# Patient Record
Sex: Male | Born: 1955 | Race: White | Hispanic: No | Marital: Married | Smoking: Former smoker
Health system: Southern US, Community
[De-identification: ages and names within clinical notes are randomized; demographics above are authoritative.]

## PROBLEM LIST (undated history)

## (undated) DIAGNOSIS — F32A Depression, unspecified: Secondary | ICD-10-CM

## (undated) DIAGNOSIS — Z87891 Personal history of nicotine dependence: Secondary | ICD-10-CM

## (undated) DIAGNOSIS — Z8719 Personal history of other diseases of the digestive system: Secondary | ICD-10-CM

## (undated) DIAGNOSIS — M1990 Unspecified osteoarthritis, unspecified site: Secondary | ICD-10-CM

## (undated) DIAGNOSIS — C44622 Squamous cell carcinoma of skin of right upper limb, including shoulder: Secondary | ICD-10-CM

## (undated) DIAGNOSIS — K219 Gastro-esophageal reflux disease without esophagitis: Secondary | ICD-10-CM

## (undated) DIAGNOSIS — E119 Type 2 diabetes mellitus without complications: Secondary | ICD-10-CM

## (undated) DIAGNOSIS — F329 Major depressive disorder, single episode, unspecified: Secondary | ICD-10-CM

## (undated) DIAGNOSIS — Z87442 Personal history of urinary calculi: Secondary | ICD-10-CM

## (undated) DIAGNOSIS — I779 Disorder of arteries and arterioles, unspecified: Secondary | ICD-10-CM

## (undated) DIAGNOSIS — E785 Hyperlipidemia, unspecified: Secondary | ICD-10-CM

## (undated) DIAGNOSIS — K2270 Barrett's esophagus without dysplasia: Secondary | ICD-10-CM

## (undated) DIAGNOSIS — G4730 Sleep apnea, unspecified: Secondary | ICD-10-CM

## (undated) DIAGNOSIS — I10 Essential (primary) hypertension: Secondary | ICD-10-CM

## (undated) DIAGNOSIS — I2510 Atherosclerotic heart disease of native coronary artery without angina pectoris: Secondary | ICD-10-CM

## (undated) DIAGNOSIS — C44229 Squamous cell carcinoma of skin of left ear and external auricular canal: Secondary | ICD-10-CM

## (undated) DIAGNOSIS — I209 Angina pectoris, unspecified: Secondary | ICD-10-CM

## (undated) DIAGNOSIS — M6208 Separation of muscle (nontraumatic), other site: Secondary | ICD-10-CM

## (undated) HISTORY — PX: CARDIAC CATHETERIZATION: SHX172

## (undated) HISTORY — DX: Hyperlipidemia, unspecified: E78.5

## (undated) HISTORY — DX: Squamous cell carcinoma of skin of right upper limb, including shoulder: C44.622

## (undated) HISTORY — PX: OTHER SURGICAL HISTORY: SHX169

## (undated) HISTORY — DX: Personal history of nicotine dependence: Z87.891

## (undated) HISTORY — PX: CORONARY ANGIOPLASTY: SHX604

## (undated) HISTORY — DX: Morbid (severe) obesity due to excess calories: E66.01

## (undated) HISTORY — DX: Disorder of arteries and arterioles, unspecified: I77.9

## (undated) HISTORY — DX: Unspecified osteoarthritis, unspecified site: M19.90

## (undated) HISTORY — DX: Squamous cell carcinoma of skin of left ear and external auricular canal: C44.229

---

## 1989-10-17 HISTORY — PX: BACK SURGERY: SHX140

## 2008-10-17 HISTORY — PX: OTHER SURGICAL HISTORY: SHX169

## 2009-10-17 HISTORY — PX: CAROTID ARTERY ANGIOPLASTY: SHX1300

## 2017-10-24 ENCOUNTER — Telehealth: Payer: Self-pay

## 2017-10-24 NOTE — Telephone Encounter (Signed)
Patient has upcoming appt 1-29 with Dr. Rockey Situ. Signed ROI for old cardio records in tx.   Faxed to office scanned ROI.

## 2017-10-24 NOTE — Telephone Encounter (Signed)
Thank you :)

## 2017-11-06 DIAGNOSIS — I2510 Atherosclerotic heart disease of native coronary artery without angina pectoris: Secondary | ICD-10-CM | POA: Insufficient documentation

## 2017-11-06 DIAGNOSIS — I779 Disorder of arteries and arterioles, unspecified: Secondary | ICD-10-CM | POA: Insufficient documentation

## 2017-11-06 DIAGNOSIS — M1990 Unspecified osteoarthritis, unspecified site: Secondary | ICD-10-CM | POA: Insufficient documentation

## 2017-11-06 DIAGNOSIS — F329 Major depressive disorder, single episode, unspecified: Secondary | ICD-10-CM | POA: Insufficient documentation

## 2017-11-12 NOTE — Progress Notes (Signed)
Cardiology Office Note  Date:  11/14/2017   ID:  Brandon Henderson, DOB 09/15/1956, MRN [id number]  PCP:  Brandon Ruths, MD   Chief Complaint  Patient presents with  . New Patient (Initial Visit) consultation by Brandon Henderson    Patient c/o Swelling in Hands and left arm and hand numbness and tingling.  Patient denies chest pain and SOB. Meds revewed verbally with patient.     HPI:  Brandon Henderson is a 62 year old gentleman with past medical history of carotid disease, carotid endarterectomy surgery, on the right 2011 Coronary artery disease previous PCI, 2000 [facility], PCI OM2 vessel Depression Osteoarthritis Snoring, witnessed apnea per the wife Former smoker, quit 3 yrs ago,  Borderline diabetes Who presents by referral from Brandon Henderson for consultation of his coronary artery disease  Long discussion concerning his previous cardiac history Previous stress tests,   Discussed previous anginal symptoms in 2000, had left-sided chest pain Resolved after stent placed to OM 2 vessel  Had carotid endarterectomy surgery in 6712, no complications  Currently denies any chest pain on exertion, active at work Main complaint is tingling numbness left arm after day of repetitive movement at work lifting heavy items Thinks he may have nerve compression and neck  Lab work reviewed with him total cholesterol 177, LDL 89 in August 2018 Diet hit or miss HBA1C 5.9  EKG personally reviewed by myself on todays visit Shows normal sinus rhythm with rate 61 bpm no significant ST or T wave changes   PMH:   has no past medical history on file.  PSH:    Past Surgical History:  Procedure Laterality Date  . BACK SURGERY  1991  . CAROTID ARTERY ANGIOPLASTY  2011  . Rotator cuff  2010    Current Outpatient Medications  Medication Sig Dispense Refill  . aspirin EC 81 MG tablet Take 81 mg by mouth daily.    Marland Kitchen atorvastatin (LIPITOR) 40 MG tablet Take 1 tablet (40 mg total) by  mouth daily. 90 tablet 3  . escitalopram (LEXAPRO) 20 MG tablet Take 20 mg by mouth daily.    Marland Kitchen glucosamine-chondroitin 500-400 MG tablet Take 1 tablet by mouth 2 (two) times daily.    . magnesium oxide (MAG-OX) 400 MG tablet Take 500 mg by mouth 2 (two) times daily.    . meloxicam (MOBIC) 15 MG tablet Take 15 mg by mouth daily as needed.  3  . ramipril (ALTACE) 5 MG capsule Take 1 capsule (5 mg total) by mouth daily. 90 capsule 3  . ezetimibe (ZETIA) 10 MG tablet Take 1 tablet (10 mg total) by mouth daily. 90 tablet 3   No current facility-administered medications for this visit.      Allergies:   Patient has no allergy information on record.   Social History:  The patient  reports that he has quit smoking. he has never used smokeless tobacco. He reports that he drinks alcohol. He reports that he does not use drugs.   Family History:   family history is not on file.    Review of Systems: Review of Systems  Constitutional: Negative.   Respiratory: Negative.   Cardiovascular: Negative.   Gastrointestinal: Negative.   Musculoskeletal: Negative.        Left arm tingling and discomfort  Neurological: Negative.   Psychiatric/Behavioral: Negative.   All other systems reviewed and are negative.    PHYSICAL EXAM: VS:  BP 120/74 (BP Location: Left Arm, Patient Position: Sitting, Cuff Size: Normal)  Pulse 61   Ht 5\' 7"  (1.702 m)   Wt 234 lb (106.1 kg)   BMI 36.65 kg/m  , BMI Body mass index is 36.65 kg/m. GEN: Well nourished, well developed, in no acute distress obese,  HEENT: normal  Neck: no JVD, carotid bruits, or masses Cardiac: RRR; no murmurs, rubs, or gallops,no edema  Respiratory:  clear to auscultation bilaterally, normal work of breathing GI: soft, nontender, nondistended, + BS MS: no deformity or atrophy  Skin: warm and dry, no rash Neuro:  Strength and sensation are intact Psych: euthymic mood, full affect    Recent Labs: No results found for requested labs  within last 8760 hours.    Lipid Panel No results found for: CHOL, HDL, LDLCALC, TRIG    Wt Readings from Last 3 Encounters:  11/14/17 234 lb (106.1 kg)       ASSESSMENT AND PLAN:  Coronary artery disease of native artery of native heart with stable angina pectoris [facility]) Records reviewed with him in detailCurrently with no symptoms of angina. No further workup at this time. Continue current medication regimen.  Stable  Stenosis of right carotid artery - Plan: EKG 12-Lead In follow-up will help arrange carotid ultrasound Stable disease following carotid endarterectomy in 2000  Mixed hyperlipidemia Numbers above goal 177 cholesterol August 2018 Recommend he stay on Lipitor and start Zetia 10 mg daily  Morbid obesity ([place]) We have encouraged continued exercise, careful diet management in an effort to lose weight.  Elevated glucose Recommended low carbohydrate diet, weight loss, exercise  PAD (peripheral artery disease) (HCC) Carotid disease, stressed importance of weight loss, low sugars, LDL less than 70 if not lower   Total encounter time more than 60 minutes  Greater than 50% was spent in counseling and coordination of care with the patient   Disposition:   F/U  6 months   Orders Placed This Encounter  Procedures  . EKG 12-Lead     Signed, Esmond Plants, M.D., Ph.D. 11/14/2017  [place], [place]

## 2017-11-14 ENCOUNTER — Encounter: Payer: Self-pay | Admitting: Cardiovascular Disease

## 2017-11-14 ENCOUNTER — Ambulatory Visit: Payer: BLUE CROSS/BLUE SHIELD | Admitting: Cardiovascular Disease

## 2017-11-14 VITALS — BP 120/74 | HR 61 | Ht 67.0 in | Wt 234.0 lb

## 2017-11-14 DIAGNOSIS — Z23 Encounter for immunization: Secondary | ICD-10-CM

## 2017-11-14 DIAGNOSIS — I25118 Atherosclerotic heart disease of native coronary artery with other forms of angina pectoris: Secondary | ICD-10-CM

## 2017-11-14 DIAGNOSIS — I739 Peripheral vascular disease, unspecified: Secondary | ICD-10-CM | POA: Diagnosis not present

## 2017-11-14 DIAGNOSIS — E782 Mixed hyperlipidemia: Secondary | ICD-10-CM | POA: Diagnosis not present

## 2017-11-14 DIAGNOSIS — R7309 Other abnormal glucose: Secondary | ICD-10-CM | POA: Diagnosis not present

## 2017-11-14 DIAGNOSIS — I6521 Occlusion and stenosis of right carotid artery: Secondary | ICD-10-CM

## 2017-11-14 DIAGNOSIS — I2510 Atherosclerotic heart disease of native coronary artery without angina pectoris: Secondary | ICD-10-CM | POA: Insufficient documentation

## 2017-11-14 MED ORDER — EZETIMIBE 10 MG PO TABS: 10.0000 mg | ORAL_TABLET | Freq: Every day | ORAL | 3 refills | Status: DC

## 2017-11-14 MED ORDER — ATORVASTATIN CALCIUM 40 MG PO TABS: 40.0000 mg | ORAL_TABLET | Freq: Every day | ORAL | 3 refills | Status: DC

## 2017-11-14 MED ORDER — RAMIPRIL 5 MG PO CAPS: 5.0000 mg | ORAL_CAPSULE | Freq: Every day | ORAL | 3 refills | Status: DC

## 2017-11-14 NOTE — Patient Instructions (Signed)
Medication Instructions:   Please start zetia one a day for cholesterol  Labwork:  No new labs needed  Testing/Procedures:  No further testing at this time   Follow-Up: It was a pleasure seeing you in the office today. Please call us if you have new issues that need to be addressed before your next appt.  [phone]  Your physician wants you to follow-up in: 6 months.  You will receive a reminder letter in the mail two months in advance. If you don't receive a letter, please call our office to schedule the follow-up appointment.  If you need a refill on your cardiac medications before your next appointment, please call your pharmacy.

## 2017-11-23 DIAGNOSIS — M25561 Pain in right knee: Secondary | ICD-10-CM | POA: Diagnosis not present

## 2017-11-23 DIAGNOSIS — G8929 Other chronic pain: Secondary | ICD-10-CM | POA: Diagnosis not present

## 2017-11-23 DIAGNOSIS — M25512 Pain in left shoulder: Secondary | ICD-10-CM | POA: Diagnosis not present

## 2017-12-22 DIAGNOSIS — E119 Type 2 diabetes mellitus without complications: Secondary | ICD-10-CM | POA: Insufficient documentation

## 2017-12-22 DIAGNOSIS — R252 Cramp and spasm: Secondary | ICD-10-CM | POA: Diagnosis not present

## 2017-12-22 DIAGNOSIS — M545 Low back pain: Secondary | ICD-10-CM | POA: Diagnosis not present

## 2017-12-22 DIAGNOSIS — R739 Hyperglycemia, unspecified: Secondary | ICD-10-CM | POA: Diagnosis not present

## 2017-12-22 DIAGNOSIS — R5383 Other fatigue: Secondary | ICD-10-CM | POA: Diagnosis not present

## 2018-01-04 DIAGNOSIS — R0683 Snoring: Secondary | ICD-10-CM | POA: Diagnosis not present

## 2018-01-04 DIAGNOSIS — G4710 Hypersomnia, unspecified: Secondary | ICD-10-CM | POA: Diagnosis not present

## 2018-01-06 DIAGNOSIS — G4733 Obstructive sleep apnea (adult) (pediatric): Secondary | ICD-10-CM | POA: Diagnosis not present

## 2018-03-22 DIAGNOSIS — D0439 Carcinoma in situ of skin of other parts of face: Secondary | ICD-10-CM | POA: Diagnosis not present

## 2018-03-22 DIAGNOSIS — D225 Melanocytic nevi of trunk: Secondary | ICD-10-CM | POA: Diagnosis not present

## 2018-03-22 DIAGNOSIS — D2262 Melanocytic nevi of left upper limb, including shoulder: Secondary | ICD-10-CM | POA: Diagnosis not present

## 2018-03-22 DIAGNOSIS — D485 Neoplasm of uncertain behavior of skin: Secondary | ICD-10-CM | POA: Diagnosis not present

## 2018-03-22 DIAGNOSIS — D2261 Melanocytic nevi of right upper limb, including shoulder: Secondary | ICD-10-CM | POA: Diagnosis not present

## 2018-03-22 DIAGNOSIS — D2272 Melanocytic nevi of left lower limb, including hip: Secondary | ICD-10-CM | POA: Diagnosis not present

## 2018-03-28 DIAGNOSIS — G8929 Other chronic pain: Secondary | ICD-10-CM | POA: Diagnosis not present

## 2018-03-28 DIAGNOSIS — M25561 Pain in right knee: Secondary | ICD-10-CM | POA: Diagnosis not present

## 2018-03-28 DIAGNOSIS — M6208 Separation of muscle (nontraumatic), other site: Secondary | ICD-10-CM | POA: Diagnosis not present

## 2018-03-28 DIAGNOSIS — Z1211 Encounter for screening for malignant neoplasm of colon: Secondary | ICD-10-CM | POA: Diagnosis not present

## 2018-04-11 DIAGNOSIS — J40 Bronchitis, not specified as acute or chronic: Secondary | ICD-10-CM | POA: Diagnosis not present

## 2018-04-27 DIAGNOSIS — Z800 Family history of malignant neoplasm of digestive organs: Secondary | ICD-10-CM | POA: Diagnosis not present

## 2018-04-27 DIAGNOSIS — R1013 Epigastric pain: Secondary | ICD-10-CM | POA: Diagnosis not present

## 2018-04-27 DIAGNOSIS — K219 Gastro-esophageal reflux disease without esophagitis: Secondary | ICD-10-CM | POA: Diagnosis not present

## 2018-06-11 ENCOUNTER — Encounter: Payer: Self-pay | Admitting: *Deleted

## 2018-06-12 ENCOUNTER — Ambulatory Visit
Admission: RE | Admit: 2018-06-12 | Discharge: 2018-06-12 | Disposition: A | Discharge status: Home / Self Care | Payer: BLUE CROSS/BLUE SHIELD | Source: Ambulatory Visit | Attending: Internal Medicine | Admitting: Internal Medicine

## 2018-06-12 ENCOUNTER — Encounter
Admission: RE | Disposition: A | Discharge status: Home / Self Care | Payer: Self-pay | Source: Ambulatory Visit | Attending: Internal Medicine

## 2018-06-12 ENCOUNTER — Ambulatory Visit: Payer: BLUE CROSS/BLUE SHIELD | Admitting: Anesthesiology

## 2018-06-12 ENCOUNTER — Encounter: Payer: Self-pay | Admitting: *Deleted

## 2018-06-12 DIAGNOSIS — I2510 Atherosclerotic heart disease of native coronary artery without angina pectoris: Secondary | ICD-10-CM | POA: Insufficient documentation

## 2018-06-12 DIAGNOSIS — G4730 Sleep apnea, unspecified: Secondary | ICD-10-CM | POA: Insufficient documentation

## 2018-06-12 DIAGNOSIS — K3189 Other diseases of stomach and duodenum: Secondary | ICD-10-CM | POA: Diagnosis not present

## 2018-06-12 DIAGNOSIS — K5730 Diverticulosis of large intestine without perforation or abscess without bleeding: Secondary | ICD-10-CM | POA: Diagnosis not present

## 2018-06-12 DIAGNOSIS — Z7982 Long term (current) use of aspirin: Secondary | ICD-10-CM | POA: Insufficient documentation

## 2018-06-12 DIAGNOSIS — K210 Gastro-esophageal reflux disease with esophagitis: Secondary | ICD-10-CM | POA: Insufficient documentation

## 2018-06-12 DIAGNOSIS — Z87891 Personal history of nicotine dependence: Secondary | ICD-10-CM | POA: Diagnosis not present

## 2018-06-12 DIAGNOSIS — R1013 Epigastric pain: Secondary | ICD-10-CM | POA: Diagnosis not present

## 2018-06-12 DIAGNOSIS — K9289 Other specified diseases of the digestive system: Secondary | ICD-10-CM | POA: Diagnosis not present

## 2018-06-12 DIAGNOSIS — K219 Gastro-esophageal reflux disease without esophagitis: Secondary | ICD-10-CM | POA: Diagnosis present

## 2018-06-12 DIAGNOSIS — Z1211 Encounter for screening for malignant neoplasm of colon: Secondary | ICD-10-CM | POA: Insufficient documentation

## 2018-06-12 DIAGNOSIS — Z800 Family history of malignant neoplasm of digestive organs: Secondary | ICD-10-CM | POA: Insufficient documentation

## 2018-06-12 DIAGNOSIS — Z79899 Other long term (current) drug therapy: Secondary | ICD-10-CM | POA: Insufficient documentation

## 2018-06-12 DIAGNOSIS — K2970 Gastritis, unspecified, without bleeding: Secondary | ICD-10-CM | POA: Diagnosis not present

## 2018-06-12 DIAGNOSIS — K449 Diaphragmatic hernia without obstruction or gangrene: Secondary | ICD-10-CM | POA: Diagnosis not present

## 2018-06-12 DIAGNOSIS — K2960 Other gastritis without bleeding: Secondary | ICD-10-CM | POA: Diagnosis not present

## 2018-06-12 DIAGNOSIS — K228 Other specified diseases of esophagus: Secondary | ICD-10-CM | POA: Insufficient documentation

## 2018-06-12 DIAGNOSIS — F329 Major depressive disorder, single episode, unspecified: Secondary | ICD-10-CM | POA: Insufficient documentation

## 2018-06-12 DIAGNOSIS — Z791 Long term (current) use of non-steroidal anti-inflammatories (NSAID): Secondary | ICD-10-CM | POA: Insufficient documentation

## 2018-06-12 DIAGNOSIS — I10 Essential (primary) hypertension: Secondary | ICD-10-CM | POA: Diagnosis not present

## 2018-06-12 DIAGNOSIS — E119 Type 2 diabetes mellitus without complications: Secondary | ICD-10-CM | POA: Diagnosis not present

## 2018-06-12 DIAGNOSIS — K641 Second degree hemorrhoids: Secondary | ICD-10-CM | POA: Insufficient documentation

## 2018-06-12 HISTORY — DX: Depression, unspecified: F32.A

## 2018-06-12 HISTORY — PX: ESOPHAGOGASTRODUODENOSCOPY (EGD) WITH PROPOFOL: SHX5813

## 2018-06-12 HISTORY — DX: Sleep apnea, unspecified: G47.30

## 2018-06-12 HISTORY — DX: Type 2 diabetes mellitus without complications: E11.9

## 2018-06-12 HISTORY — DX: Essential (primary) hypertension: I10

## 2018-06-12 HISTORY — DX: Unspecified osteoarthritis, unspecified site: M19.90

## 2018-06-12 HISTORY — DX: Atherosclerotic heart disease of native coronary artery without angina pectoris: I25.10

## 2018-06-12 HISTORY — PX: COLONOSCOPY WITH PROPOFOL: SHX5780

## 2018-06-12 HISTORY — DX: Major depressive disorder, single episode, unspecified: F32.9

## 2018-06-12 SURGERY — ESOPHAGOGASTRODUODENOSCOPY (EGD) WITH PROPOFOL
Anesthesia: General

## 2018-06-12 MED ORDER — LIDOCAINE HCL (PF) 2 % IJ SOLN
INTRAMUSCULAR | Status: AC
  Filled 2018-06-12: qty 10

## 2018-06-12 MED ORDER — FENTANYL CITRATE (PF) 100 MCG/2ML IJ SOLN
INTRAMUSCULAR | Status: AC
  Filled 2018-06-12: qty 2

## 2018-06-12 MED ORDER — PROPOFOL 500 MG/50ML IV EMUL
INTRAVENOUS | Status: DC | PRN
  Administered 2018-06-12: 140 ug/kg/min via INTRAVENOUS

## 2018-06-12 MED ORDER — MIDAZOLAM HCL 2 MG/2ML IJ SOLN
INTRAMUSCULAR | Status: AC
  Filled 2018-06-12: qty 2

## 2018-06-12 MED ORDER — MIDAZOLAM HCL 2 MG/2ML IJ SOLN
INTRAMUSCULAR | Status: DC | PRN
  Administered 2018-06-12: 2 mg via INTRAVENOUS

## 2018-06-12 MED ORDER — LIDOCAINE HCL (CARDIAC) PF 100 MG/5ML IV SOSY
PREFILLED_SYRINGE | INTRAVENOUS | Status: DC | PRN
  Administered 2018-06-12: 30 mg via INTRAVENOUS

## 2018-06-12 MED ORDER — EPHEDRINE SULFATE 50 MG/ML IJ SOLN
INTRAMUSCULAR | Status: AC
  Filled 2018-06-12: qty 1

## 2018-06-12 MED ORDER — PROPOFOL 500 MG/50ML IV EMUL
INTRAVENOUS | Status: AC
  Filled 2018-06-12: qty 50

## 2018-06-12 MED ORDER — SODIUM CHLORIDE 0.9 % IV SOLN
INTRAVENOUS | Status: DC
  Administered 2018-06-12: 1000 mL via INTRAVENOUS

## 2018-06-12 MED ORDER — FENTANYL CITRATE (PF) 100 MCG/2ML IJ SOLN
INTRAMUSCULAR | Status: DC | PRN
  Administered 2018-06-12: 50 ug via INTRAVENOUS

## 2018-06-12 MED ORDER — EPHEDRINE SULFATE 50 MG/ML IJ SOLN
INTRAMUSCULAR | Status: DC | PRN
  Administered 2018-06-12: 10 mg via INTRAVENOUS

## 2018-06-12 NOTE — Anesthesia Procedure Notes (Signed)
Performed by: Cook-Martin, Devontay Celaya Pre-anesthesia Checklist: Patient identified, Emergency Drugs available, Suction available, Patient being monitored and Timeout performed Patient Re-evaluated:Patient Re-evaluated prior to induction Oxygen Delivery Method: Nasal cannula Preoxygenation: Pre-oxygenation with 100% oxygen Induction Type: IV induction Placement Confirmation: positive ETCO2 and CO2 detector       

## 2018-06-12 NOTE — Anesthesia Preprocedure Evaluation (Addendum)
Anesthesia Evaluation  Patient identified by MRN, date of birth, ID band Patient awake    Reviewed: Allergy & Precautions, NPO status , Patient's Chart, lab work & pertinent test results, reviewed documented beta blocker date and time   Airway Mallampati: III  TM Distance: >3 FB     Dental  (+) Chipped, Upper Dentures   Pulmonary sleep apnea , former smoker,           Cardiovascular hypertension, Pt. on medications + CAD       Neuro/Psych PSYCHIATRIC DISORDERS Depression    GI/Hepatic   Endo/Other  diabetes, Type 2  Renal/GU      Musculoskeletal  (+) Arthritis ,   Abdominal   Peds  Hematology   Anesthesia Other Findings Obese.  Reproductive/Obstetrics                            Anesthesia Physical Anesthesia Plan  ASA: III  Anesthesia Plan: General   Post-op Pain Management:    Induction: Intravenous  PONV Risk Score and Plan:   Airway Management Planned:   Additional Equipment:   Intra-op Plan:   Post-operative Plan:   Informed Consent: I have reviewed the patients History and Physical, chart, labs and discussed the procedure including the risks, benefits and alternatives for the proposed anesthesia with the patient or authorized representative who has indicated his/her understanding and acceptance.     Plan Discussed with: CRNA  Anesthesia Plan Comments:         Anesthesia Quick Evaluation

## 2018-06-12 NOTE — Transfer of Care (Signed)
Immediate Anesthesia Transfer of Care Note  Patient: Brandon Henderson  Procedure(s) Performed: ESOPHAGOGASTRODUODENOSCOPY (EGD) WITH PROPOFOL (N/A ) COLONOSCOPY WITH PROPOFOL (N/A )  Patient Location: PACU  Anesthesia Type:General  Level of Consciousness: awake and sedated  Airway & Oxygen Therapy: Patient Spontanous Breathing and Patient connected to nasal cannula oxygen  Post-op Assessment: Report given to RN and Post -op Vital signs reviewed and stable  Post vital signs: Reviewed and stable  Last Vitals:  Vitals Value Taken Time  BP    Temp    Pulse    Resp    SpO2      Last Pain:  Vitals:   06/12/18 0750  TempSrc: Tympanic  PainSc: 0-No pain         Complications: No apparent anesthesia complications

## 2018-06-12 NOTE — Op Note (Signed)
[facility] Gastroenterology Patient Name: Brandon Henderson Procedure Date: 06/12/2018 8:21 AM MRN: [id number] Account #: [id number] Date of Birth: [birth year] Admit Type: Outpatient Age: 62 Room: [facility] ENDO ROOM 3 Gender: Male Note Status: Finalized Procedure:            Colonoscopy Indications:          Screening in patient at increased risk: Family history                        of 1st-degree relative with colorectal cancer Providers:            Benay Pike. Alice Reichert MD, MD Referring MD:         Marney Setting. Ouida Sills MD, MD (Referring MD), Ocie Cornfield.                        Ouida Sills MD, MD (Referring MD) Medicines:            Propofol per Anesthesia Complications:        No immediate complications. Procedure:            Pre-Anesthesia Assessment:                       - The risks and benefits of the procedure and the                        sedation options and risks were discussed with the                        patient. All questions were answered and informed                        consent was obtained.                       - Patient identification and proposed procedure were                        verified prior to the procedure by the nurse. The                        procedure was verified in the procedure room.                       - ASA Grade Assessment: III - A patient with severe                        systemic disease.                       - After reviewing the risks and benefits, the patient                        was deemed in satisfactory condition to undergo the                        procedure.                       After obtaining informed consent, the colonoscope was  passed under direct vision. Throughout the procedure,                        the patient's blood pressure, pulse, and oxygen                        saturations were monitored continuously. The                        Colonoscope was introduced through the anus and                    advanced to the the cecum, identified by appendiceal                        orifice and ileocecal valve. The colonoscopy was                        performed without difficulty. The patient tolerated the                        procedure well. The quality of the bowel preparation                        was good. The ileocecal valve, appendiceal orifice, and                        rectum were photographed. Findings:      The perianal and digital rectal examinations were normal. Pertinent       negatives include normal sphincter tone and no palpable rectal lesions.      Multiple small and large-mouthed diverticula were found in the left       colon.      Non-bleeding internal hemorrhoids were found during retroflexion. The       hemorrhoids were Grade II (internal hemorrhoids that prolapse but reduce       spontaneously).      The exam was otherwise without abnormality. Impression:           - Diverticulosis in the left colon.                       - Non-bleeding internal hemorrhoids.                       - The examination was otherwise normal.                       - No specimens collected. Recommendation:       - Patient has a contact number available for                        emergencies. The signs and symptoms of potential                        delayed complications were discussed with the patient.                        Return to normal activities tomorrow. Written discharge                        instructions were provided to the  patient.                       - Resume previous diet.                       - Continue present medications.                       - Repeat colonoscopy in 5 years for screening purposes.                       - Await pathology results from EGD, also performed                        today.                       - Return to my office in 3 months.                       - The findings and recommendations were discussed with                         the patient and their spouse. Procedure Code(s):    --- Professional ---                       T2549, Colorectal cancer screening; colonoscopy on                        individual at high risk Diagnosis Code(s):    --- Professional ---                       K57.30, Diverticulosis of large intestine without                        perforation or abscess without bleeding                       K64.1, Second degree hemorrhoids                       Z80.0, Family history of malignant neoplasm of                        digestive organs CPT copyright 2017 American Medical Association. All rights reserved. The codes documented in this report are preliminary and upon coder review may  be revised to meet current compliance requirements. Efrain Sella MD, MD 06/12/2018 9:00:08 AM This report has been signed electronically. Number of Addenda: 0 Note Initiated On: 06/12/2018 8:21 AM Scope Withdrawal Time: 0 hours 6 minutes 4 seconds  Total Procedure Duration: 0 hours 12 minutes 56 seconds       [facility]

## 2018-06-12 NOTE — Anesthesia Post-op Follow-up Note (Signed)
Anesthesia QCDR form completed.        

## 2018-06-12 NOTE — H&P (Signed)
Outpatient short stay form Pre-procedure 06/12/2018 8:10 AM Brandon Henderson, M.D.  Primary Physician: Frazier Richards, M.D.  Reason for visit:  Family hx of colon cancer, Epigastric pain, GERD  History of present illness: As above. Epigastric pain worsened by eating, No nausea or vomiting. Has early satiety as well. 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 No change in bowel habits or rectal bleeding.     Current Facility-Administered Medications:  .  0.9 %  sodium chloride infusion, , Intravenous, Continuous, [place], Benay Pike, MD, Last Rate: 20 mL/hr at 06/12/18 0810, 1,000 mL at 06/12/18 0810  Medications Prior to Admission  Medication Sig Dispense Refill Last Dose  . aspirin EC 81 MG tablet Take 81 mg by mouth daily.   Past Week at Unknown time  . atorvastatin (LIPITOR) 40 MG tablet Take 1 tablet (40 mg total) by mouth daily. 90 tablet 3 06/11/2018 at Unknown time  . escitalopram (LEXAPRO) 20 MG tablet Take 20 mg by mouth daily.   06/11/2018 at Unknown time  . ezetimibe (ZETIA) 10 MG tablet Take 1 tablet (10 mg total) by mouth daily. 90 tablet 3 06/11/2018 at Unknown time  . glucosamine-chondroitin 500-400 MG tablet Take 1 tablet by mouth 2 (two) times daily.   06/11/2018 at Unknown time  . magnesium oxide (MAG-OX) 400 MG tablet Take 500 mg by mouth 2 (two) times daily.   06/11/2018 at Unknown time  . meloxicam (MOBIC) 15 MG tablet Take 15 mg by mouth daily as needed.  3 06/11/2018 at Unknown time  . pantoprazole (PROTONIX) 40 MG tablet Take 40 mg by mouth daily.   06/11/2018 at Unknown time  . ramipril (ALTACE) 5 MG capsule Take 1 capsule (5 mg total) by mouth daily. 90 capsule 3 06/11/2018 at Unknown time     No Known Allergies   Past Medical History:  Diagnosis Date  . Arthritis   . Coronary artery disease   . Depression   . Diabetes mellitus without complication ([place])   . Hypertension   . Sleep apnea     Review of systems:  Otherwise negative.    Physical Exam  Gen: Alert, oriented.  Appears stated age.  HEENT: [place]/AT. PERRLA. Lungs: CTA, no wheezes. CV: RR nl S1, S2. Abd: soft, benign, no masses. BS+ Ext: No edema. Pulses 2+    Planned procedures: Proceed with colonoscopy. The patient understands the nature of the planned procedure, indications, risks, alternatives and potential complications including but not limited to bleeding, infection, perforation, damage to internal organs and possible oversedation/side effects from anesthesia. The patient agrees and gives consent to proceed.  Please refer to procedure notes for findings, recommendations and patient disposition/instructions.     Brandon Henderson, M.D. Gastroenterology 06/12/2018  8:10 AM

## 2018-06-12 NOTE — Interval H&P Note (Signed)
History and Physical Interval Note:  06/12/2018 8:13 AM  Brandon Henderson  has presented today for surgery, with the diagnosis of EPIGASTRIC PAIN,FAMILY HX.OF COLON CANCER  The various methods of treatment have been discussed with the patient and family. After consideration of risks, benefits and other options for treatment, the patient has consented to  Procedure(s): ESOPHAGOGASTRODUODENOSCOPY (EGD) WITH PROPOFOL (N/A) COLONOSCOPY WITH PROPOFOL (N/A) as a surgical intervention .  The patient's history has been reviewed, patient examined, no change in status, stable for surgery.  I have reviewed the patient's chart and labs.  Questions were answered to the patient's satisfaction.     [place], [place]

## 2018-06-12 NOTE — Anesthesia Postprocedure Evaluation (Signed)
Anesthesia Post Note  Patient: Brandon Henderson  Procedure(s) Performed: ESOPHAGOGASTRODUODENOSCOPY (EGD) WITH PROPOFOL (N/A ) COLONOSCOPY WITH PROPOFOL (N/A )  Patient location during evaluation: Endoscopy Anesthesia Type: General Level of consciousness: awake and alert Pain management: pain level controlled Vital Signs Assessment: post-procedure vital signs reviewed and stable Respiratory status: spontaneous breathing, nonlabored ventilation, respiratory function stable and patient connected to nasal cannula oxygen Cardiovascular status: blood pressure returned to baseline and stable Postop Assessment: no apparent nausea or vomiting Anesthetic complications: no     Last Vitals:  Vitals:   06/12/18 0916 06/12/18 0936  BP: 114/70 123/71  Pulse: 66 62  Resp: 18 15  Temp:    SpO2: 95% 97%    Last Pain:  Vitals:   06/12/18 0936  TempSrc:   PainSc: 0-No pain                 Adea Geisel S

## 2018-06-12 NOTE — Op Note (Signed)
[facility] Gastroenterology Patient Name: Brandon Henderson Procedure Date: 06/12/2018 8:21 AM MRN: [id number] Account #: [id number] Date of Birth: 1956/09/18 Admit Type: Outpatient Age: 62 Room: [facility] ENDO ROOM 3 Gender: Male Note Status: Finalized Procedure:            Upper GI endoscopy Indications:          Epigastric abdominal pain, Esophageal reflux Providers:            Benay Pike. Alice Reichert MD, MD Referring MD:         Ocie Cornfield. Ouida Sills MD, MD (Referring MD) Medicines:            Propofol per Anesthesia Complications:        No immediate complications. Procedure:            Pre-Anesthesia Assessment:                       - The risks and benefits of the procedure and the                        sedation options and risks were discussed with the                        patient. All questions were answered and informed                        consent was obtained.                       - Patient identification and proposed procedure were                        verified prior to the procedure by the nurse. The                        procedure was verified in the procedure room.                       - ASA Grade Assessment: III - A patient with severe                        systemic disease.                       - After reviewing the risks and benefits, the patient                        was deemed in satisfactory condition to undergo the                        procedure.                       After obtaining informed consent, the endoscope was                        passed under direct vision. Throughout the procedure,                        the patient's blood pressure, pulse, and oxygen  saturations were monitored continuously. The Endoscope                        was introduced through the mouth, and advanced to the                        third part of duodenum. The upper GI endoscopy was                        accomplished without  difficulty. The patient tolerated                        the procedure well. Findings:      The Z-line was irregular and was found in the distal esophagus. Mucosa       was biopsied with a cold forceps for histology. One specimen bottle was       sent to pathology.      Striped mildly erythematous mucosa without bleeding was found in the       gastric antrum. Biopsies were taken with a cold forceps for Helicobacter       pylori testing.      A 1 cm hiatal hernia was present.      The examined duodenum was normal.      The exam was otherwise without abnormality. Impression:           - Z-line irregular, in the distal esophagus. Biopsied.                       - Erythematous mucosa in the antrum. Biopsied.                       - 1 cm hiatal hernia.                       - Normal examined duodenum.                       - The examination was otherwise normal. Recommendation:       - Await pathology results.                       - Proceed with colonoscopy Procedure Code(s):    --- Professional ---                       [phone], Esophagogastroduodenoscopy, flexible, transoral;                        with biopsy, single or multiple Diagnosis Code(s):    --- Professional ---                       K21.9, Gastro-esophageal reflux disease without                        esophagitis                       R10.13, Epigastric pain                       K44.9, Diaphragmatic hernia without obstruction or  gangrene                       K31.89, Other diseases of stomach and duodenum                       K22.8, Other specified diseases of esophagus CPT copyright 2017 American Medical Association. All rights reserved. The codes documented in this report are preliminary and upon coder review may  be revised to meet current compliance requirements. Efrain Sella MD, MD 06/12/2018 8:37:04 AM This report has been signed electronically. Number of Addenda: 0 Note Initiated On:  06/12/2018 8:21 AM      [place]

## 2018-06-13 LAB — SURGICAL PATHOLOGY

## 2018-06-14 ENCOUNTER — Encounter: Payer: Self-pay | Admitting: Internal Medicine

## 2018-06-20 DIAGNOSIS — D0439 Carcinoma in situ of skin of other parts of face: Secondary | ICD-10-CM | POA: Diagnosis not present

## 2018-07-19 ENCOUNTER — Ambulatory Visit: Payer: BLUE CROSS/BLUE SHIELD | Attending: Internal Medicine

## 2018-07-19 DIAGNOSIS — R400 Somnolence: Secondary | ICD-10-CM | POA: Diagnosis not present

## 2018-07-19 DIAGNOSIS — G4733 Obstructive sleep apnea (adult) (pediatric): Secondary | ICD-10-CM | POA: Diagnosis not present

## 2018-10-03 DIAGNOSIS — M1711 Unilateral primary osteoarthritis, right knee: Secondary | ICD-10-CM | POA: Diagnosis not present

## 2018-10-24 ENCOUNTER — Other Ambulatory Visit: Payer: Self-pay | Admitting: Cardiovascular Disease

## 2018-10-24 NOTE — Telephone Encounter (Signed)
Please contact pt OD for 6 month f/u last seen TG 10/2017.

## 2018-10-26 NOTE — Telephone Encounter (Signed)
LMOV to call and schedule follow up

## 2018-10-31 NOTE — Telephone Encounter (Signed)
Scheduled

## 2018-10-31 NOTE — Telephone Encounter (Signed)
lmov  To schedule

## 2018-12-18 NOTE — Progress Notes (Signed)
Cardiology Office Note  Date:  12/19/2018   ID:  Brandon Henderson, DOB 1956-02-05, MRN [id number]  PCP:  Brandon Ruths, MD   Chief Complaint  Patient presents with  . Other    Past due 6 month follow up. Patient c.o Swelling in legs. Patient denies chest pain and SOB at this time. Patient was last seen 11/14/2017. Meds reviewed verbally with patient.     HPI:  Brandon Henderson is a 63 year old gentleman with past medical history of Morbid obesity carotid disease, carotid endarterectomy surgery, on the right 2011 Coronary artery disease previous PCI, 2000 [facility], PCI OM2 vessel Depression Osteoarthritis Snoring, witnessed apnea per the wife Former smoker, quit 3 yrs ago,  Diabetes type II Who f/u for his coronary artery disease  In follow-up today weight is up 20 pounds over the past year Weight previously 234 pounds now up to 256 pounds Reports having chronic knee pain, ankle pain, no exercise Eating more at work High carbohydrate diet  Quit smoking started vaping Quit  Vaping, weight up  Reports having some shortness of breath, concern for coronary ischemia Requesting a stress test  Also like to have repeat carotid ultrasound given prior carotid endarterectomy  No recent lab work available Has not seen primary care to have lab work in the past year  Discussed previous anginal symptoms in 2000, had left-sided chest pain Resolved after stent placed to OM 2 vessel  EKG personally reviewed by myself on todays visit Shows normal sinus rhythm with rate 69 bpm no significant ST or T wave changes   PMH:   has a past medical history of Arthritis, Coronary artery disease, Depression, Diabetes mellitus without complication ([place]), Hypertension, and Sleep apnea.  PSH:    Past Surgical History:  Procedure Laterality Date  . artoscopic rotator cuff repair    . BACK SURGERY  1991  . CARDIAC CATHETERIZATION    . CAROTID ARTERY ANGIOPLASTY  2011  . carotid  endartarectomy    . COLONOSCOPY WITH PROPOFOL N/A 06/12/2018   Procedure: COLONOSCOPY WITH PROPOFOL;  Surgeon: Toledo, Benay Pike, MD;  Location: [facility] ENDOSCOPY;  Service: Gastroenterology;  Laterality: N/A;  . ESOPHAGOGASTRODUODENOSCOPY (EGD) WITH PROPOFOL N/A 06/12/2018   Procedure: ESOPHAGOGASTRODUODENOSCOPY (EGD) WITH PROPOFOL;  Surgeon: Toledo, Benay Pike, MD;  Location: [facility] ENDOSCOPY;  Service: Gastroenterology;  Laterality: N/A;  . Rotator cuff  2010    Current Outpatient Medications  Medication Sig Dispense Refill  . Ascorbic Acid (VITAMIN C) 1000 MG tablet Take 1,000 mg by mouth daily.    Marland Kitchen aspirin EC 81 MG tablet Take 81 mg by mouth daily.    Marland Kitchen atorvastatin (LIPITOR) 40 MG tablet TAKE 1 TABLET BY MOUTH ONCE A DAY 90 tablet 3  . Coenzyme Q10 (CO Q-10) 100 MG CAPS Take by mouth.    . escitalopram (LEXAPRO) 10 MG tablet Take 10 mg by mouth daily.     Marland Kitchen glucosamine-chondroitin 500-400 MG tablet Take 1 tablet by mouth daily.     Marland Kitchen MAGNESIUM OXIDE PO Take 100 mg by mouth daily.     . pantoprazole (PROTONIX) 40 MG tablet Take 40 mg by mouth daily.    . ramipril (ALTACE) 5 MG capsule TAKE 1 CAPSULE BY MOUTH ONCE DAILY 90 capsule 3   No current facility-administered medications for this visit.     Allergies:   Patient has no known allergies.   Social History:  The patient  reports that he has quit smoking. He has never used smokeless tobacco.  He reports current alcohol use. He reports that he does not use drugs.   Family History:   family history is not on file.    Review of Systems: Review of Systems  Constitutional: Negative.        Weight gain  Respiratory: Positive for shortness of breath.   Cardiovascular: Negative.   Gastrointestinal: Negative.   Musculoskeletal: Negative.   Neurological: Negative.   Psychiatric/Behavioral: Negative.   All other systems reviewed and are negative.   PHYSICAL EXAM: VS:  BP 140/82 (BP Location: Left Arm, Patient Position: Sitting, Cuff  Size: Normal)   Pulse 69   Wt 256 lb 8 oz (116.3 kg)   BMI 40.17 kg/m  , BMI Body mass index is 40.17 kg/m. GEN: Well nourished, well developed, in no acute distress, obese HEENT: normal  Neck: no JVD, carotid bruits, or masses Cardiac: RRR; no murmurs, rubs, or gallops,no edema  Respiratory:  clear to auscultation bilaterally, normal work of breathing GI: soft, nontender, nondistended, + BS MS: no deformity or atrophy  Skin: warm and dry, no rash Neuro:  Strength and sensation are intact Psych: euthymic mood, full affect   Recent Labs: No results found for requested labs within last 8760 hours.    Lipid Panel No results found for: CHOL, HDL, LDLCALC, TRIG    Wt Readings from Last 3 Encounters:  12/19/18 256 lb 8 oz (116.3 kg)  06/12/18 232 lb (105.2 kg)  11/14/17 234 lb (106.1 kg)      ASSESSMENT AND PLAN:  Coronary artery disease of native artery of native heart with stable angina pectoris ([place]) He reports having some shortness of breath, occasional chest tightness, he is concerned for coronary ischemia.  Unable to treadmill given deconditioning, chronic knee osteoarthritis and morbid obesity We have ordered a pharmacologic Myoview  Stenosis of right carotid artery - Plan: EKG 12-Lead  carotid endarterectomy in 2000 We will order repeat carotid ultrasound  Mixed hyperlipidemia We have ordered fasting lab work He is not on Zetia by choice, only on a statin  goal LDL less than 70  Morbid obesity ([place]) We have encouraged continued exercise, careful diet management in an effort to lose weight.  Diabetes type 2 with complications Lab work ordered, stressed importance of weight loss and dietary changes Long discussion with him concerning each of his meals Suggested various changes in effort to lose weight Weight up 20 pounds  PAD (peripheral artery disease) (HCC) Carotid ultrasound ordered Weight up 20 pounds, likely taking cholesterol and A1c higher Stressed  importance of dietary change home  Long discussion with him concerning 20 pound weight gain, factors leading to this, dietary changes needed, Discussed his orthopedic limitations Stressed importance of diabetes control Ischemic work-up discussed, stress test ordered, ultrasound ordered for carotid  Total encounter time more than 45 minutes  Greater than 50% was spent in counseling and coordination of care with the patient  Disposition:   F/U  12 months   Orders Placed This Encounter  Procedures  . EKG 12-Lead     Signed, Esmond Plants, M.D., Ph.D. 12/19/2018  [place], [place]

## 2018-12-19 ENCOUNTER — Ambulatory Visit (INDEPENDENT_AMBULATORY_CARE_PROVIDER_SITE_OTHER): Payer: BLUE CROSS/BLUE SHIELD | Admitting: Cardiovascular Disease

## 2018-12-19 ENCOUNTER — Encounter: Payer: Self-pay | Admitting: Cardiovascular Disease

## 2018-12-19 VITALS — BP 140/82 | HR 69 | Wt 256.5 lb

## 2018-12-19 DIAGNOSIS — I6521 Occlusion and stenosis of right carotid artery: Secondary | ICD-10-CM | POA: Diagnosis not present

## 2018-12-19 DIAGNOSIS — I25118 Atherosclerotic heart disease of native coronary artery with other forms of angina pectoris: Secondary | ICD-10-CM | POA: Diagnosis not present

## 2018-12-19 DIAGNOSIS — I739 Peripheral vascular disease, unspecified: Secondary | ICD-10-CM

## 2018-12-19 DIAGNOSIS — E782 Mixed hyperlipidemia: Secondary | ICD-10-CM

## 2018-12-19 NOTE — Patient Instructions (Addendum)
PLEASE CALL us TO SCHEDULE THE FOLLOWING 1. CAROTID ULTRASOUND 2. LEXISCAN STRESS TEST 3. FASTING LABS  [phone]   Medication Instructions:  No changes  If you need a refill on your cardiac medications before your next appointment, please call your pharmacy.    Lab work: Patient to come back for fasting labs to be done. Nothing to eat or drink after midnight prior except small sip of water with pills. CBC, CMP, Lipid panel, and HgA1c   If you have labs (blood work) drawn today and your tests are completely normal, you will receive your results only by: Marland Kitchen MyChart Message (if you have MyChart) OR . A paper copy in the mail If you have any lab test that is abnormal or we need to change your treatment, we will call you to review the results.   Testing/Procedures: We will schedule a lexiscan myoview for SOB, with hx of CAD [facility] MYOVIEW  Your caregiver has ordered a Stress Test with nuclear imaging. The purpose of this test is to evaluate the blood supply to your heart muscle. This procedure is referred to as a "Non-Invasive Stress Test." This is because other than having an IV started in your vein, nothing is inserted or "invades" your body. Cardiac stress tests are done to find areas of poor blood flow to the heart by determining the extent of coronary artery disease (CAD). Some patients exercise on a treadmill, which naturally increases the blood flow to your heart, while others who are  unable to walk on a treadmill due to physical limitations have a pharmacologic/chemical stress agent called Lexiscan . This medicine will mimic walking on a treadmill by temporarily increasing your coronary blood flow.   Please note: these test may take anywhere between 2-4 hours to complete  PLEASE REPORT TO [place] AT THE FIRST DESK WILL DIRECT YOU WHERE TO GO  Date of Procedure:_____________________________________  Arrival Time for  Procedure:______________________________   PLEASE NOTIFY THE OFFICE AT LEAST 24 HOURS IN ADVANCE IF YOU ARE UNABLE TO KEEP YOUR APPOINTMENT.  [phone] AND  PLEASE NOTIFY NUCLEAR MEDICINE AT [facility] AT LEAST 24 HOURS IN ADVANCE IF YOU ARE UNABLE TO KEEP YOUR APPOINTMENT. [phone]  How to prepare for your Myoview test:  1. Do not eat or drink after midnight 2. No caffeine for 24 hours prior to test 3. No smoking 24 hours prior to test. 4. Your medication may be taken with water.  If your doctor stopped a medication because of this test, do not take that medication. 5. Ladies, please do not wear dresses.  Skirts or pants are appropriate. Please wear a short sleeve shirt. 6. No perfume, cologne or lotion. 7. Wear comfortable walking shoes. No heels!   We will order a carotid doppler for hx of CEA in 2011 Your physician has requested that you have a carotid duplex. This test is an ultrasound of the carotid arteries in your neck. It looks at blood flow through these arteries that supply the brain with blood. Allow one hour for this exam. There are no restrictions or special instructions.   Follow-Up: At [facility], you and your health needs are our priority.  As part of our continuing mission to provide you with exceptional heart care, we have created designated Provider Care Teams.  These Care Teams include your primary Cardiologist (physician) and Advanced Practice Providers (APPs -  Physician Assistants and Nurse Practitioners) who all work together to provide you with the care  you need, when you need it.  . You will need a follow up appointment in 12 months .   Please call our office 2 months in advance to schedule this appointment.    . Providers on your designated Care Team:   . Murray Hodgkins, NP . Christell Faith, PA-C . Marrianne Mood, PA-C  Any Other Special Instructions Will Be Listed Below (If Applicable).  For educational health videos Log in to : [email or web address] Or  : [email or web address], password : [email or web address]   Cardiac Nuclear Scan A cardiac nuclear scan is a test that is done to check the flow of blood to your heart. It is done when you are resting and when you are exercising. The test looks for problems such as:  Not enough blood reaching a portion of the heart.  The heart muscle not working as it should. You may need this test if:  You have heart disease.  You have had lab results that are not normal.  You have had heart surgery or a balloon procedure to open up blocked arteries (angioplasty).  You have chest pain.  You have shortness of breath. In this test, a special dye (tracer) is put into your bloodstream. The tracer will travel to your heart. A camera will then take pictures of your heart to see how the tracer moves through your heart. This test is usually done at a hospital and takes 2-4 hours. Tell a doctor about:  Any allergies you have.  All medicines you are taking, including vitamins, herbs, eye drops, creams, and over-the-counter medicines.  Any problems you or family members have had with anesthetic medicines.  Any blood disorders you have.  Any surgeries you have had.  Any medical conditions you have.  Whether you are pregnant or may be pregnant. What are the risks? Generally, this is a safe test. However, problems may occur, such as:  Serious chest pain and heart attack. This is only a risk if the stress portion of the test is done.  Rapid heartbeat.  A feeling of warmth in your chest. This feeling usually does not last long.  Allergic reaction to the tracer. What happens before the test?  Ask your doctor about changing or stopping your normal medicines. This is important.  Follow instructions from your doctor about what you cannot eat or drink.  Remove your jewelry on the day of the test. What happens during the test?  An IV tube will be inserted into one of your veins.  Your doctor will give you a small  amount of tracer through the IV tube.  You will wait for 20-40 minutes while the tracer moves through your bloodstream.  Your heart will be monitored with an electrocardiogram (ECG).  You will lie down on an exam table.  Pictures of your heart will be taken for about 15-20 minutes.  You may also have a stress test. For this test, one of these things may be done: ? You will be asked to exercise on a treadmill or a stationary bike. ? You will be given medicines that will make your heart work harder. This is done if you are unable to exercise.  When blood flow to your heart has peaked, a tracer will again be given through the IV tube.  After 20-40 minutes, you will get back on the exam table. More pictures will be taken of your heart.  Depending on the tracer that is used, more pictures may need to be taken 3-4 hours later.  Your IV tube will be removed when the test is over. The test may vary among doctors and hospitals. What happens after the test?  Ask your doctor: ? Whether you can return to your normal schedule, including diet, activities, and medicines. ? Whether you should drink more fluids. This will help to remove the tracer from your body. Drink enough fluid to keep your pee (urine) pale yellow.  Ask your doctor, or the department that is doing the test: ? When will my results be ready? ? How will I get my results? Summary  A cardiac nuclear scan is a test that is done to check the flow of blood to your heart.  Tell your doctor whether you are pregnant or may be pregnant.  Before the test, ask your doctor about changing or stopping your normal medicines. This is important.  Ask your doctor whether you can return to your normal activities. You may be asked to drink more fluids. This information is not intended to replace advice given to you by your health care provider. Make sure you discuss any questions you have with your health care provider. Document Released:  03/19/2018 Document Revised: 03/19/2018 Document Reviewed: 03/19/2018 Elsevier Interactive Patient Education  2019 [employer].

## 2019-01-01 ENCOUNTER — Other Ambulatory Visit (INDEPENDENT_AMBULATORY_CARE_PROVIDER_SITE_OTHER): Payer: BLUE CROSS/BLUE SHIELD | Admitting: *Deleted

## 2019-01-01 ENCOUNTER — Other Ambulatory Visit: Payer: Self-pay

## 2019-01-01 DIAGNOSIS — I25118 Atherosclerotic heart disease of native coronary artery with other forms of angina pectoris: Secondary | ICD-10-CM

## 2019-01-02 LAB — COMPREHENSIVE METABOLIC PANEL
ALK PHOS: 79 IU/L (ref 39–117)
ALT: 49 IU/L — AB (ref 0–44)
AST: 39 IU/L (ref 0–40)
Albumin/Globulin Ratio: 1.4 (ref 1.2–2.2)
Albumin: 3.8 g/dL (ref 3.8–4.8)
BUN/Creatinine Ratio: 16 (ref 10–24)
BUN: 16 mg/dL (ref 8–27)
Bilirubin Total: 0.3 mg/dL (ref 0.0–1.2)
CHLORIDE: 104 mmol/L (ref 96–106)
CO2: 23 mmol/L (ref 20–29)
Calcium: 9.6 mg/dL (ref 8.6–10.2)
Creatinine, Ser: 1.03 mg/dL (ref 0.76–1.27)
GFR calc non Af Amer: 76 mL/min/{1.73_m2} (ref >59)
GFR, EST AFRICAN AMERICAN: 88 mL/min/{1.73_m2} (ref >59)
GLOBULIN, TOTAL: 2.8 g/dL (ref 1.5–4.5)
GLUCOSE: 117 mg/dL — AB (ref 65–99)
POTASSIUM: 4.5 mmol/L (ref 3.5–5.2)
Sodium: 144 mmol/L (ref 134–144)
Total Protein: 6.6 g/dL (ref 6.0–8.5)

## 2019-01-02 LAB — CBC WITH DIFFERENTIAL/PLATELET
BASOS: 1 %
Basophils Absolute: 0.1 10*3/uL (ref 0.0–0.2)
EOS (ABSOLUTE): 0.4 10*3/uL (ref 0.0–0.4)
EOS: 6 %
HEMATOCRIT: 39.2 % (ref 37.5–51.0)
HEMOGLOBIN: 13.1 g/dL (ref 13.0–17.7)
IMMATURE GRANS (ABS): 0 10*3/uL (ref 0.0–0.1)
IMMATURE GRANULOCYTES: 0 %
LYMPHS: 24 %
Lymphocytes Absolute: 1.5 10*3/uL (ref 0.7–3.1)
MCH: 27.1 pg (ref 26.6–33.0)
MCHC: 33.4 g/dL (ref 31.5–35.7)
MCV: 81 fL (ref 79–97)
MONOCYTES: 15 %
Monocytes Absolute: 0.9 10*3/uL (ref 0.1–0.9)
NEUTROS ABS: 3.4 10*3/uL (ref 1.4–7.0)
NEUTROS PCT: 54 %
Platelets: 289 10*3/uL (ref 150–450)
RBC: 4.83 x10E6/uL (ref 4.14–5.80)
RDW: 13.6 % (ref 11.6–15.4)
WBC: 6.3 10*3/uL (ref 3.4–10.8)

## 2019-01-02 LAB — LIPID PANEL
Chol/HDL Ratio: 3.4 ratio (ref 0.0–5.0)
Cholesterol, Total: 151 mg/dL (ref 100–199)
HDL: 44 mg/dL (ref >39)
LDL Calculated: 76 mg/dL (ref 0–99)
TRIGLYCERIDES: 154 mg/dL — AB (ref 0–149)
VLDL Cholesterol Cal: 31 mg/dL (ref 5–40)

## 2019-01-02 LAB — HEMOGLOBIN A1C
Est. average glucose Bld gHb Est-mCnc: 131 mg/dL
HEMOGLOBIN A1C: 6.2 % — AB (ref 4.8–5.6)

## 2019-01-03 ENCOUNTER — Telehealth: Payer: Self-pay | Admitting: *Deleted

## 2019-01-03 NOTE — Telephone Encounter (Signed)
-----   Message from Minna Merritts, MD sent at 01/02/2019  8:32 AM EDT ----- Cholesterol above goal likely from recent weight gain Would consider adding Zetia to his Lipitor Ideally would like LDL less than 60 and we are mid 70s

## 2019-01-03 NOTE — Telephone Encounter (Signed)
No answer. Left message to call back.   

## 2019-01-04 MED ORDER — EZETIMIBE 10 MG PO TABS: 10.0000 mg | ORAL_TABLET | Freq: Every day | ORAL | 3 refills | Status: DC

## 2019-01-04 NOTE — Telephone Encounter (Signed)
Pt is returning your call

## 2019-01-04 NOTE — Telephone Encounter (Signed)
Instructed patient to START ZETIA 10 mg daily in conjunction with his Lipitor. He was grateful for call and agrees with treatment plan.

## 2019-01-10 ENCOUNTER — Other Ambulatory Visit: Payer: Self-pay

## 2019-01-10 ENCOUNTER — Encounter
Admission: RE | Admit: 2019-01-10 | Discharge: 2019-01-10 | Disposition: A | Discharge status: Home / Self Care | Payer: BLUE CROSS/BLUE SHIELD | Source: Ambulatory Visit | Attending: Cardiovascular Disease | Admitting: Cardiovascular Disease

## 2019-01-10 DIAGNOSIS — I25118 Atherosclerotic heart disease of native coronary artery with other forms of angina pectoris: Secondary | ICD-10-CM | POA: Diagnosis not present

## 2019-01-10 LAB — NM MYOCAR MULTI W/SPECT W/WALL MOTION / EF
CHL CUP NUCLEAR SDS: 0
CHL CUP NUCLEAR SRS: 6
CHL CUP NUCLEAR SSS: 1
CSEPEW: 1 METS
CSEPPHR: 90 {beats}/min
Exercise duration (min): 0 min
Exercise duration (sec): 0 s
LV dias vol: 113 mL (ref 62–150)
LVSYSVOL: 51 mL
MPHR: 156 {beats}/min
NUC STRESS TID: 1.05
Percent HR: 57 %
Rest HR: 90 {beats}/min

## 2019-01-10 MED ORDER — REGADENOSON 0.4 MG/5ML IV SOLN
0.4000 mg | Freq: Once | INTRAVENOUS | Status: AC
  Administered 2019-01-10: 0.4 mg via INTRAVENOUS

## 2019-01-10 MED ORDER — TECHNETIUM TC 99M TETROFOSMIN IV KIT
30.0000 | PACK | Freq: Once | INTRAVENOUS | Status: AC | PRN
  Administered 2019-01-10: 31.954 via INTRAVENOUS

## 2019-01-10 MED ORDER — TECHNETIUM TC 99M TETROFOSMIN IV KIT
10.5700 | PACK | Freq: Once | INTRAVENOUS | Status: AC | PRN
  Administered 2019-01-10: 10.57 via INTRAVENOUS

## 2019-03-13 DIAGNOSIS — Z6841 Body Mass Index (BMI) 40.0 and over, adult: Secondary | ICD-10-CM | POA: Diagnosis not present

## 2019-03-13 DIAGNOSIS — M5416 Radiculopathy, lumbar region: Secondary | ICD-10-CM | POA: Diagnosis not present

## 2019-03-13 DIAGNOSIS — I10 Essential (primary) hypertension: Secondary | ICD-10-CM | POA: Diagnosis not present

## 2019-04-01 DIAGNOSIS — M5416 Radiculopathy, lumbar region: Secondary | ICD-10-CM | POA: Diagnosis not present

## 2019-04-04 ENCOUNTER — Telehealth: Payer: Self-pay | Admitting: Cardiovascular Disease

## 2019-04-04 DIAGNOSIS — M5416 Radiculopathy, lumbar region: Secondary | ICD-10-CM | POA: Diagnosis not present

## 2019-04-04 DIAGNOSIS — M48061 Spinal stenosis, lumbar region without neurogenic claudication: Secondary | ICD-10-CM | POA: Diagnosis not present

## 2019-04-04 NOTE — Telephone Encounter (Signed)
   [facility] Pre-operative Risk Assessment    Request for surgical clearance:  1. What type of surgery is being performed? L 2-3 LUMBAR MICRODISKECTOMY  2. When is this surgery scheduled? TBD  3. What type of clearance is required (medical clearance vs. Pharmacy clearance to hold med vs. Both)? NOT LISTED  4. Are there any medications that need to be held prior to surgery and how long? NOT LISTED  5. Practice name and name of physician performing surgery? [facility]  6. What is your office phone number [phone]   7.   What is your office fax number [phone]  8.   Anesthesia type (None, local, MAC, general) ? GENERAL   Lucienne Minks 04/04/2019, 3:29 PM  _________________________________________________________________   (provider comments below)

## 2019-04-05 NOTE — Telephone Encounter (Signed)
   Primary Cardiologist: Ida Rogue, MD  Chart reviewed as part of pre-operative protocol coverage. Patient was contacted 04/05/2019 in reference to pre-operative risk assessment for pending surgery as outlined below.  Brandon Henderson was last seen on 12/19/2018 by Dr. Rockey Situ.  Since that day, Brandon Henderson has done well. He still as some occ upper abdomen/lower chest discomfort. He had a low risk myoview  on 01/10/2019. He feels like symptoms may be related to lungs and his hx of vaping.   Therefore, based on ACC/AHA guidelines, the patient would be at acceptable risk for the planned procedure without further cardiovascular testing.   I will route this recommendation to the requesting party via Epic fax function and remove from pre-op pool.  Please call with questions.  Daune Perch, NP 04/05/2019, 3:48 PM

## 2019-04-05 NOTE — Telephone Encounter (Signed)
Pt had recent normal stress myoview. Last seen in 12/2018. I called and left message for pt to call back to update his current status and any symptoms.

## 2019-04-05 NOTE — Telephone Encounter (Signed)
Follow up    Patient returnng call from pre-op

## 2019-04-08 ENCOUNTER — Other Ambulatory Visit: Payer: Self-pay | Admitting: Internal Medicine

## 2019-04-08 ENCOUNTER — Ambulatory Visit
Admission: RE | Admit: 2019-04-08 | Discharge: 2019-04-08 | Disposition: A | Discharge status: Home / Self Care | Payer: BC Managed Care – PPO | Source: Ambulatory Visit | Attending: Internal Medicine | Admitting: Internal Medicine

## 2019-04-08 ENCOUNTER — Other Ambulatory Visit: Payer: Self-pay

## 2019-04-08 DIAGNOSIS — R0789 Other chest pain: Secondary | ICD-10-CM | POA: Diagnosis not present

## 2019-04-08 DIAGNOSIS — I2510 Atherosclerotic heart disease of native coronary artery without angina pectoris: Secondary | ICD-10-CM | POA: Diagnosis not present

## 2019-04-08 DIAGNOSIS — R079 Chest pain, unspecified: Secondary | ICD-10-CM | POA: Insufficient documentation

## 2019-04-08 DIAGNOSIS — F325 Major depressive disorder, single episode, in full remission: Secondary | ICD-10-CM | POA: Diagnosis not present

## 2019-04-08 DIAGNOSIS — E119 Type 2 diabetes mellitus without complications: Secondary | ICD-10-CM | POA: Diagnosis not present

## 2019-04-08 LAB — POCT I-STAT CREATININE: Creatinine, Ser: 1.2 mg/dL (ref 0.61–1.24)

## 2019-04-08 IMAGING — CT CT ANGIOGRAPHY CHEST
2 of 6 series · 18 of 36 positions shown · IV contrast (APPLIED)
Comparison: None.

CLINICAL DATA: Chest pain and pressure.

EXAM:
CT ANGIOGRAPHY CHEST WITH CONTRAST
TECHNIQUE: Multidetector CT imaging of the chest was performed using the
standard protocol during bolus administration of intravenous
contrast. Multiplanar CT image reconstructions and MIPs were
obtained to evaluate the vascular anatomy.
CONTRAST:  75mL OMNIPAQUE IOHEXOL 350 MG/ML SOLN

[Series 5: thins · axial · 0.72mm/px · z∈[-337,-91]mm · 17 of 274 slices shown]
[im 14/274  lung]
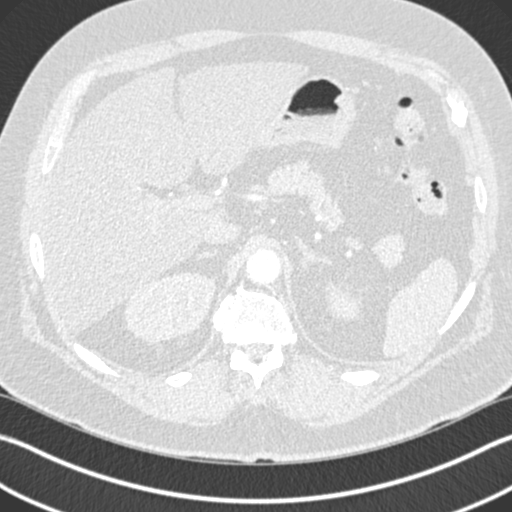
[im 28/274  mediastinal]
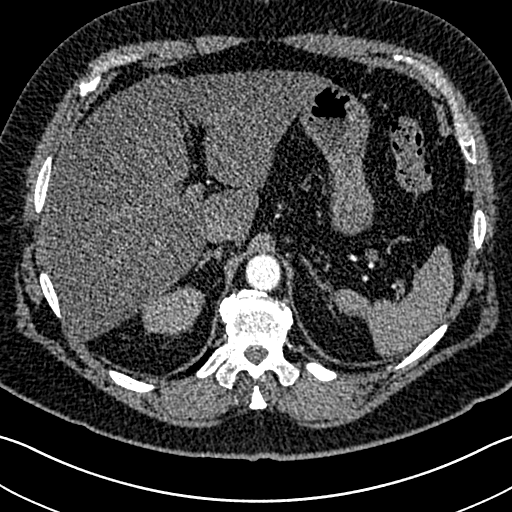
[im 41/274  lung]
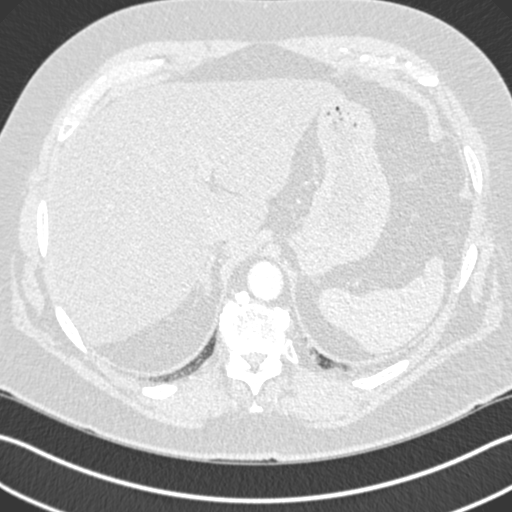
[im 55/274  mediastinal]
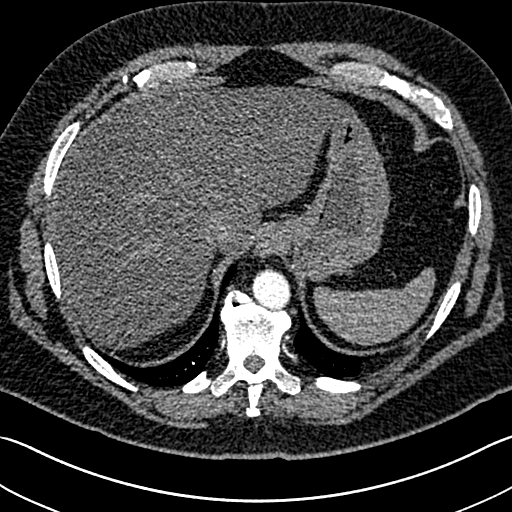
[im 82/274  lung]
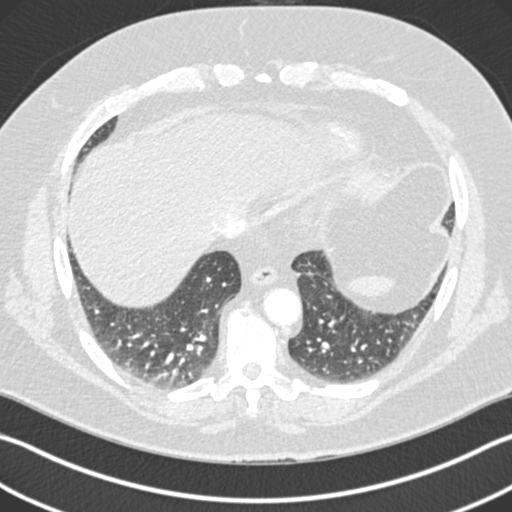
[im 96/274  mediastinal]
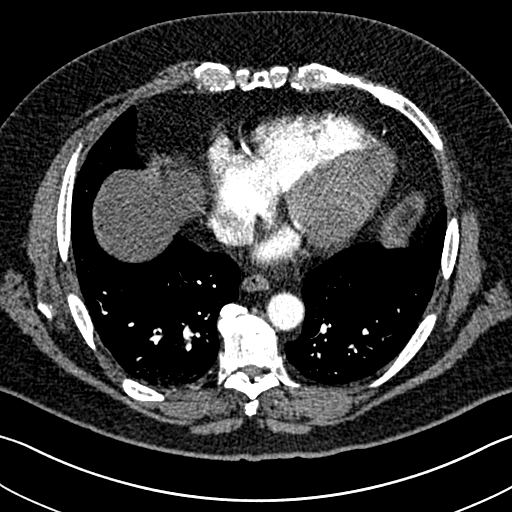
[im 110/274  lung]
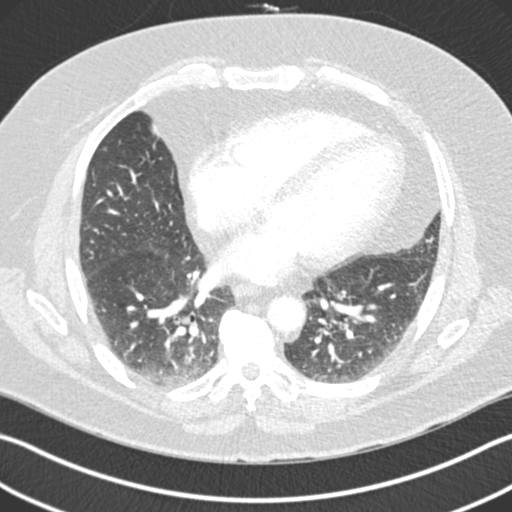
[im 123/274  mediastinal]
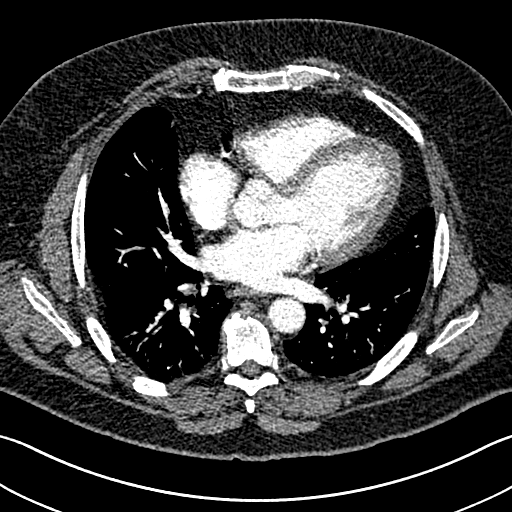
[im 137/274  lung]
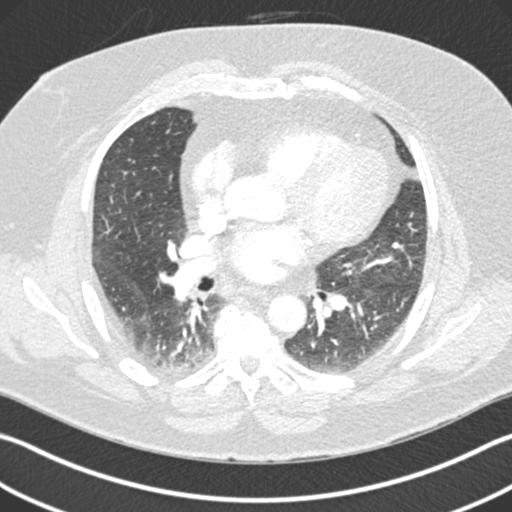
[im 151/274  mediastinal]
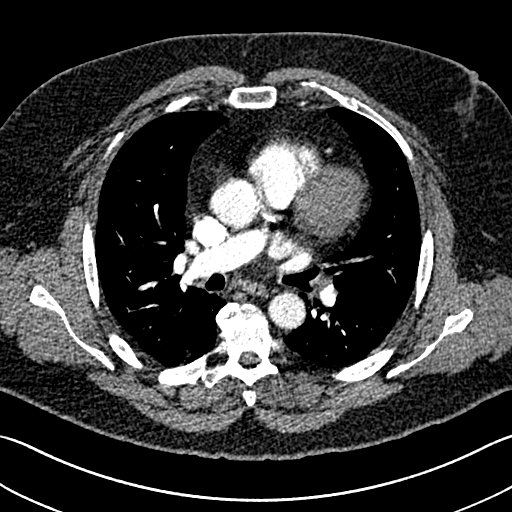
[im 164/274  lung]
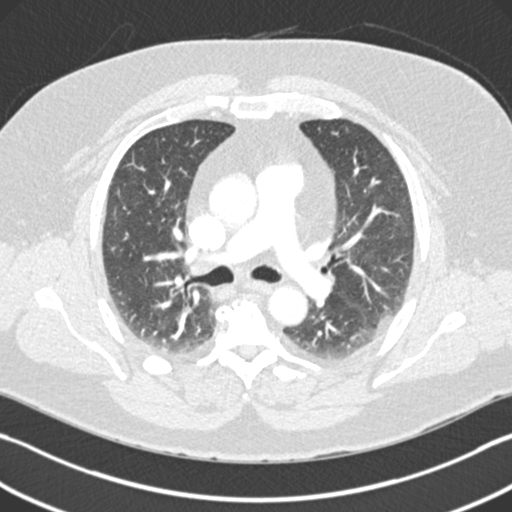
[im 178/274  mediastinal]
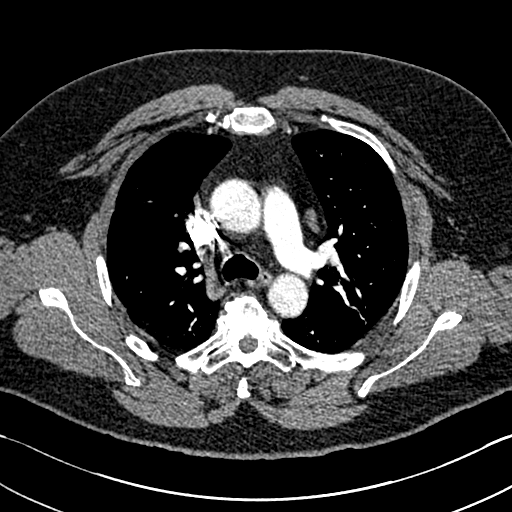
[im 192/274  lung]
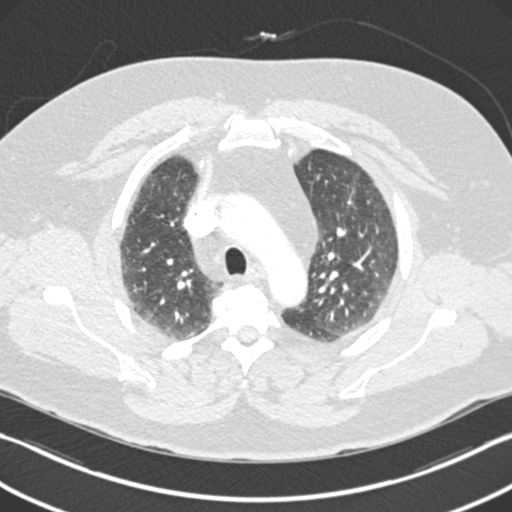
[im 219/274  mediastinal]
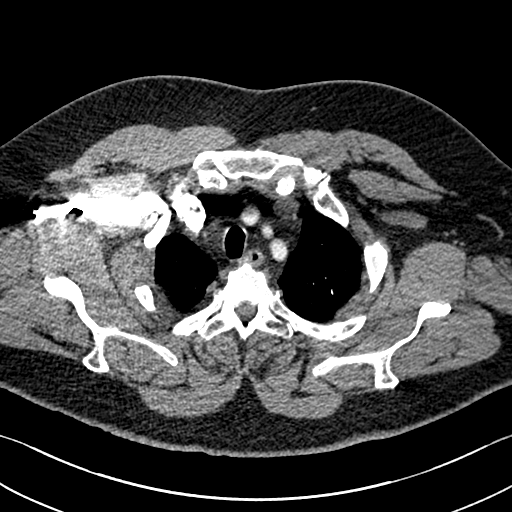
[im 233/274  lung]
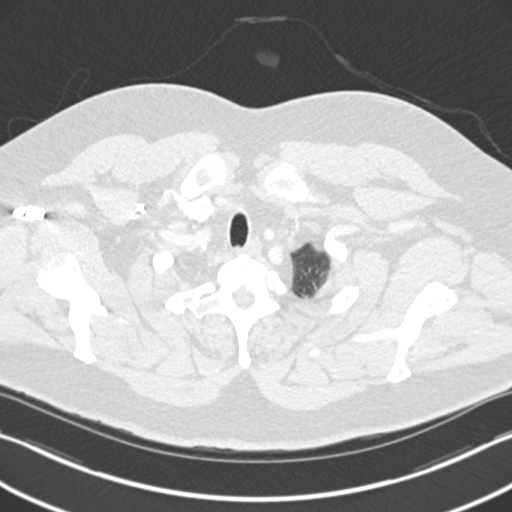
[im 246/274  mediastinal]
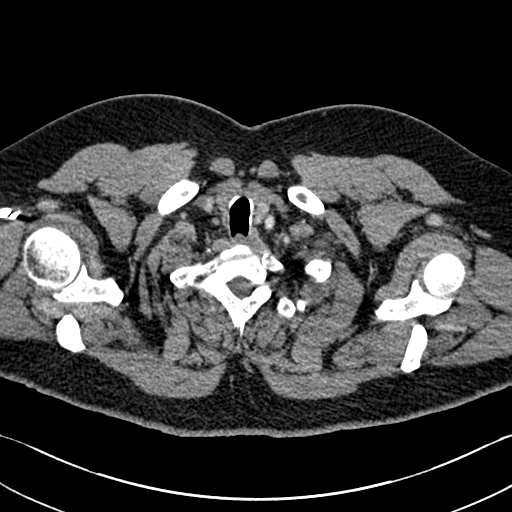
[im 260/274  lung]
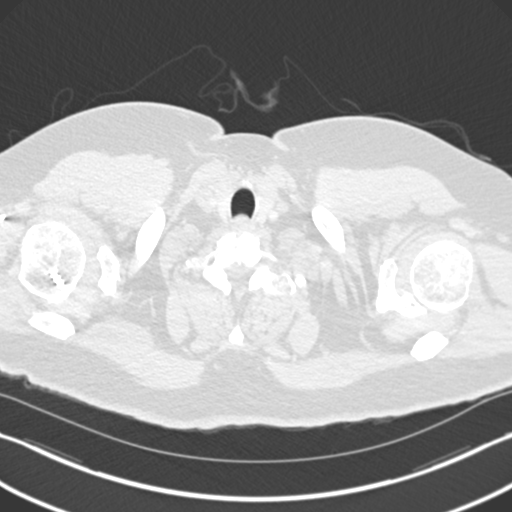

[Series 7: coronal mpr · coronal · 0.56mm/px · 1 of 103 slices shown]
[im 52/103  mediastinal]
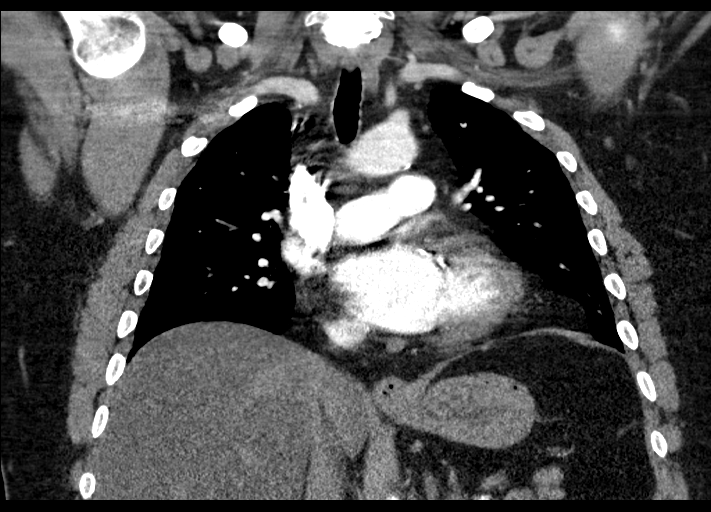

[18 of 36 positions shown; findings below may reference images not displayed]

FINDINGS: Cardiovascular: No significant filling defect or pulmonary embolus
demonstrated within the pulmonary vasculature by CTA.

Minor aortic atherosclerosis without aneurysm. Patent 3 vessel arch
anatomy. No dissection. Native coronary atherosclerosis noted. Mild
cardiac enlargement. No pericardial effusion.

Mediastinum/Nodes: No enlarged mediastinal, hilar, or axillary lymph
nodes. Thyroid gland, trachea, and esophagus demonstrate no
significant findings.

Lungs/Pleura: Dependent basilar hypoventilatory changes and low lung
volumes. No acute focal pneumonia, airspace process, collapse or
consolidation. Trachea and central airways are patent. No
interstitial change or edema.

No pleural abnormality, effusion, or pneumothorax.

Upper Abdomen: Diffuse fatty infiltration of the liver. No acute
upper abdominal finding.

Musculoskeletal: Degenerative changes noted of the spine. Remote
right shoulder surgery with fixation screws. No acute osseous
finding.

Review of the MIP images confirms the above findings.
IMPRESSION: No acute significant pulmonary embolus by CTA.

Cardiomegaly without CHF

Low lung volumes with basilar hypoventilatory changes

No other acute intrathoracic finding

Hepatic steatosis

Aortic Atherosclerosis ([G7]-[G7]).

## 2019-04-08 MED ORDER — IOHEXOL 350 MG/ML SOLN
75.0000 mL | Freq: Once | INTRAVENOUS | Status: AC | PRN
  Administered 2019-04-08: 17:00:00 75 mL via INTRAVENOUS

## 2019-04-09 ENCOUNTER — Telehealth: Payer: Self-pay | Admitting: Cardiovascular Disease

## 2019-04-09 ENCOUNTER — Other Ambulatory Visit: Payer: Self-pay | Admitting: Internal Medicine

## 2019-04-09 NOTE — Telephone Encounter (Signed)
Pt c/o of Chest Pain: STAT if CP now or developed within 24 hours  1. Are you having CP right now? No   2. Are you experiencing any other symptoms (ex. SOB, nausea, vomiting, sweating)? Episodes of nausea jaw pain one episode interim sob with exertion   3. How long have you been experiencing CP? Last fall getting more frequent with exertion   4. Is your CP continuous or coming and going? Comes and goes   5. Have you taken Nitroglycerin? No  ? Scheduled 6/26 Sharolyn Douglas

## 2019-04-09 NOTE — Telephone Encounter (Signed)
Spoke with the pt. Pt is currently asymptomatic. Pt sts that he has been having intermittent chest pain and sob since the fall of 2019. Pt sts that e was seen by his pcp on 6/22 and a chest ct was ordered to r/o PE. Ct was negative for a PE. Pt sts that he then decided to contact cardiology to schedule a f/u.  Pt March 2020 stress test was low risk. Pt sts that he will be scheduled in the near future for back surgery and thought it would be a good idea to "get checked out" prior to sx.  Pt already has an appt scheduled for 6/26 with [name], NP. Adv the pt to keep that appt and to contact the office sooner if symptoms worsen.  Pt verbalized understanding and voiced appreciation for the call back.

## 2019-04-11 ENCOUNTER — Telehealth: Payer: Self-pay | Admitting: Cardiovascular Disease

## 2019-04-11 NOTE — Telephone Encounter (Signed)

## 2019-04-12 ENCOUNTER — Telehealth: Payer: Self-pay | Admitting: Cardiovascular Disease

## 2019-04-12 ENCOUNTER — Other Ambulatory Visit: Payer: Self-pay | Admitting: Nurse Practitioner

## 2019-04-12 ENCOUNTER — Other Ambulatory Visit: Payer: Self-pay

## 2019-04-12 ENCOUNTER — Ambulatory Visit: Payer: BC Managed Care – PPO | Admitting: Nurse Practitioner

## 2019-04-12 ENCOUNTER — Encounter: Payer: Self-pay | Admitting: Nurse Practitioner

## 2019-04-12 VITALS — BP 130/84 | HR 64 | Temp 97.2°F | Ht 67.0 in | Wt 259.2 lb

## 2019-04-12 DIAGNOSIS — I25118 Atherosclerotic heart disease of native coronary artery with other forms of angina pectoris: Secondary | ICD-10-CM | POA: Diagnosis not present

## 2019-04-12 DIAGNOSIS — I200 Unstable angina: Secondary | ICD-10-CM | POA: Diagnosis not present

## 2019-04-12 DIAGNOSIS — I25110 Atherosclerotic heart disease of native coronary artery with unstable angina pectoris: Secondary | ICD-10-CM

## 2019-04-12 DIAGNOSIS — E785 Hyperlipidemia, unspecified: Secondary | ICD-10-CM

## 2019-04-12 DIAGNOSIS — I10 Essential (primary) hypertension: Secondary | ICD-10-CM

## 2019-04-12 DIAGNOSIS — I2510 Atherosclerotic heart disease of native coronary artery without angina pectoris: Secondary | ICD-10-CM

## 2019-04-12 DIAGNOSIS — I6521 Occlusion and stenosis of right carotid artery: Secondary | ICD-10-CM

## 2019-04-12 MED ORDER — ISOSORBIDE MONONITRATE ER 30 MG PO TB24: 15.0000 mg | ORAL_TABLET | Freq: Every day | ORAL | 3 refills | Status: DC

## 2019-04-12 MED ORDER — NITROGLYCERIN 0.4 MG SL SUBL: 0.4000 mg | SUBLINGUAL_TABLET | SUBLINGUAL | 3 refills | Status: AC | PRN

## 2019-04-12 NOTE — Patient Instructions (Signed)
Medication Instructions:  Your physician has recommended you make the following change in your medication:  1- START Imdur 0.5 tablet ( 15 mg total) once daily 2- As needed Take NTG Place 1 tablet (0.4 mg total) under the tongue every 5 (five) minutes as needed for chest pain. Max 3 tablets. If chest pain is not relieved after 3 tablets seek urgent medical attention.   If you need a refill on your cardiac medications before your next appointment, please call your pharmacy.   Lab work: Your physician recommends that you have lab work today(CBC, BMET)  If you have labs (blood work) drawn today and your tests are completely normal, you will receive your results only by: Marland Kitchen MyChart Message (if you have MyChart) OR . A paper copy in the mail If you have any lab test that is abnormal or we need to change your treatment, we will call you to review the results.  Testing/Procedures: 1- Left Heart Cath    [address], [place] [id number] Dept: [phone] Loc: [phone]  Iver Miklas  04/12/2019  You are scheduled for a Cardiac Catheterization on ________ @ _________.  1. Please arrive at the medical mall of [facility] @ ______. (This time is two hours before your procedure to ensure your preparation). Free valet parking service is available.   Special note: Every effort is made to have your procedure done on time. Please understand that emergencies sometimes delay scheduled procedures.  2. Diet: Do not eat solid foods after midnight.  The patient may have clear liquids until 5am upon the day of the procedure.  3. Labs: You will need to have blood drawn today.   4. Medication instructions in preparation for your procedure:  On the morning of your procedure, take your Aspirin and any morning medicines NOT listed above.  You may use sips of water.  5. Plan for one night stay--bring  personal belongings. 6. Bring a current list of your medications and current insurance cards. 7. You MUST have a responsible person to drive you home. 8. Someone MUST be with you the first 24 hours after you arrive home or your discharge will be delayed. 9. Please wear clothes that are easy to get on and off and wear slip-on shoes.  Thank you for allowing [place] to care for you!   -- [place] [facility]   Follow-Up: At [facility], you and your health needs are our priority.  As part of our continuing mission to provide you with exceptional heart care, we have created designated Provider Care Teams.  These Care Teams include your primary Cardiologist (physician) and Advanced Practice Providers (APPs -  Physician Assistants and Nurse Practitioners) who all work together to provide you with the care you need, when you need it. You will need a follow up appointment in 3 weeks. You may see Ida Rogue, MD or Murray Hodgkins, NP.    Please call back to schedule cath. [phone].

## 2019-04-12 NOTE — Telephone Encounter (Signed)
Patient needs to schedule an angiogram from his appointment this morning with Sharolyn Douglas. Due to having the Covid screening he prefers 7/6 if at all possible.  Thank you

## 2019-04-12 NOTE — Telephone Encounter (Signed)
Returned call to patient for scheduling of L heart cath.   He preferred Tuesday 7/7.  Scheduled for 7/7 @8 :30 AM with a 7:30 arrival time.   [facility] Friday 7/3 from 12:30-2:20PM.   Reviewed quarantine procedures following CV 19 testing. All questions were answered.   Advised pt to call for any further questions or concerns.

## 2019-04-12 NOTE — H&P (View-Only) (Signed)
Cardiology Clinic Note   Patient Name: Brandon Henderson Date of Encounter: 04/12/2019  Primary Care Provider:  Kirk Ruths, MD Primary Cardiologist:  Ida Rogue, MD  Patient Profile    63 year old male with a history of CAD status post prior bare-metal stenting of the second obtuse marginal, hypertension, hyperlipidemia, sleep apnea, obesity, remote tobacco abuse, diabetes, carotid arterial disease, depression, arthritis who presents for follow-up related to ongoing exertional chest pain.  Past Medical History    Past Medical History:  Diagnosis Date   Arthritis    Carotid arterial disease ([place])    a. 2011 s/p R CEA.   Coronary artery disease    a. 2000 s/p PCI/BMS x 2 of OM2 [name], [place]); b. 12/2018 MV: EF 43% (likely 2/2 GI uptake artifact). No ischemia. Low risk.   Depression    Diabetes mellitus without complication ([place])    History of tobacco abuse    Hyperlipidemia LDL goal <70    Hypertension    Morbid obesity ([place])    Osteoarthritis    Sleep apnea    Past Surgical History:  Procedure Laterality Date   artoscopic rotator cuff repair     BACK SURGERY  1991   CARDIAC CATHETERIZATION     CAROTID ARTERY ANGIOPLASTY  2011   carotid endartarectomy     COLONOSCOPY WITH PROPOFOL N/A 06/12/2018   Procedure: COLONOSCOPY WITH PROPOFOL;  Surgeon: Toledo, Benay Pike, MD;  Location: [facility] ENDOSCOPY;  Service: Gastroenterology;  Laterality: N/A;   ESOPHAGOGASTRODUODENOSCOPY (EGD) WITH PROPOFOL N/A 06/12/2018   Procedure: ESOPHAGOGASTRODUODENOSCOPY (EGD) WITH PROPOFOL;  Surgeon: Toledo, Benay Pike, MD;  Location: [facility] ENDOSCOPY;  Service: Gastroenterology;  Laterality: N/A;   Rotator cuff  2010    Allergies  No Known Allergies  History of Present Illness    63 year old male with above complex past medical history including coronary artery disease status post bare-metal stenting of the obtuse marginal in [place] in 2000.  Other history includes  hypertension, hyperlipidemia, carotid arterial disease status post right carotid endarterectomy, obesity, sleep apnea, diabetes, remote tobacco abuse, depression, and arthritis.  Dating back to the fall 2019, he has been experiencing intermittent exertional chest pressure with associated dyspnea, typically lasting about a minute, and resolving with rest.  He was seen by Dr. Rockey Situ in March of this year and underwent stress testing which showed no evidence of ischemia.  EF was depressed at 43% however, this was felt to be secondary to GI uptake artifact.  Unfortunately, despite negative stress test, patient is continued to have intermittent exertional chest tightness, mostly noted when walking at work.  As noted, this is always associated with dyspnea and can sometimes move to his arms or jaw.  On at least one episode, he noted mild nausea.  Symptoms typically last just a few minutes and resolve with rest.  Because of ongoing symptoms, he followed up with primary care just the other day and underwent CT angiography of the chest which was negative for PE.  No other significant findings were noted.  He was referred back to [place] for further evaluation.  He denies any resting symptoms, palpitations, PND, orthopnea, dizziness, syncope, edema, or early satiety.  He does have low back pain and will likely require lumbar surgery later this year.  Home Medications    Prior to Admission medications   Medication Sig Start Date End Date Taking? Authorizing Provider  Ascorbic Acid (VITAMIN C) 1000 MG tablet Take 1,000 mg by mouth daily.   Yes [provider]  aspirin EC 81 MG tablet Take 81 mg by mouth daily.   Yes [provider]  atorvastatin (LIPITOR) 40 MG tablet TAKE 1 TABLET BY MOUTH ONCE A DAY 10/31/18  Yes Gollan, Kathlene November, MD  Coenzyme Q10 (CO Q-10) 100 MG CAPS Take by mouth.   Yes [provider]  escitalopram (LEXAPRO) 10 MG tablet Take 10 mg by mouth daily.    Yes [provider]  glucosamine-chondroitin 500-400 MG tablet Take 1 tablet by mouth daily.    Yes [provider]  MAGNESIUM OXIDE PO Take 100 mg by mouth daily.    Yes [provider]  pantoprazole (PROTONIX) 40 MG tablet Take 40 mg by mouth daily.   Yes [provider]  ramipril (ALTACE) 5 MG capsule TAKE 1 CAPSULE BY MOUTH ONCE DAILY 10/31/18  Yes Gollan, Kathlene November, MD  ezetimibe (ZETIA) 10 MG tablet Take 1 tablet (10 mg total) by mouth daily. Patient not taking: Reported on 04/12/2019 01/04/19 12/30/19  Minna Merritts, MD  isosorbide mononitrate (IMDUR) 30 MG 24 hr tablet Take 0.5 tablets (15 mg total) by mouth daily. 04/12/19 07/11/19  Theora Gianotti, NP  nitroGLYCERIN (NITROSTAT) 0.4 MG SL tablet Place 1 tablet (0.4 mg total) under the tongue every 5 (five) minutes as needed for chest pain. 04/12/19 07/11/19  Theora Gianotti, NP    Family History    Family History  Problem Relation Age of Onset   Heart attack Mother    Stroke Father    Colon cancer Sister    He indicated that his mother is deceased. He indicated that his father is deceased. He indicated that his sister is alive.  Social History    Social History   Socioeconomic History   Marital status: Married    Spouse name: Not on file   Number of children: Not on file   Years of education: Not on file   Highest education level: Not on file  Occupational History   Not on file  Social Needs   Financial resource strain: Not on file   Food insecurity    Worry: Not on file    Inability: Not on file   Transportation needs    Medical: Not on file    Non-medical: Not on file  Tobacco Use   Smoking status: Former Smoker   Smokeless tobacco: Never Used   Tobacco comment: quit vaping 10/2018.  Substance and Sexual Activity   Alcohol use: Yes   Drug use: Never   Sexual activity: Not on file  Lifestyle   Physical activity    Days per week: Not on file    Minutes  per session: Not on file   Stress: Not on file  Relationships   Social connections    Talks on phone: Not on file    Gets together: Not on file    Attends religious service: Not on file    Active member of club or organization: Not on file    Attends meetings of clubs or organizations: Not on file    Relationship status: Not on file   Intimate partner violence    Fear of current or ex partner: Not on file    Emotionally abused: Not on file    Physically abused: Not on file    Forced sexual activity: Not on file  Other Topics Concern   Not on file  Social History Narrative   Lives locally.  Works @ [employer].  Does not  routinely exercise.     Review of Systems    General:  No chills, fever, night sweats or weight changes.  Cardiovascular:  +++ ex chest pain, +++ dyspnea on exertion, no edema, orthopnea, palpitations, paroxysmal nocturnal dyspnea. Dermatological: No rash, lesions/masses Respiratory: No cough, +++ dyspnea Urologic: No hematuria, dysuria Abdominal:   +++ occas nausea associated with chest pain, no vomiting, diarrhea, bright red blood per rectum, melena, or hematemesis Neurologic:  No visual changes, wkns, changes in mental status. MSK: +++ low back pain pending surgery. +++ Right knee pain. All other systems reviewed and are otherwise negative except as noted above.  Physical Exam    VS:  BP 130/84 (BP Location: Left Arm, Patient Position: Sitting, Cuff Size: Large)    Pulse 64    Temp (!) 97.2 F (36.2 C)    Ht 5\' 7"  (1.702 m)    Wt 259 lb 4 oz (117.6 kg)    SpO2 98%    BMI 40.60 kg/m  , BMI Body mass index is 40.6 kg/m. GEN: Well nourished, well developed, in no acute distress. HEENT: normal. Neck: Supple, no JVD, carotid bruits, or masses. Cardiac: RRR, no murmurs, rubs, or gallops. No clubbing, cyanosis, edema.  Radials/DP/PT 2+ and equal bilaterally.  Respiratory:  Respirations regular and unlabored, clear to auscultation bilaterally. GI: Obese, soft,  nontender, nondistended, BS + x 4. MS: no deformity or atrophy. Skin: warm and dry, no rash. Neuro:  Strength and sensation are intact. Psych: Normal affect.  Accessory Clinical Findings    ECG personally reviewed by me today-regular sinus rhythm, 68, early repolarization, No acute changes  Assessment & Plan   1.  Unstable angina/coronary artery disease: Patient with a prior history of bare-metal stenting of the second obtuse marginal in [place] in 2000.  Dating back to the fall of last year, he has been experiencing what sounds like progressive exertional angina and dyspnea, now sometimes radiating to his arms or jaw, and sometimes associated with nausea.  Symptoms typically last 1 to 2 minutes and resolve with rest.  He had a low risk stress test in March and a recent CTA of the chest that was negative for PE.  We discussed options for additional cardiac evaluation and given his progressive symptoms with ongoing risk factors, we agreed that diagnostic catheterization is the most appropriate approach.  The patient understands that risks include but are not limited to stroke (1 in 1000), death (1 in 25), kidney failure [usually temporary] (1 in 500), bleeding (1 in 200), allergic reaction [possibly serious] (1 in 200), and agrees to proceed.  I will follow-up labs today.  He is going to call us back later this afternoon to identify a date to schedule this next week.  He will need COVID-19 testing prior to catheterization.  He remains on aspirin, statin, and ACE inhibitor therapy.  I am adding low-dose isosorbide mononitrate-15 mg, as well as sublingual nitroglycerin.  We discussed how to use as needed nitrates.  2.  Essential hypertension: Stable on ACE inhibitor therapy.  3.  Hyperlipidemia: LDL was 76 in March.  Recommendation was for surgery at that time, however he has not started this.  4.  Type 2 diabetes mellitus: A1c 6.2 in March.  This is diet controlled and followed by primary  care.  5.  Carotid arterial disease: Status post right CEA.  We ordered a carotid ultrasound and his last visit however due to the COVID-19 pandemic, this was deferred.  He will reschedule.  Remains on aspirin and statin therapy.  6.  Low back pain/lumbar disc disease: Patient will likely be pending lumbar surgery later this year.  In the setting of chest pain, planning on diagnostic catheterization as part of preoperative evaluation.  7.  Disposition: Follow-up lab work today with plans for diagnostic catheterization at some point next week.  He will contact us to work-up the best day.  Will need COVID-19 testing prior to cath.  Follow-up in office in approximately 3 weeks.  Murray Hodgkins, NP 04/12/2019, 10:02 AM

## 2019-04-12 NOTE — Progress Notes (Signed)
Cardiology Clinic Note   Patient Name: Brandon Henderson Date of Encounter: 04/12/2019  Primary Care Provider:  Kirk Ruths, MD Primary Cardiologist:  Ida Rogue, MD  Patient Profile    63 year old male with a history of CAD status post prior bare-metal stenting of the second obtuse marginal, hypertension, hyperlipidemia, sleep apnea, obesity, remote tobacco abuse, diabetes, carotid arterial disease, depression, arthritis who presents for follow-up related to ongoing exertional chest pain.  Past Medical History    Past Medical History:  Diagnosis Date   Arthritis    Carotid arterial disease ([place])    a. 2011 s/p R CEA.   Coronary artery disease    a. 2000 s/p PCI/BMS x 2 of OM2 [name], [place]); b. 12/2018 MV: EF 43% (likely 2/2 GI uptake artifact). No ischemia. Low risk.   Depression    Diabetes mellitus without complication ([place])    History of tobacco abuse    Hyperlipidemia LDL goal <70    Hypertension    Morbid obesity ([place])    Osteoarthritis    Sleep apnea    Past Surgical History:  Procedure Laterality Date   artoscopic rotator cuff repair     BACK SURGERY  1991   CARDIAC CATHETERIZATION     CAROTID ARTERY ANGIOPLASTY  2011   carotid endartarectomy     COLONOSCOPY WITH PROPOFOL N/A 06/12/2018   Procedure: COLONOSCOPY WITH PROPOFOL;  Surgeon: Toledo, Benay Pike, MD;  Location: [facility] ENDOSCOPY;  Service: Gastroenterology;  Laterality: N/A;   ESOPHAGOGASTRODUODENOSCOPY (EGD) WITH PROPOFOL N/A 06/12/2018   Procedure: ESOPHAGOGASTRODUODENOSCOPY (EGD) WITH PROPOFOL;  Surgeon: Toledo, Benay Pike, MD;  Location: [facility] END[facility]COPY;  Service: Gastroenterology;  Laterality: N/A;   Rotator cuff  2010    Allergies  No Known Allergies  History of Present Illness    63 year old male with above complex past medical history including coronary artery disease status post bare-metal stenting of the obtuse marginal in [place] in 2000.  Other history includes  hypertension, hyperlipidemia, carotid arterial disease status post right carotid endarterectomy, obesity, sleep apnea, diabetes, remote tobacco abuse, depression, and arthritis.  Dating back to the fall 2019, he has been experiencing intermittent exertional chest pressure with associated dyspnea, typically lasting about a minute, and resolving with rest.  He was seen by Dr. Rockey Situ in March of this year and underwent stress testing which showed no evidence of ischemia.  EF was depressed at 43% however, this was felt to be secondary to GI uptake artifact.  Unfortunately, despite negative stress test, patient is continued to have intermittent exertional chest tightness, mostly noted when walking at work.  As noted, this is always associated with dyspnea and can sometimes move to his arms or jaw.  On at least one episode, he noted mild nausea.  Symptoms typically last just a few minutes and resolve with rest.  Because of ongoing symptoms, he followed up with primary care just the other day and underwent CT angiography of the chest which was negative for PE.  No other significant findings were noted.  He was referred back to [place] for further evaluation.  He denies any resting symptoms, palpitations, PND, orthopnea, dizziness, syncope, edema, or early satiety.  He does have low back pain and will likely require lumbar surgery later this year.  Home Medications    Prior to Admission medications   Medication Sig Start Date End Date Taking? Authorizing Provider  Ascorbic Acid (VITAMIN C) 1000 MG tablet Take 1,000 mg by mouth daily.   Yes [provider]  aspirin EC 81 MG tablet Take 81 mg by mouth daily.   Yes [provider]  atorvastatin (LIPITOR) 40 MG tablet TAKE 1 TABLET BY MOUTH ONCE A DAY 10/31/18  Yes Gollan, Kathlene November, MD  Coenzyme Q10 (CO Q-10) 100 MG CAPS Take by mouth.   Yes [provider]  escitalopram (LEXAPRO) 10 MG tablet Take 10 mg by mouth daily.    Yes [provider]  glucosamine-chondroitin 500-400 MG tablet Take 1 tablet by mouth daily.    Yes [provider]  MAGNESIUM OXIDE PO Take 100 mg by mouth daily.    Yes [provider]  pantoprazole (PROTONIX) 40 MG tablet Take 40 mg by mouth daily.   Yes [provider]  ramipril (ALTACE) 5 MG capsule TAKE 1 CAPSULE BY MOUTH ONCE DAILY 10/31/18  Yes Gollan, Kathlene November, MD  ezetimibe (ZETIA) 10 MG tablet Take 1 tablet (10 mg total) by mouth daily. Patient not taking: Reported on 04/12/2019 01/04/19 12/30/19  Minna Merritts, MD  isosorbide mononitrate (IMDUR) 30 MG 24 hr tablet Take 0.5 tablets (15 mg total) by mouth daily. 04/12/19 07/11/19  Theora Gianotti, NP  nitroGLYCERIN (NITROSTAT) 0.4 MG SL tablet Place 1 tablet (0.4 mg total) under the tongue every 5 (five) minutes as needed for chest pain. 04/12/19 07/11/19  Theora Gianotti, NP    Family History    Family History  Problem Relation Age of Onset   Heart attack Mother    Stroke Father    Colon cancer Sister    He indicated that his mother is deceased. He indicated that his father is deceased. He indicated that his sister is alive.  Social History    Social History   Socioeconomic History   Marital status: Married    Spouse name: Not on file   Number of children: Not on file   Years of education: Not on file   Highest education level: Not on file  Occupational History   Not on file  Social Needs   Financial resource strain: Not on file   Food insecurity    Worry: Not on file    Inability: Not on file   Transportation needs    Medical: Not on file    Non-medical: Not on file  Tobacco Use   Smoking status: Former Smoker   Smokeless tobacco: Never Used   Tobacco comment: quit vaping 10/2018.  Substance and Sexual Activity   Alcohol use: Yes   Drug use: Never   Sexual activity: Not on file  Lifestyle   Physical activity    Days per week: Not on file    Minutes  per session: Not on file   Stress: Not on file  Relationships   Social connections    Talks on phone: Not on file    Gets together: Not on file    Attends religious service: Not on file    Active member of club or organization: Not on file    Attends meetings of clubs or organizations: Not on file    Relationship status: Not on file   Intimate partner violence    Fear of current or ex partner: Not on file    Emotionally abused: Not on file    Physically abused: Not on file    Forced sexual activity: Not on file  Other Topics Concern   Not on file  Social History Narrative   Lives locally.  Works @ [employer].  Does not  routinely exercise.     Review of Systems    General:  No chills, fever, night sweats or weight changes.  Cardiovascular:  +++ ex chest pain, +++ dyspnea on exertion, no edema, orthopnea, palpitations, paroxysmal nocturnal dyspnea. Dermatological: No rash, lesions/masses Respiratory: No cough, +++ dyspnea Urologic: No hematuria, dysuria Abdominal:   +++ occas nausea associated with chest pain, no vomiting, diarrhea, bright red blood per rectum, melena, or hematemesis Neurologic:  No visual changes, wkns, changes in mental status. MSK: +++ low back pain pending surgery. +++ Right knee pain. All other systems reviewed and are otherwise negative except as noted above.  Physical Exam    VS:  BP 130/84 (BP Location: Left Arm, Patient Position: Sitting, Cuff Size: Large)    Pulse 64    Temp (!) 97.2 F (36.2 C)    Ht 5\' 7"  (1.702 m)    Wt 259 lb 4 oz (117.6 kg)    SpO2 98%    BMI 40.60 kg/m  , BMI Body mass index is 40.6 kg/m. GEN: Well nourished, well developed, in no acute distress. HEENT: normal. Neck: Supple, no JVD, carotid bruits, or masses. Cardiac: RRR, no murmurs, rubs, or gallops. No clubbing, cyanosis, edema.  Radials/DP/PT 2+ and equal bilaterally.  Respiratory:  Respirations regular and unlabored, clear to auscultation bilaterally. GI: Obese, soft,  nontender, nondistended, BS + x 4. MS: no deformity or atrophy. Skin: warm and dry, no rash. Neuro:  Strength and sensation are intact. Psych: Normal affect.  Accessory Clinical Findings    ECG personally reviewed by me today-regular sinus rhythm, 68, early repolarization, No acute changes  Assessment & Plan   1.  Unstable angina/coronary artery disease: Patient with a prior history of bare-metal stenting of the second obtuse marginal in [place] in 2000.  Dating back to the fall of last year, he has been experiencing what sounds like progressive exertional angina and dyspnea, now sometimes radiating to his arms or jaw, and sometimes associated with nausea.  Symptoms typically last 1 to 2 minutes and resolve with rest.  He had a low risk stress test in March and a recent CTA of the chest that was negative for PE.  We discussed options for additional cardiac evaluation and given his progressive symptoms with ongoing risk factors, we agreed that diagnostic catheterization is the most appropriate approach.  The patient understands that risks include but are not limited to stroke (1 in 1000), death (1 in 54), kidney failure [usually temporary] (1 in 500), bleeding (1 in 200), allergic reaction [possibly serious] (1 in 200), and agrees to proceed.  I will follow-up labs today.  He is going to call us back later this afternoon to identify a date to schedule this next week.  He will need COVID-19 testing prior to catheterization.  He remains on aspirin, statin, and ACE inhibitor therapy.  I am adding low-dose isosorbide mononitrate-15 mg, as well as sublingual nitroglycerin.  We discussed how to use as needed nitrates.  2.  Essential hypertension: Stable on ACE inhibitor therapy.  3.  Hyperlipidemia: LDL was 76 in March.  Recommendation was for surgery at that time, however he has not started this.  4.  Type 2 diabetes mellitus: A1c 6.2 in March.  This is diet controlled and followed by primary  care.  5.  Carotid arterial disease: Status post right CEA.  We ordered a carotid ultrasound and his last visit however due to the COVID-19 pandemic, this was deferred.  He will reschedule.  Remains on aspirin and statin therapy.  6.  Low back pain/lumbar disc disease: Patient will likely be pending lumbar surgery later this year.  In the setting of chest pain, planning on diagnostic catheterization as part of preoperative evaluation.  7.  Disposition: Follow-up lab work today with plans for diagnostic catheterization at some point next week.  He will contact us to work-up the best day.  Will need COVID-19 testing prior to cath.  Follow-up in office in approximately 3 weeks.  Murray Hodgkins, NP 04/12/2019, 10:02 AM

## 2019-04-12 NOTE — Addendum Note (Signed)
Addended by: Verlon Au on: 04/12/2019 12:13 PM   Modules accepted: Orders

## 2019-04-13 LAB — BASIC METABOLIC PANEL
BUN/Creatinine Ratio: 16 (ref 10–24)
BUN: 14 mg/dL (ref 8–27)
CO2: 22 mmol/L (ref 20–29)
Calcium: 9.5 mg/dL (ref 8.6–10.2)
Chloride: 101 mmol/L (ref 96–106)
Creatinine, Ser: 0.85 mg/dL (ref 0.76–1.27)
GFR calc Af Amer: 106 mL/min/{1.73_m2} (ref >59)
GFR calc non Af Amer: 92 mL/min/{1.73_m2} (ref >59)
Glucose: 111 mg/dL — ABNORMAL HIGH (ref 65–99)
Potassium: 4.8 mmol/L (ref 3.5–5.2)
Sodium: 136 mmol/L (ref 134–144)

## 2019-04-13 LAB — CBC
Hematocrit: 40.1 % (ref 37.5–51.0)
Hemoglobin: 13.9 g/dL (ref 13.0–17.7)
MCH: 27.9 pg (ref 26.6–33.0)
MCHC: 34.7 g/dL (ref 31.5–35.7)
MCV: 81 fL (ref 79–97)
Platelets: 290 10*3/uL (ref 150–450)
RBC: 4.98 x10E6/uL (ref 4.14–5.80)
RDW: 13.5 % (ref 11.6–15.4)
WBC: 6.2 10*3/uL (ref 3.4–10.8)

## 2019-04-19 ENCOUNTER — Other Ambulatory Visit
Admission: RE | Admit: 2019-04-19 | Discharge: 2019-04-19 | Disposition: A | Discharge status: Home / Self Care | Payer: BC Managed Care – PPO | Source: Ambulatory Visit | Attending: Internal Medicine | Admitting: Internal Medicine

## 2019-04-19 ENCOUNTER — Other Ambulatory Visit: Payer: Self-pay

## 2019-04-19 DIAGNOSIS — Z1159 Encounter for screening for other viral diseases: Secondary | ICD-10-CM | POA: Diagnosis not present

## 2019-04-19 DIAGNOSIS — Z01812 Encounter for preprocedural laboratory examination: Secondary | ICD-10-CM | POA: Insufficient documentation

## 2019-04-20 LAB — SARS CORONAVIRUS 2 (TAT 6-24 HRS): SARS Coronavirus 2: NEGATIVE

## 2019-04-23 ENCOUNTER — Other Ambulatory Visit: Payer: Self-pay

## 2019-04-23 ENCOUNTER — Encounter: Payer: Self-pay | Admitting: Emergency Medicine

## 2019-04-23 ENCOUNTER — Observation Stay
Admission: RE | Admit: 2019-04-23 | Discharge: 2019-04-24 | Disposition: A | Discharge status: Home / Self Care | Payer: BC Managed Care – PPO | Attending: Internal Medicine | Admitting: Internal Medicine

## 2019-04-23 ENCOUNTER — Encounter
Admission: RE | Disposition: A | Discharge status: Home / Self Care | Payer: Self-pay | Source: Home / Self Care | Attending: Internal Medicine

## 2019-04-23 DIAGNOSIS — Z6841 Body Mass Index (BMI) 40.0 and over, adult: Secondary | ICD-10-CM | POA: Insufficient documentation

## 2019-04-23 DIAGNOSIS — I25110 Atherosclerotic heart disease of native coronary artery with unstable angina pectoris: Secondary | ICD-10-CM | POA: Diagnosis not present

## 2019-04-23 DIAGNOSIS — Z8249 Family history of ischemic heart disease and other diseases of the circulatory system: Secondary | ICD-10-CM | POA: Insufficient documentation

## 2019-04-23 DIAGNOSIS — I5189 Other ill-defined heart diseases: Secondary | ICD-10-CM | POA: Diagnosis not present

## 2019-04-23 DIAGNOSIS — Z79899 Other long term (current) drug therapy: Secondary | ICD-10-CM | POA: Insufficient documentation

## 2019-04-23 DIAGNOSIS — Z823 Family history of stroke: Secondary | ICD-10-CM | POA: Diagnosis not present

## 2019-04-23 DIAGNOSIS — E119 Type 2 diabetes mellitus without complications: Secondary | ICD-10-CM | POA: Insufficient documentation

## 2019-04-23 DIAGNOSIS — M1990 Unspecified osteoarthritis, unspecified site: Secondary | ICD-10-CM | POA: Diagnosis not present

## 2019-04-23 DIAGNOSIS — Z7982 Long term (current) use of aspirin: Secondary | ICD-10-CM | POA: Insufficient documentation

## 2019-04-23 DIAGNOSIS — I10 Essential (primary) hypertension: Secondary | ICD-10-CM | POA: Diagnosis not present

## 2019-04-23 DIAGNOSIS — F329 Major depressive disorder, single episode, unspecified: Secondary | ICD-10-CM | POA: Insufficient documentation

## 2019-04-23 DIAGNOSIS — Z23 Encounter for immunization: Secondary | ICD-10-CM | POA: Insufficient documentation

## 2019-04-23 DIAGNOSIS — I200 Unstable angina: Secondary | ICD-10-CM | POA: Diagnosis present

## 2019-04-23 DIAGNOSIS — G4730 Sleep apnea, unspecified: Secondary | ICD-10-CM | POA: Insufficient documentation

## 2019-04-23 DIAGNOSIS — E785 Hyperlipidemia, unspecified: Secondary | ICD-10-CM | POA: Diagnosis not present

## 2019-04-23 DIAGNOSIS — Z955 Presence of coronary angioplasty implant and graft: Secondary | ICD-10-CM | POA: Diagnosis not present

## 2019-04-23 DIAGNOSIS — R079 Chest pain, unspecified: Secondary | ICD-10-CM

## 2019-04-23 DIAGNOSIS — E875 Hyperkalemia: Secondary | ICD-10-CM | POA: Diagnosis not present

## 2019-04-23 DIAGNOSIS — M5136 Other intervertebral disc degeneration, lumbar region: Secondary | ICD-10-CM | POA: Insufficient documentation

## 2019-04-23 DIAGNOSIS — Z87891 Personal history of nicotine dependence: Secondary | ICD-10-CM | POA: Insufficient documentation

## 2019-04-23 HISTORY — PX: LEFT HEART CATH AND CORONARY ANGIOGRAPHY: CATH118249

## 2019-04-23 HISTORY — PX: CORONARY STENT INTERVENTION: CATH118234

## 2019-04-23 LAB — POCT ACTIVATED CLOTTING TIME
Activated Clotting Time: 246 seconds
Activated Clotting Time: 268 seconds

## 2019-04-23 SURGERY — LEFT HEART CATH AND CORONARY ANGIOGRAPHY
Anesthesia: Moderate Sedation

## 2019-04-23 MED ORDER — ASPIRIN EC 81 MG PO TBEC
81.0000 mg | DELAYED_RELEASE_TABLET | Freq: Every day | ORAL | Status: DC
  Administered 2019-04-24: 81 mg via ORAL
  Filled 2019-04-23: qty 1

## 2019-04-23 MED ORDER — VERAPAMIL HCL 2.5 MG/ML IV SOLN
INTRAVENOUS | Status: AC
  Filled 2019-04-23: qty 2

## 2019-04-23 MED ORDER — RAMIPRIL 5 MG PO CAPS
5.0000 mg | ORAL_CAPSULE | Freq: Every day | ORAL | Status: DC
  Administered 2019-04-23: 5 mg via ORAL
  Filled 2019-04-23 (×2): qty 1

## 2019-04-23 MED ORDER — FENTANYL CITRATE (PF) 100 MCG/2ML IJ SOLN
INTRAMUSCULAR | Status: AC
  Filled 2019-04-23: qty 2

## 2019-04-23 MED ORDER — DIPHENHYDRAMINE HCL 50 MG/ML IJ SOLN: 25.0000 mg | Freq: Once | INTRAMUSCULAR | Status: DC

## 2019-04-23 MED ORDER — LABETALOL HCL 5 MG/ML IV SOLN
INTRAVENOUS | Status: AC
  Filled 2019-04-23: qty 4

## 2019-04-23 MED ORDER — SODIUM CHLORIDE 0.9 % WEIGHT BASED INFUSION
3.0000 mL/kg/h | INTRAVENOUS | Status: DC
  Administered 2019-04-23: 3 mL/kg/h via INTRAVENOUS

## 2019-04-23 MED ORDER — MAGNESIUM OXIDE 400 (241.3 MG) MG PO TABS
400.0000 mg | ORAL_TABLET | Freq: Every day | ORAL | Status: DC
  Administered 2019-04-23: 400 mg via ORAL
  Filled 2019-04-23: qty 1

## 2019-04-23 MED ORDER — MIDAZOLAM HCL 2 MG/2ML IJ SOLN
INTRAMUSCULAR | Status: AC
  Filled 2019-04-23: qty 2

## 2019-04-23 MED ORDER — NITROGLYCERIN IN D5W 200-5 MCG/ML-% IV SOLN
INTRAVENOUS | Status: DC | PRN
  Administered 2019-04-23: 20 ug/min via INTRAVENOUS

## 2019-04-23 MED ORDER — ATORVASTATIN CALCIUM 20 MG PO TABS
40.0000 mg | ORAL_TABLET | Freq: Every day | ORAL | Status: DC
  Administered 2019-04-23: 40 mg via ORAL
  Filled 2019-04-23: qty 2

## 2019-04-23 MED ORDER — SODIUM CHLORIDE 0.9% FLUSH
3.0000 mL | Freq: Two times a day (BID) | INTRAVENOUS | Status: DC
  Administered 2019-04-23 – 2019-04-24 (×2): 3 mL via INTRAVENOUS

## 2019-04-23 MED ORDER — ADULT MULTIVITAMIN W/MINERALS CH
1.0000 | ORAL_TABLET | Freq: Every day | ORAL | Status: DC
  Administered 2019-04-23: 1 via ORAL
  Filled 2019-04-23: qty 1

## 2019-04-23 MED ORDER — ESCITALOPRAM OXALATE 10 MG PO TABS
10.0000 mg | ORAL_TABLET | Freq: Every day | ORAL | Status: DC
  Administered 2019-04-23: 10 mg via ORAL
  Filled 2019-04-23 (×2): qty 1

## 2019-04-23 MED ORDER — HEPARIN (PORCINE) IN NACL 1000-0.9 UT/500ML-% IV SOLN
INTRAVENOUS | Status: DC | PRN
  Administered 2019-04-23: 500 mL

## 2019-04-23 MED ORDER — ATROPINE SULFATE 1 MG/10ML IJ SOSY
PREFILLED_SYRINGE | INTRAMUSCULAR | Status: AC
  Filled 2019-04-23: qty 10

## 2019-04-23 MED ORDER — HEPARIN SODIUM (PORCINE) 1000 UNIT/ML IJ SOLN
INTRAMUSCULAR | Status: AC
  Filled 2019-04-23: qty 1

## 2019-04-23 MED ORDER — ACETAMINOPHEN 325 MG PO TABS
650.0000 mg | ORAL_TABLET | ORAL | Status: DC | PRN
  Administered 2019-04-23: 12:00:00 650 mg via ORAL

## 2019-04-23 MED ORDER — METHYLPREDNISOLONE SODIUM SUCC 125 MG IJ SOLR
125.0000 mg | Freq: Once | INTRAMUSCULAR | Status: AC
  Administered 2019-04-23: 125 mg via INTRAVENOUS

## 2019-04-23 MED ORDER — NITROGLYCERIN 0.4 MG SL SUBL
0.4000 mg | SUBLINGUAL_TABLET | SUBLINGUAL | Status: DC | PRN
  Administered 2019-04-23 (×2): 0.4 mg via SUBLINGUAL
  Filled 2019-04-23 (×2): qty 1

## 2019-04-23 MED ORDER — ONDANSETRON HCL 4 MG/2ML IJ SOLN: 4.0000 mg | Freq: Four times a day (QID) | INTRAMUSCULAR | Status: DC | PRN

## 2019-04-23 MED ORDER — CLOPIDOGREL BISULFATE 75 MG PO TABS
75.0000 mg | ORAL_TABLET | Freq: Every day | ORAL | Status: DC
  Administered 2019-04-24: 75 mg via ORAL
  Filled 2019-04-23: qty 1

## 2019-04-23 MED ORDER — PNEUMOCOCCAL VAC POLYVALENT 25 MCG/0.5ML IJ INJ
0.5000 mL | INJECTION | INTRAMUSCULAR | Status: AC
  Administered 2019-04-24: 0.5 mL via INTRAMUSCULAR
  Filled 2019-04-23: qty 0.5

## 2019-04-23 MED ORDER — IOHEXOL 300 MG/ML  SOLN
INTRAMUSCULAR | Status: DC | PRN
  Administered 2019-04-23: 155 mL via INTRAVENOUS

## 2019-04-23 MED ORDER — DIPHENHYDRAMINE HCL 50 MG/ML IJ SOLN
INTRAMUSCULAR | Status: AC
  Filled 2019-04-23: qty 1

## 2019-04-23 MED ORDER — NAPROXEN 500 MG PO TABS
500.0000 mg | ORAL_TABLET | Freq: Once | ORAL | Status: AC
  Administered 2019-04-23: 500 mg via ORAL
  Filled 2019-04-23: qty 1

## 2019-04-23 MED ORDER — HEPARIN (PORCINE) IN NACL 1000-0.9 UT/500ML-% IV SOLN
INTRAVENOUS | Status: AC
  Filled 2019-04-23: qty 1000

## 2019-04-23 MED ORDER — LABETALOL HCL 5 MG/ML IV SOLN
INTRAVENOUS | Status: DC | PRN
  Administered 2019-04-23: 10 mg via INTRAVENOUS

## 2019-04-23 MED ORDER — SODIUM CHLORIDE 0.9 % IV SOLN: 250.0000 mL | INTRAVENOUS | Status: DC | PRN

## 2019-04-23 MED ORDER — PANTOPRAZOLE SODIUM 40 MG PO TBEC
40.0000 mg | DELAYED_RELEASE_TABLET | Freq: Every day | ORAL | Status: DC
  Administered 2019-04-23: 40 mg via ORAL
  Filled 2019-04-23: qty 1

## 2019-04-23 MED ORDER — COQ10 200 MG PO CAPS: 200.0000 mg | ORAL_CAPSULE | Freq: Every day | ORAL | Status: DC

## 2019-04-23 MED ORDER — OXYCODONE-ACETAMINOPHEN 5-325 MG PO TABS
1.0000 | ORAL_TABLET | Freq: Once | ORAL | Status: AC
  Administered 2019-04-23: 1 via ORAL

## 2019-04-23 MED ORDER — SODIUM CHLORIDE 0.9% FLUSH: 3.0000 mL | INTRAVENOUS | Status: DC | PRN

## 2019-04-23 MED ORDER — CLOPIDOGREL BISULFATE 75 MG PO TABS
ORAL_TABLET | ORAL | Status: AC
  Filled 2019-04-23: qty 8

## 2019-04-23 MED ORDER — ASPIRIN 81 MG PO CHEW
CHEWABLE_TABLET | ORAL | Status: AC
  Administered 2019-04-23: 81 mg via ORAL
  Filled 2019-04-23: qty 1

## 2019-04-23 MED ORDER — MIDAZOLAM HCL 2 MG/2ML IJ SOLN
INTRAMUSCULAR | Status: DC | PRN
  Administered 2019-04-23 (×3): 1 mg via INTRAVENOUS

## 2019-04-23 MED ORDER — NITROGLYCERIN IN D5W 200-5 MCG/ML-% IV SOLN
0.0000 ug/min | INTRAVENOUS | Status: DC
  Administered 2019-04-23: 0 ug/min via INTRAVENOUS

## 2019-04-23 MED ORDER — ACETAMINOPHEN 325 MG PO TABS
ORAL_TABLET | ORAL | Status: AC
  Administered 2019-04-23: 650 mg via ORAL
  Filled 2019-04-23: qty 2

## 2019-04-23 MED ORDER — SODIUM CHLORIDE 0.9 % WEIGHT BASED INFUSION: 1.0000 mL/kg/h | INTRAVENOUS | Status: DC

## 2019-04-23 MED ORDER — ASPIRIN 81 MG PO CHEW
81.0000 mg | CHEWABLE_TABLET | ORAL | Status: AC
  Administered 2019-04-23: 08:00:00 81 mg via ORAL

## 2019-04-23 MED ORDER — HEPARIN SODIUM (PORCINE) 1000 UNIT/ML IJ SOLN
INTRAMUSCULAR | Status: DC | PRN
  Administered 2019-04-23: 2000 [IU] via INTRAVENOUS
  Administered 2019-04-23: 9000 [IU] via INTRAVENOUS
  Administered 2019-04-23: 3000 [IU] via INTRAVENOUS

## 2019-04-23 MED ORDER — NITROGLYCERIN 1 MG/10 ML FOR IR/CATH LAB
INTRA_ARTERIAL | Status: DC | PRN
  Administered 2019-04-23 (×4): 200 ug

## 2019-04-23 MED ORDER — ISOSORBIDE MONONITRATE ER 30 MG PO TB24
15.0000 mg | ORAL_TABLET | Freq: Every day | ORAL | Status: DC
  Administered 2019-04-24: 15 mg via ORAL
  Filled 2019-04-23: qty 1

## 2019-04-23 MED ORDER — LABETALOL HCL 5 MG/ML IV SOLN: 10.0000 mg | INTRAVENOUS | Status: AC | PRN

## 2019-04-23 MED ORDER — MORPHINE SULFATE (PF) 2 MG/ML IV SOLN
2.0000 mg | Freq: Once | INTRAVENOUS | Status: AC
  Administered 2019-04-23: 2 mg via INTRAVENOUS
  Filled 2019-04-23: qty 1

## 2019-04-23 MED ORDER — CLOPIDOGREL BISULFATE 75 MG PO TABS
ORAL_TABLET | ORAL | Status: DC | PRN
  Administered 2019-04-23: 600 mg via ORAL

## 2019-04-23 MED ORDER — METHYLPREDNISOLONE SODIUM SUCC 125 MG IJ SOLR
INTRAMUSCULAR | Status: AC
  Administered 2019-04-23: 125 mg via INTRAVENOUS
  Filled 2019-04-23: qty 2

## 2019-04-23 MED ORDER — HYDRALAZINE HCL 20 MG/ML IJ SOLN: 10.0000 mg | INTRAMUSCULAR | Status: AC | PRN

## 2019-04-23 MED ORDER — FENTANYL CITRATE (PF) 100 MCG/2ML IJ SOLN
INTRAMUSCULAR | Status: DC | PRN
  Administered 2019-04-23: 50 ug via INTRAVENOUS
  Administered 2019-04-23 (×2): 25 ug via INTRAVENOUS

## 2019-04-23 MED ORDER — OXYCODONE-ACETAMINOPHEN 5-325 MG PO TABS
ORAL_TABLET | ORAL | Status: AC
  Filled 2019-04-23: qty 1

## 2019-04-23 SURGICAL SUPPLY — 20 items
BALLN TREK RX 2.5X12 (BALLOONS) ×3
BALLN ~~LOC~~ TREK RX 3.75X12 (BALLOONS) ×3
BALLOON TREK RX 2.5X12 (BALLOONS) IMPLANT
BALLOON ~~LOC~~ TREK RX 3.75X12 (BALLOONS) IMPLANT
CANNULA 5F STIFF (CANNULA) ×2 IMPLANT
CATH INFINITI 5FR ANG PIGTAIL (CATHETERS) ×2 IMPLANT
CATH INFINITI 5FR JL4 (CATHETERS) ×2 IMPLANT
CATH INFINITI JR4 5F (CATHETERS) ×2 IMPLANT
CATH VISTA GUIDE 6FR JR4 (CATHETERS) ×2 IMPLANT
DEVICE CLOSURE MYNXGRIP 6/7F (Vascular Products) ×2 IMPLANT
DEVICE INFLAT 30 PLUS (MISCELLANEOUS) ×2 IMPLANT
GLIDESHEATH SLEND SS 6F .021 (SHEATH) ×2 IMPLANT
KIT MANI 3VAL PERCEP (MISCELLANEOUS) ×3 IMPLANT
PACK CARDIAC CATH (CUSTOM PROCEDURE TRAY) ×3 IMPLANT
SHEATH AVANTI 5FR X 11CM (SHEATH) ×4 IMPLANT
SHEATH AVANTI 6FR X 11CM (SHEATH) ×2 IMPLANT
STENT RESOLUTE ONYX 3.5X15 (Permanent Stent) ×2 IMPLANT
WIRE GUIDERIGHT .035X150 (WIRE) ×2 IMPLANT
WIRE ROSEN-J .035X260CM (WIRE) ×2 IMPLANT
WIRE RUNTHROUGH .014X180CM (WIRE) ×4 IMPLANT

## 2019-04-23 NOTE — Progress Notes (Signed)
Spoke with MD about patient's back pain. MD okayed giving one time dose pain medication. Will continue to monitor.

## 2019-04-23 NOTE — Brief Op Note (Signed)
BRIEF CARDIAC CATHETERIZATION NOTE  04/23/2019  11:08 AM  PATIENT:  Brandon Henderson  63 y.o. male  PRE-OPERATIVE DIAGNOSIS:  Accelerating angina  POST-OPERATIVE DIAGNOSIS:  Same  PROCEDURE:  Procedure(s): LEFT HEART CATH AND CORONARY ANGIOGRAPHY (N/A) CORONARY STENT INTERVENTION (N/A)  SURGEON:  Surgeon(s) and Role:    * Shavon Zenz, Harrell Gave, MD - Primary  FINDINGS: 1. Significant 2-vessel coronary artery disease, including long 90% OM2 stenosis (suspect ISR) and 80-90% mid RCA lesion. 2. Moderately elevated left ventricular filling pressure (LVEDP 25-30 mmHg). 3. Normal LVEF. 4. Successful PCI to mid RCA using Resolute Onyx 3.5 x 15 mm DES.  RECOMMENDATIONS: 1. Overnight extended recovery. 2. DAPT with aspirin and clopidogrel for at least 3 months, ideally 6+ months. 3. Aggressive secondary prevention.  Nelva Bush, MD [facility] Pager: [phone]

## 2019-04-23 NOTE — Interval H&P Note (Signed)
History and Physical Interval Note:  04/23/2019 8:11 AM  Brandon Henderson  has presented today for cardiac catheterization, with the diagnosis of accelerating angina.  The various methods of treatment have been discussed with the patient and family. After consideration of risks, benefits and other options for treatment, the patient has consented to  Procedure(s): LEFT HEART CATH AND CORONARY ANGIOGRAPHY (N/A) as a surgical intervention.  The patient's history has been reviewed, patient examined, no change in status, stable for surgery.  Brandon Henderson notes rash following CT scan last month.  He did not have any breathing difficulty or edema.  He had not had any issues with IV contrast in the past.  However, given this reaction, we will premedicate him with methylprednisolone 125 mg IV x 1 and diphenhydramine 25 mg IV x 1.  I have reviewed the patient's chart and labs.  Questions were answered to the patient's satisfaction.    Cath Lab Visit (complete for each Cath Lab visit)  Clinical Evaluation Leading to the Procedure:   ACS: No.  Non-ACS:    Anginal Classification: CCS IV  Anti-ischemic medical therapy: Minimal Therapy (1 class of medications)  Non-Invasive Test Results: Low-risk stress test findings: cardiac mortality <1%/year  Prior CABG: No previous CABG   Brandon Henderson

## 2019-04-23 NOTE — Progress Notes (Signed)
Dr. Saunders Revel at bedside to round post-cath. Patient complaints of chronic back pain 6/10. Naproxen 500mg  order written. Right groin site intact, level 0, no complaints of site pain or chest pain. Will continue to gradually increase HOB & activity level. Patient wife on phone for facetime and she has been updated with no questions. Expected discharge tomorrow around noon.

## 2019-04-24 ENCOUNTER — Encounter: Payer: Self-pay | Admitting: Internal Medicine

## 2019-04-24 DIAGNOSIS — I5189 Other ill-defined heart diseases: Secondary | ICD-10-CM

## 2019-04-24 DIAGNOSIS — E785 Hyperlipidemia, unspecified: Secondary | ICD-10-CM | POA: Diagnosis not present

## 2019-04-24 DIAGNOSIS — Z823 Family history of stroke: Secondary | ICD-10-CM | POA: Diagnosis not present

## 2019-04-24 DIAGNOSIS — G4730 Sleep apnea, unspecified: Secondary | ICD-10-CM | POA: Diagnosis not present

## 2019-04-24 DIAGNOSIS — E875 Hyperkalemia: Secondary | ICD-10-CM | POA: Diagnosis not present

## 2019-04-24 DIAGNOSIS — Z23 Encounter for immunization: Secondary | ICD-10-CM | POA: Diagnosis not present

## 2019-04-24 DIAGNOSIS — Z8249 Family history of ischemic heart disease and other diseases of the circulatory system: Secondary | ICD-10-CM | POA: Diagnosis not present

## 2019-04-24 DIAGNOSIS — Z6841 Body Mass Index (BMI) 40.0 and over, adult: Secondary | ICD-10-CM | POA: Diagnosis not present

## 2019-04-24 DIAGNOSIS — E119 Type 2 diabetes mellitus without complications: Secondary | ICD-10-CM | POA: Diagnosis not present

## 2019-04-24 DIAGNOSIS — Z87891 Personal history of nicotine dependence: Secondary | ICD-10-CM | POA: Diagnosis not present

## 2019-04-24 DIAGNOSIS — Z79899 Other long term (current) drug therapy: Secondary | ICD-10-CM | POA: Diagnosis not present

## 2019-04-24 DIAGNOSIS — I25110 Atherosclerotic heart disease of native coronary artery with unstable angina pectoris: Secondary | ICD-10-CM | POA: Diagnosis not present

## 2019-04-24 DIAGNOSIS — Z955 Presence of coronary angioplasty implant and graft: Secondary | ICD-10-CM | POA: Diagnosis not present

## 2019-04-24 DIAGNOSIS — F329 Major depressive disorder, single episode, unspecified: Secondary | ICD-10-CM | POA: Diagnosis not present

## 2019-04-24 DIAGNOSIS — Z7982 Long term (current) use of aspirin: Secondary | ICD-10-CM | POA: Diagnosis not present

## 2019-04-24 DIAGNOSIS — I10 Essential (primary) hypertension: Secondary | ICD-10-CM | POA: Diagnosis not present

## 2019-04-24 DIAGNOSIS — M5136 Other intervertebral disc degeneration, lumbar region: Secondary | ICD-10-CM | POA: Diagnosis not present

## 2019-04-24 DIAGNOSIS — M1990 Unspecified osteoarthritis, unspecified site: Secondary | ICD-10-CM | POA: Diagnosis not present

## 2019-04-24 LAB — BASIC METABOLIC PANEL
Anion gap: 8 (ref 5–15)
BUN: 20 mg/dL (ref 8–23)
CO2: 28 mmol/L (ref 22–32)
Calcium: 9.3 mg/dL (ref 8.9–10.3)
Chloride: 104 mmol/L (ref 98–111)
Creatinine, Ser: 0.99 mg/dL (ref 0.61–1.24)
GFR calc Af Amer: >60 mL/min (ref >60)
GFR calc non Af Amer: >60 mL/min (ref >60)
Glucose, Bld: 121 mg/dL — ABNORMAL HIGH (ref 70–99)
Potassium: 5.5 mmol/L — ABNORMAL HIGH (ref 3.5–5.1)
Sodium: 140 mmol/L (ref 135–145)

## 2019-04-24 LAB — CBC
HCT: 40.1 % (ref 39.0–52.0)
Hemoglobin: 12.8 g/dL — ABNORMAL LOW (ref 13.0–17.0)
MCH: 26.6 pg (ref 26.0–34.0)
MCHC: 31.9 g/dL (ref 30.0–36.0)
MCV: 83.4 fL (ref 80.0–100.0)
Platelets: 242 10*3/uL (ref 150–400)
RBC: 4.81 MIL/uL (ref 4.22–5.81)
RDW: 14.1 % (ref 11.5–15.5)
WBC: 20 10*3/uL — ABNORMAL HIGH (ref 4.0–10.5)
nRBC: 0 % (ref 0.0–0.2)

## 2019-04-24 MED ORDER — CLOPIDOGREL BISULFATE 75 MG PO TABS: 75.0000 mg | ORAL_TABLET | Freq: Every day | ORAL | 3 refills | Status: AC

## 2019-04-24 MED ORDER — FUROSEMIDE 40 MG PO TABS
40.0000 mg | ORAL_TABLET | Freq: Every day | ORAL | Status: DC
  Administered 2019-04-24: 40 mg via ORAL
  Filled 2019-04-24: qty 1

## 2019-04-24 MED ORDER — FUROSEMIDE 40 MG PO TABS: 40.0000 mg | ORAL_TABLET | Freq: Every day | ORAL | 3 refills | Status: DC

## 2019-04-24 NOTE — Progress Notes (Signed)
Pneumovax given left deltoid

## 2019-04-24 NOTE — Plan of Care (Signed)
  Problem: Activity: Goal: Ability to return to baseline activity level will improve Outcome: Progressing   Problem: Cardiovascular: Goal: Vascular access site(s) Level 0-1 will be maintained Outcome: Progressing   Problem: Clinical Measurements: Goal: Ability to maintain clinical measurements within normal limits will improve Outcome: Progressing   Problem: Activity: Goal: Risk for activity intolerance will decrease Outcome: Progressing   Problem: Pain Managment: Goal: General experience of comfort will improve Outcome: Progressing

## 2019-04-24 NOTE — Progress Notes (Addendum)
Cardiac Rehab Navigator/ Exercise Physiologist Note  "Angioplasty and Stent" booklet given and reviewed with patient. Discussed the definition of CAD. Reviewed the location of CAD and where his stent was placed. Informed patient he will be given a stent card. Explained the purpose of the stent card. Instructed patient to keep stent card in his wallet.  Discussed modifiable risk factors including controlling blood pressure, cholesterol, and blood sugar; following heart healthy diet; maintaining healthy weight; exercise; and smoking cessation, not applicable.  Discussed cardiac medications including rationale for taking, mechanisms of action, and side effects. Stressed the importance of taking medications as prescribed.  Discussed emergency plan for heart attack symptoms. Patient verbalized understanding of need to call 911 and not to drive himself to ER if having cardiac symptoms / chest pain. Patient verbalized how to use his NTG and when to call 911.   Diet of low sodium, low fat, low cholesterol heart healthy diet discussed. Information on diet provided.   Smoking Cessation - Patient is a FORMER every day smoker.  Exercise - Benefits of exercised discussed. Patient is very active in his job but does not exercise and is not active outside of work. Informed patient his cardiologist has referred him to outpatient Cardiac Rehab. An overview of the program was provided. Brochure, informational letter, initial medical review with RN by phone discussed and CPT billing codes given to patient. Patient is interested in participating. Patient plans to check with his insurance company to see what his out-of-pocket expenses will be. The Cardiac Rehab dept will contact patient as soon as the department reopens. 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 Patient will check with employer to see if they will support his  attendance to Cardiac Rehab.   Patient was appreciative of the information.   Patient ambulating in the hallway with RN after rounding.   Jasper Loser, [facility]  Exercise Physiologist Department Phone #: [phone] Fax: [phone]  Direct Line [phone] Email Address: [email or web address]

## 2019-04-24 NOTE — Discharge Instructions (Signed)
Coronary Angiogram With Stent, Care After °This sheet gives you information about how to care for yourself after your procedure. Your health care provider may also give you more specific instructions. If you have problems or questions, contact your health care provider. °What can I expect after the procedure? °After your procedure, it is common to have: °· Bruising in the area where a small, thin tube (catheter) was inserted. This usually fades within 1-2 weeks. °· Blood collecting in the tissue (hematoma) that may be painful to the touch. It should usually decrease in size and tenderness within 1-2 weeks. °Follow these instructions at home: °Insertion area care °· Do not take baths, swim, or use a hot tub until your health care provider approves. °· You may shower 24-48 hours after the procedure or as directed by your health care provider. °· Follow instructions from your health care provider about how to take care of your incision. Make sure you: °? Wash your hands with soap and water before you change your bandage (dressing). If soap and water are not available, use hand sanitizer. °? Change your dressing as told by your health care provider. °? Leave stitches (sutures), skin glue, or adhesive strips in place. These skin closures may need to stay in place for 2 weeks or longer. If adhesive strip edges start to loosen and curl up, you may trim the loose edges. Do not remove adhesive strips completely unless your health care provider tells you to do that. °· Remove the bandage (dressing) and gently wash the catheter insertion site with plain soap and water. °· Pat the area dry with a clean towel. Do not rub the area, because that may cause bleeding. °· Do not apply powder or lotion to the incision area. °· Check your incision area every day for signs of infection. Check for: °? More redness, swelling, or pain. °? More fluid or blood. °? Warmth. °? Pus or a bad smell. °Activity °· Do not drive for 24 hours if you  were given a medicine to help you relax (sedative). °· Do not lift anything that is heavier than 10 lb (4.5 kg) for 5 days after your procedure or as directed by your health care provider. °· Ask your health care provider when it is okay for you: °? To return to work or school. °? To resume usual physical activities or sports. °? To resume sexual activity. °Eating and drinking ° °· Eat a heart-healthy diet. This should include plenty of fresh fruits and vegetables. °· Avoid the following types of food: °? Food that is high in salt. °? Canned or highly processed food. °? Food that is high in saturated fat or sugar. °? Fried food. °· Limit alcohol intake to no more than 1 drink a day for non-pregnant women and 2 drinks a day for men. One drink equals 12 oz of beer, 5 oz of wine, or 1½ oz of hard liquor. °Lifestyle ° °· Do not use any products that contain nicotine or tobacco, such as cigarettes and e-cigarettes. If you need help quitting, ask your health care provider. °· Take steps to manage and control your weight. °· Get regular exercise. °· Manage your blood pressure. °· Manage other health problems, such as diabetes. °General instructions °· Take over-the-counter and prescription medicines only as told by your health care provider. Blood thinners may be prescribed after your procedure to improve blood flow through the stent. °· If you need an MRI after your heart stent has been placed,   be sure to tell the health care provider who orders the MRI that you have a heart stent. °· Keep all follow-up visits as directed by your health care provider. This is important. °Contact a health care provider if: °· You have a fever. °· You have chills. °· You have increased bleeding from the catheter insertion area. Hold pressure on the area. °Get help right away if: °· You develop chest pain or shortness of breath. °· You feel faint or you pass out. °· You have unusual pain at the catheter insertion area. °· You have redness,  warmth, or swelling at the catheter insertion area. °· You have drainage (other than a small amount of blood on the dressing) from the catheter insertion area. °· The catheter insertion area is bleeding, and the bleeding does not stop after 30 minutes of holding steady pressure on the area. °· You develop bleeding from any other place, such as from your rectum. There may be bright red blood in your urine or stool, or it may appear as black, tarry stool. °This information is not intended to replace advice given to you by your health care provider. Make sure you discuss any questions you have with your health care provider. °Document Released: 04/22/2005 Document Revised: 09/15/2017 Document Reviewed: 06/30/2016 °Elsevier Patient Education © 2020 [employer] ° °

## 2019-04-24 NOTE — Discharge Summary (Signed)
Discharge Summary    Patient ID: Brandon Henderson MRN: [id number]; DOB: Jul 12, 1956  Admit date: 04/23/2019 Discharge date: 04/24/2019  Primary Care Provider: Kirk Ruths, MD  Primary Cardiologist: Ida Rogue, MD  Primary Electrophysiologist:  None   Discharge Diagnoses    Principal Problem:   Accelerating angina [facility])   Allergies Allergies  Allergen Reactions   Contrast Media [Iodinated Diagnostic Agents] Rash    Diagnostic Studies/Procedures    LHC/PCI (04/23/2019): Conclusions: 1. Significant two-vessel coronary artery disease, including long 90% OM2 stenosis (suspect ISR) and 80-90% mid RCA lesion. 2. Moderately elevated left ventricular filling pressure (LVEDP 25-30 mmHg). 3. Normal left ventricular systolic function. 4. Mild LVOT/AoV gradient (15 mmHg peak-to-peak gradient). 5. Successful PCI to mid RCA using Resolute Onyx 3.5 x 15 mm drug-eluting stent with 0% residual stenosis and TIMI-3 flow. 6. Small right radial artery not suitable for catheterization. 7. Small right groin hematoma following MynxGrip deployment, successfully controlled with manual compression.  Recommendations: 1. Overnight extended recovery. 2. Dual antiplatelet with aspirin and clopidogrel for a minimum of 3 months, ideally 6+ months. 3. Aggressive secondary prevention. 4. Medical therapy of OM2 stenosis, given relatively small vessel size/territory.  If the patient has refractory angina, PCI could be considered.  Diagnostic Dominance: Right  Intervention    _____________   History of Present Illness     63 y.o. year-old man with history of CAD status post prior bare-metal stenting of the second obtuse marginal, hypertension, hyperlipidemia, sleep apnea, obesity, remote tobacco abuse, diabetes, carotid arterial disease, depression, arthritis, referred for evaluation of progressive chest pain with exertion (now intermittently present at rest), consistent with accelerating  angina.  He also has chronic back pain and will likely need surgical intervention in the coming months.  Myocardial perfusion stress test in 12/2018 was low risk.  However, given progressive chest pain, he was referred for cardiac catheterization and possible PCI.  LHC showed 2-vessel CAD, as outlined above.  Mr. Brandon Henderson underwent successful PCI to the mid RCA with a single drug-eluting stent.  He had transient hypotension following the case due to development of a small right groin access site hematoma and vagal reaction.  This resolved with manual compression and IV fluid bolus.  Hospital Course     Consultants: None   Patient was admitted to telemetry for overnight extended recovery.  He developed substernal chest pain at rest in the late evening, that was unresponsive to NTG and morphine.  He ultimately fell asleep and noted that the pain had resolved when he woke up.  He denies chest pain, shortness of breath, palpitations, and lightheadedness.  His right femoral arteriotomy site is clean without hematoma or bruit.  Mr. Brandon Henderson has ambulated several times in the hall with difficulty or recurrence of chest pain and is stable for discharge.  Of note, furosemide 40 mg daily was added on the day of discharge due to moderately elevated LVEDP noted at the time of catheterization, suggestive of diastolic dysfunction.  Mild hyperkalemia was also noted on the morning of discharge.  A BMP should be checked when the patient follows up in the office next week. _____________  Discharge Vitals Blood pressure (!) 148/59, pulse (!) 57, temperature 97.7 F (36.5 C), temperature source Oral, resp. rate 19, height 5\' 7"  (1.702 m), weight 117.8 kg, SpO2 100 %.  Filed Weights   04/23/19 0728 04/24/19 0338  Weight: 117.6 kg 117.8 kg    Labs & Radiologic Studies    CBC Recent Labs  04/24/19 0644  WBC 20.0*  HGB 12.8*  HCT 40.1  MCV 83.4  PLT 867   Basic Metabolic Panel Recent Labs    04/24/19 0644  NA  140  K 5.5*  CL 104  CO2 28  GLUCOSE 121*  BUN 20  CREATININE 0.99  CALCIUM 9.3   Liver Function Tests No results for input(s): AST, ALT, ALKPHOS, BILITOT, PROT, ALBUMIN in the last 72 hours. No results for input(s): LIPASE, AMYLASE in the last 72 hours. Cardiac Enzymes No results for input(s): CKTOTAL, CKMB, CKMBINDEX, TROPONINI in the last 72 hours. BNP Invalid input(s): POCBNP D-Dimer No results for input(s): DDIMER in the last 72 hours. Hemoglobin A1C No results for input(s): HGBA1C in the last 72 hours. Fasting Lipid Panel No results for input(s): CHOL, HDL, LDLCALC, TRIG, CHOLHDL, LDLDIRECT in the last 72 hours. Thyroid Function Tests No results for input(s): TSH, T4TOTAL, T3FREE, THYROIDAB in the last 72 hours.  Invalid input(s): FREET3 _____________  Ct Angio Chest Pe W Or Wo Contrast  Result Date: 04/08/2019 CLINICAL DATA:  Chest pain and pressure. EXAM: CT ANGIOGRAPHY CHEST WITH CONTRAST TECHNIQUE: Multidetector CT imaging of the chest was performed using the standard protocol during bolus administration of intravenous contrast. Multiplanar CT image reconstructions and MIPs were obtained to evaluate the vascular anatomy. CONTRAST:  80mL OMNIPAQUE IOHEXOL 350 MG/ML SOLN COMPARISON:  None. FINDINGS: Cardiovascular: No significant filling defect or pulmonary embolus demonstrated within the pulmonary vasculature by CTA. Minor aortic atherosclerosis without aneurysm. Patent 3 vessel arch anatomy. No dissection. Native coronary atherosclerosis noted. Mild cardiac enlargement. No pericardial effusion. Mediastinum/Nodes: No enlarged mediastinal, hilar, or axillary lymph nodes. Thyroid gland, trachea, and esophagus demonstrate no significant findings. Lungs/Pleura: Dependent basilar hypoventilatory changes and low lung volumes. No acute focal pneumonia, airspace process, collapse or consolidation. Trachea and central airways are patent. No interstitial change or edema. No pleural  abnormality, effusion, or pneumothorax. Upper Abdomen: Diffuse fatty infiltration of the liver. No acute upper abdominal finding. Musculoskeletal: Degenerative changes noted of the spine. Remote right shoulder surgery with fixation screws. No acute osseous finding. Review of the MIP images confirms the above findings. IMPRESSION: No acute significant pulmonary embolus by CTA. Cardiomegaly without CHF Low lung volumes with basilar hypoventilatory changes No other acute intrathoracic finding Hepatic steatosis Aortic Atherosclerosis (ICD10-I70.0). Electronically Signed   By: Jerilynn Mages.  Shick M.D.   On: 04/08/2019 17:39   Disposition   Pt is being discharged home today in good condition.  He is scheduled for f/u with [name], NP on 05/12/2019.  A BMP will need to be drawn at that time.  Follow-up Plans & Appointments     Discharge Instructions    AMB Referral to Cardiac Rehabilitation - Phase II   Complete by: As directed    Diagnosis: Coronary Stents   After initial evaluation and assessments completed: Virtual Based Care may be provided alone or in conjunction with Phase 2 Cardiac Rehab based on patient barriers.: Yes   Call MD for:   Complete by: As directed    Chest pain requiring nitroglycerin, lightheadedness, and shortness of breath.   Call MD for:  redness, tenderness, or signs of infection (pain, swelling, redness, odor or green/yellow discharge around incision site)   Complete by: As directed    Call MD for:  severe uncontrolled pain   Complete by: As directed    Diet - low sodium heart healthy   Complete by: As directed    Increase activity slowly   Complete by:  As directed      Office visit with [name], NP, at [facility] in [place] on 05/02/2019 @ 8:30 AM.  Basic metabolic panel will need to be drawn at that time.  Discharge Medications   Allergies as of 04/24/2019      Reactions   Contrast Media [iodinated Diagnostic Agents] Rash      Medication List    TAKE these  medications   aspirin EC 81 MG tablet Take 81 mg by mouth at bedtime.   atorvastatin 40 MG tablet Commonly known as: LIPITOR TAKE 1 TABLET BY MOUTH ONCE A DAY What changed: when to take this   clopidogrel 75 MG tablet Commonly known as: PLAVIX Take 1 tablet (75 mg total) by mouth daily with breakfast. Start taking on: April 25, 2019   CoQ10 200 MG Caps Take 200 mg by mouth at bedtime.   COSAMIN DS PO Take 1 tablet by mouth at bedtime.   escitalopram 10 MG tablet Commonly known as: LEXAPRO Take 10 mg by mouth at bedtime.   furosemide 40 MG tablet Commonly known as: LASIX Take 1 tablet (40 mg total) by mouth daily. Start taking on: April 25, 2019   isosorbide mononitrate 30 MG 24 hr tablet Commonly known as: IMDUR Take 0.5 tablets (15 mg total) by mouth daily.   magnesium oxide 400 MG tablet Commonly known as: MAG-OX Take 400 mg by mouth at bedtime.   multivitamin with minerals Tabs tablet Take 1 tablet by mouth at bedtime.   naproxen sodium 220 MG tablet Commonly known as: ALEVE Take 440 mg by mouth daily as needed (on work days ONLY).   nitroGLYCERIN 0.4 MG SL tablet Commonly known as: NITROSTAT Place 1 tablet (0.4 mg total) under the tongue every 5 (five) minutes as needed for chest pain.   pantoprazole 40 MG tablet Commonly known as: PROTONIX Take 40 mg by mouth at bedtime.   ramipril 5 MG capsule Commonly known as: ALTACE TAKE 1 CAPSULE BY MOUTH ONCE DAILY What changed: when to take this   vitamin C 1000 MG tablet Take 1,000 mg by mouth at bedtime.        Acute coronary syndrome (MI, NSTEMI, STEMI, etc) this admission?: No.    Outstanding Labs/Studies   None.  Duration of Discharge Encounter   Greater than 30 minutes including physician time.  Signed, Nelva Bush, MD 04/24/2019, 1:08 PM

## 2019-04-24 NOTE — Progress Notes (Signed)
Right groin bandage removed and bandaid applied. No bleeding redness, swelling or drainage noted. Pt given instructions regarding care; may shower, no tub baths for 3 days. Notify provider for bleeding or swelling greater than walnut. Verbalized understanding. Also discussed in detail when to call provider re: CP unrelieved by NTG.

## 2019-04-24 NOTE — Progress Notes (Signed)
Accompanied to DC by Nurse Tech. Pt in NAD.

## 2019-04-24 NOTE — Progress Notes (Signed)
Progress Note  Patient Name: Brandon Henderson Date of Encounter: 04/24/2019  Primary Cardiologist: Ida Rogue, MD   Subjective   Feels well this morning but had substernal chest pressure last night.  This did not improve with SL NTG x 2 or IV morphine.  Patient was eventually able to fall asleep and notes that pain was gone when he awoke.  No shortness of breath.  No pain at right femoral arteriotomy site.  Inpatient Medications    Scheduled Meds: . aspirin EC  81 mg Oral QHS  . atorvastatin  40 mg Oral QHS  . clopidogrel  75 mg Oral Q breakfast  . escitalopram  10 mg Oral QHS  . furosemide  40 mg Oral Daily  . isosorbide mononitrate  15 mg Oral Daily  . magnesium oxide  400 mg Oral QHS  . multivitamin with minerals  1 tablet Oral QHS  . pantoprazole  40 mg Oral QHS  . pneumococcal 23 valent vaccine  0.5 mL Intramuscular Tomorrow-1000  . ramipril  5 mg Oral QHS  . sodium chloride flush  3 mL Intravenous Q12H  . sodium chloride flush  3 mL Intravenous Q12H   Continuous Infusions: . sodium chloride Stopped (04/23/19 1700)  . nitroGLYCERIN 0 mcg/min (04/23/19 1400)   PRN Meds: sodium chloride, acetaminophen, nitroGLYCERIN, ondansetron (ZOFRAN) IV, sodium chloride flush   Vital Signs    Vitals:   04/23/19 2254 04/23/19 2259 04/24/19 0338 04/24/19 0721  BP: 124/67 (!) 116/59 (!) 142/78 (!) 148/59  Pulse: 77 83 77 (!) 57  Resp:   20 19  Temp:   97.9 F (36.6 C) 97.7 F (36.5 C)  TempSrc:   Oral Oral  SpO2:   97% 100%  Weight:   117.8 kg   Height:        Intake/Output Summary (Last 24 hours) at 04/24/2019 0743 Last data filed at 04/24/2019 0602 Gross per 24 hour  Intake 281.33 ml  Output 1000 ml  Net -718.67 ml   Last 3 Weights 04/24/2019 04/23/2019 04/12/2019  Weight (lbs) 259 lb 11.2 oz 259 lb 4.2 oz 259 lb 4 oz  Weight (kg) 117.8 kg 117.6 kg 117.595 kg      Telemetry    NSR - Personally Reviewed  ECG    NSR without abnormalities - Personally Reviewed  Physical Exam   GEN: No acute distress.   Neck: No JVD Cardiac: RRR, no murmurs, rubs, or gallops.  Respiratory: Clear to auscultation bilaterally. GI: Soft, nontender, non-distended  MS: No edema; No deformity.  Right femoral arteriotomy site covered with clean dressing.  No hematoma or bruit. Neuro:  Nonfocal  Psych: Normal affect   Labs    High Sensitivity Troponin:  No results for input(s): TROPONINIHS in the last 720 hours.    Cardiac EnzymesNo results for input(s): TROPONINI in the last 168 hours. No results for input(s): TROPIPOC in the last 168 hours.   Chemistry Recent Labs  Lab 04/24/19 0644  NA 140  K 5.5*  CL 104  CO2 28  GLUCOSE 121*  BUN 20  CREATININE 0.99  CALCIUM 9.3  GFRNONAA >60  GFRAA >60  ANIONGAP 8     Hematology Recent Labs  Lab 04/24/19 0644  WBC 20.0*  RBC 4.81  HGB 12.8*  HCT 40.1  MCV 83.4  MCH 26.6  MCHC 31.9  RDW 14.1  PLT 242    BNPNo results for input(s): BNP, PROBNP in the last 168 hours.   DDimer No results  for input(s): DDIMER in the last 168 hours.   Radiology    No results found.  Cardiac Studies   LHC/PCI (04/23/2019): 1. Significant two-vessel coronary artery disease, including long 90% OM2 stenosis (suspect ISR) and 80-90% mid RCA lesion. 2. Moderately elevated left ventricular filling pressure (LVEDP 25-30 mmHg). 3. Normal left ventricular systolic function. 4. Mild LVOT/AoV gradient (15 mmHg peak-to-peak gradient). 5. Successful PCI to mid RCA using Resolute Onyx 3.5 x 15 mm drug-eluting stent with 0% residual stenosis and TIMI-3 flow. 6. Small right radial artery not suitable for catheterization. 7. Small right groin hematoma following MynxGrip deployment, successfully controlled with manual compression.  Patient Profile     63 y.o. male with history of CAD status post prior bare-metal stenting of the second obtuse marginal, hypertension, hyperlipidemia, sleep apnea, obesity, remote tobacco abuse, diabetes,  carotid arterial disease, depression, arthritis, with accelerating angina s/p PCI to mid RCA yesterday.  Assessment & Plan    Accelerating angina: Patient feels well this morning but had CP at rest overnight that was unresponsive to NTG and morphine.  EKG this morning is normal.  Pain may have been non-cardiac or could be due to residual disease involving small to moderate caliber OM2.  ASA and clopidogrel for minimum of 3 months, though I favor 6-12 months, as tolerated.  Start isosorbide mononitrate 15 mg daily (previously prescribed but patient never started).  Aggressive secondary prevention.  I will have the patient ambulate in the hall today.  If he has recurrent chest pain despite escalation of medical therapy, we will need to consider PCI to OM2 before discharge.  Chronic diastolic heart failure: LVEDP noted to be moderately elevated on cath yesterday, which could also be contributing to chest pain overnight.  Start furosemide 40 mg PO daily.  Continue ramipril for BP control.  Hypertension: BP labile.  Start isosorbide mononitrate 15 mg daily.  Continue ramipril 5 mg daily.  Hyperkalemia: Potassium mildly elevated on BMP today.  He did not receive any potassium supplementation or new medications yesterday.  Continue ramipril at current dose.  Start furosemide, as above.  Patient will need BMP in ~1 week for follow-up of potassium and renal function.  For questions or updates, please contact [place] Please consult [email or web address] for contact info under [facility].  Signed, Nelva Bush, MD  04/24/2019, 7:43 AM

## 2019-04-25 DIAGNOSIS — M1711 Unilateral primary osteoarthritis, right knee: Secondary | ICD-10-CM | POA: Diagnosis not present

## 2019-04-29 ENCOUNTER — Telehealth: Payer: Self-pay | Admitting: Cardiovascular Disease

## 2019-04-29 NOTE — Telephone Encounter (Signed)
New Message  Pt c/o of Chest Pain: STAT if CP now or developed within 24 hours  1. Are you having CP right now? Yes, states that it feels more pressure  2. Are you experiencing any other symptoms (ex. SOB, nausea, vomiting, sweating)? Yes, SOB and Headaches  3. How long have you been experiencing CP? The evening of 04/22/09   4. Is your CP continuous or coming and going? Coming and Going  5. Have you taken Nitroglycerin? Yes, July 10 at 11 am (took 2 pills within 10 mins and bp dropped to 78/56 pulse 99) ?

## 2019-04-29 NOTE — Telephone Encounter (Signed)
Known small vessel OM2 disease.   Could potentially be playing a role.  Increase imdur to  A whole tab (30mg ) daily in an effort to reduce c/p and limit need for  Sl ntg.  F/u  Thursday as planned.  If recurrent/recalcitrant symptoms, he may need to present to the ED.

## 2019-04-29 NOTE — Telephone Encounter (Signed)
Spoke with pt and on 04/26/19 had chest pressure from 11:15 am to about 2:00 pm Pt had taken 2 ntg with little relief was afraid to take 3rd as B/P was 78/56 and HR was 99.Per pt has noted since cath is having this chest pressure off and on and this occurs at rest as where as before cath only had discomfort with exertion.Per pt is suppose to return to work tom but does not feel he can.Pt is wanting to see Dr Rockey Situ appears pt has appt with [name] NP on Thursday Will forward to Dr Rockey Situ and Danna Hefty to review .Adonis Housekeeper

## 2019-04-29 NOTE — Telephone Encounter (Signed)
Please see note below. 

## 2019-04-29 NOTE — Telephone Encounter (Signed)
Pt aware of recommendations and agrees with plan ./cy 

## 2019-05-02 ENCOUNTER — Encounter: Payer: Self-pay | Admitting: General Practice

## 2019-05-02 ENCOUNTER — Ambulatory Visit: Payer: BC Managed Care – PPO | Admitting: General Practice

## 2019-05-02 ENCOUNTER — Other Ambulatory Visit: Payer: Self-pay

## 2019-05-02 VITALS — BP 110/62 | HR 87 | Ht 67.0 in | Wt 257.2 lb

## 2019-05-02 DIAGNOSIS — I25110 Atherosclerotic heart disease of native coronary artery with unstable angina pectoris: Secondary | ICD-10-CM | POA: Diagnosis not present

## 2019-05-02 DIAGNOSIS — I10 Essential (primary) hypertension: Secondary | ICD-10-CM | POA: Diagnosis not present

## 2019-05-02 DIAGNOSIS — I200 Unstable angina: Secondary | ICD-10-CM

## 2019-05-02 DIAGNOSIS — E119 Type 2 diabetes mellitus without complications: Secondary | ICD-10-CM

## 2019-05-02 DIAGNOSIS — E782 Mixed hyperlipidemia: Secondary | ICD-10-CM | POA: Diagnosis not present

## 2019-05-02 DIAGNOSIS — K449 Diaphragmatic hernia without obstruction or gangrene: Secondary | ICD-10-CM

## 2019-05-02 MED ORDER — ISOSORBIDE MONONITRATE ER 30 MG PO TB24: 30.0000 mg | ORAL_TABLET | Freq: Every day | ORAL | 3 refills | Status: AC

## 2019-05-02 NOTE — Patient Instructions (Signed)
Medication Instructions:  INCREASE: Isosorbide (Imdur) to 30 mg once a day  If you need a refill on your cardiac medications before your next appointment, please call your pharmacy.   Lab work: None  If you have labs (blood work) drawn today and your tests are completely normal, you will receive your results only by: Marland Kitchen MyChart Message (if you have MyChart) OR . A paper copy in the mail If you have any lab test that is abnormal or we need to change your treatment, we will call you to review the results.  Testing/Procedures: None  Follow-Up: At [facility], you and your health needs are our priority.  As part of our continuing mission to provide you with exceptional heart care, we have created designated Provider Care Teams.  These Care Teams include your primary Cardiologist (physician) and Advanced Practice Providers (APPs -  Physician Assistants and Nurse Practitioners) who all work together to provide you with the care you need, when you need it. You will need a follow up appointment in 1 months.  Please call our office 2 months in advance to schedule this appointment.  You may see Ida Rogue, MD or one of the following Advanced Practice Providers on your designated Care Team:   Murray Hodgkins, NP Christell Faith, PA-C . Marrianne Mood, PA-C  Any Other Special Instructions Will Be Listed Below (If Applicable).

## 2019-05-02 NOTE — Progress Notes (Addendum)
Cardiology Clinic Note   Patient Name: Jahlani Lorentz Carlini Date of Encounter: 05/02/2019  Primary Care Provider:  Kirk Ruths, MD Primary Cardiologist:  Ida Rogue, MD  Patient Profile    Brandon Henderson 63 year old male presents today for follow-up status post PCI and drug-eluting stent to his RCA.  Past Medical History    Past Medical History:  Diagnosis Date  . Arthritis   . Carotid arterial disease ([place])    a. 2011 s/p R CEA.  . Coronary artery disease    a. 2000 s/p PCI/BMS x 2 of OM2 [name], [place]); b. 12/2018 MV: EF 43% (likely 2/2 GI uptake artifact). No ischemia. Low risk; c. 03/2019 PCI: LM nl, LAD 71m, LCX nl, OM2 90 ISR, RCA 40p, 23m (3.5x15 Resolute Onyx DES), RPL2 70.  . Depression   . Diabetes mellitus without complication ([place])   . History of tobacco abuse   . Hyperlipidemia LDL goal <70   . Hypertension   . Morbid obesity ([place])   . Osteoarthritis   . Sleep apnea    Past Surgical History:  Procedure Laterality Date  . artoscopic rotator cuff repair    . BACK SURGERY  1991  . CARDIAC CATHETERIZATION    . CAROTID ARTERY ANGIOPLASTY  2011  . carotid endartarectomy    . COLONOSCOPY WITH PROPOFOL N/A 06/12/2018   Procedure: COLONOSCOPY WITH PROPOFOL;  Surgeon: Toledo, Benay Pike, MD;  Location: [facility] ENDOSCOPY;  Service: Gastroenterology;  Laterality: N/A;  . CORONARY STENT INTERVENTION N/A 04/23/2019   Procedure: CORONARY STENT INTERVENTION;  Surgeon: Nelva Bush, MD;  Location: [place] [facility];  Service: Cardiovascular;  Laterality: N/A;  . ESOPHAGOGASTRODUODENOSCOPY (EGD) WITH PROPOFOL N/A 06/12/2018   Procedure: ESOPHAGOGASTRODUODENOSCOPY (EGD) WITH PROPOFOL;  Surgeon: Toledo, Benay Pike, MD;  Location: [facility] ENDOSCOPY;  Service: Gastroenterology;  Laterality: N/A;  . LEFT HEART CATH AND CORONARY ANGIOGRAPHY N/A 04/23/2019   Procedure: LEFT HEART CATH AND CORONARY ANGIOGRAPHY;  Surgeon: Nelva Bush, MD;  Location: [place] [facility];   Service: Cardiovascular;  Laterality: N/A;  . Rotator cuff  2010    Allergies  Allergies  Allergen Reactions  . Contrast Media [Iodinated Diagnostic Agents] Rash    History of Present Illness    Mr. Cedillos is a 63 year old male who presents to the clinic today for follow-up status post PCI with drug-eluting stent to his RCA, medical management of OM 2.  EF 55 to 60%.  (04/23/2019) his coronary artery disease dates back to 2000 with successful PCI/BMS x2 of the OM 2 ([place]).    Starting in the fall 2019 he has been experiencing intermittent exertional chest pressure as well as dyspnea that was lasting about a minute, and resolving with rest.  In March 2020 he underwent stress testing that showed no evidence of ischemia.  His EF at that time was 43% but, it was believed to be secondary to GI upset artifact.  He continued to have intermittent exertional chest tightness that he noted with physical activity and he indicated that he was having discomfort in his arms/jaw.  He followed up with his PCP who ordered CT angiography of the chest which was negative for PE and he was referred back for further evaluation.  His other PMH includes hypertension, hyperlipidemia, carotid arterial disease status post carotid endarterectomy, obesity, sleep apnea, diabetes, remote tach tobacco abuse, depression, and arthritis.  He presents to clinic today and states he is still noticing slight chest pressure.  He has been keeping a  daily chest pressure/BP log that he brings to the clinic today.  He states that he notices this pressure daily, it may subside throughout the day however, he does not notice an increase in the pressure with physical activity.  He took nitroglycerin x2 on 04/26/2019 but states that it did not relieve the pressure.  On 04/29/2019 his Imdur was increased to 30 mg daily and he states that he has not noticed much relief from the increased dose either.  Since leaving the hospital post  catheterization he has not returned to his daily activities.  He states on a typical day he works 10 hours a day and is walking most of the time.  He also states he has not gone back to maintaining his property which includes mowing and weed eating because he was concerned about the chest pressure he feels.  He goes on to state that he has a hiatal hernia that needs to be repaired and takes Protonix for GERD described symptoms.  He denies chest pain, increased shortness of breath, palpitations, presyncope, syncope, melena, hemoptysis, hematuria, shortness of breath, orthopnea, and PND.  Home Medications    Prior to Admission medications   Medication Sig Start Date End Date Taking? Authorizing Provider  Ascorbic Acid (VITAMIN C) 1000 MG tablet Take 1,000 mg by mouth at bedtime.     [provider]  aspirin EC 81 MG tablet Take 81 mg by mouth at bedtime.     [provider]  atorvastatin (LIPITOR) 40 MG tablet TAKE 1 TABLET BY MOUTH ONCE A DAY Patient taking differently: Take 40 mg by mouth at bedtime.  10/31/18   Minna Merritts, MD  clopidogrel (PLAVIX) 75 MG tablet Take 1 tablet (75 mg total) by mouth daily with breakfast. 04/25/19   End, Harrell Gave, MD  Coenzyme Q10 (COQ10) 200 MG CAPS Take 200 mg by mouth at bedtime.    [provider]  escitalopram (LEXAPRO) 10 MG tablet Take 10 mg by mouth at bedtime.     [provider]  furosemide (LASIX) 40 MG tablet Take 1 tablet (40 mg total) by mouth daily. 04/25/19   End, Harrell Gave, MD  Glucosamine-Chondroitin (COSAMIN DS PO) Take 1 tablet by mouth at bedtime.    [provider]  isosorbide mononitrate (IMDUR) 30 MG 24 hr tablet Take 0.5 tablets (15 mg total) by mouth daily. Patient not taking: Reported on 04/23/2019 04/12/19 07/11/19  Theora Gianotti, NP  magnesium oxide (MAG-OX) 400 MG tablet Take 400 mg by mouth at bedtime.    [provider]  Multiple Vitamin (MULTIVITAMIN WITH MINERALS)  TABS tablet Take 1 tablet by mouth at bedtime.    [provider]  naproxen sodium (ALEVE) 220 MG tablet Take 440 mg by mouth daily as needed (on work days ONLY).    [provider]  nitroGLYCERIN (NITROSTAT) 0.4 MG SL tablet Place 1 tablet (0.4 mg total) under the tongue every 5 (five) minutes as needed for chest pain. Patient not taking: Reported on 04/23/2019 04/12/19 07/11/19  Theora Gianotti, NP  pantoprazole (PROTONIX) 40 MG tablet Take 40 mg by mouth at bedtime.     [provider]  ramipril (ALTACE) 5 MG capsule TAKE 1 CAPSULE BY MOUTH ONCE DAILY Patient taking differently: Take 5 mg by mouth at bedtime.  10/31/18   Minna Merritts, MD    Family History    Family History  Problem Relation Age of Onset  . Heart attack Mother   . Stroke  Father   . Colon cancer Sister    He indicated that his mother is deceased. He indicated that his father is deceased. He indicated that his sister is alive.  Social History    Social History   Socioeconomic History  . Marital status: Married    Spouse name: Not on file  . Number of children: Not on file  . Years of education: Not on file  . Highest education level: Not on file  Occupational History  . Not on file  Social Needs  . Financial resource strain: Not on file  . Food insecurity    Worry: Not on file    Inability: Not on file  . Transportation needs    Medical: Not on file    Non-medical: Not on file  Tobacco Use  . Smoking status: Former Research scientist (life sciences)  . Smokeless tobacco: Never Used  . Tobacco comment: quit vaping 10/2018.  Substance and Sexual Activity  . Alcohol use: Yes  . Drug use: Never  . Sexual activity: Not on file  Lifestyle  . Physical activity    Days per week: Not on file    Minutes per session: Not on file  . Stress: Not on file  Relationships  . Social Herbalist on phone: Not on file    Gets together: Not on file    Attends religious service: Not on file     Active member of club or organization: Not on file    Attends meetings of clubs or organizations: Not on file    Relationship status: Not on file  . Intimate partner violence    Fear of current or ex partner: Not on file    Emotionally abused: Not on file    Physically abused: Not on file    Forced sexual activity: Not on file  Other Topics Concern  . Not on file  Social History Narrative   Lives locally.  Works @ [employer].  Does not routinely exercise.     Review of Systems    General:  No chills, fever, night sweats or weight changes.  Cardiovascular:  No chest pain, dyspnea on exertion, edema, orthopnea, palpitations, paroxysmal nocturnal dyspnea. Dermatological: No rash, lesions/masses Respiratory: No cough, dyspnea Urologic: No hematuria, dysuria Abdominal:   No nausea, vomiting, diarrhea, bright red blood per rectum, melena, or hematemesis Neurologic:  No visual changes, wkns, changes in mental status. All other systems reviewed and are otherwise negative except as noted above.  Physical Exam    VS:  BP 110/62 (BP Location: Left Arm, Patient Position: Sitting, Cuff Size: Normal)   Pulse 87   Ht 5\' 7"  (1.702 m)   Wt 257 lb 4 oz (116.7 kg)   BMI 40.29 kg/m  , BMI Body mass index is 40.29 kg/m. GEN: Well nourished, well developed, in no acute distress. HEENT: normal. Neck: Supple, no JVD, carotid bruits, or masses. Cardiac: RRR, no murmurs, rubs, or gallops. No clubbing, cyanosis, edema.  Radials/DP/PT 2+ and equal bilaterally.  Respiratory:  Respirations regular and unlabored, clear to auscultation bilaterally. GI: Soft, nontender, nondistended, BS + x 4. MS: no deformity or atrophy. Skin: warm and dry, no rash.+ Right groin contusion from groin PCI access, clean and intact Neuro:  Strength and sensation are intact. Psych: Normal affect.  Accessory Clinical Findings    ECG personally reviewed by me today-normal sinus rhythm 81 bpm- No acute changes  EKG 04/24/2019:  Normal sinus rhythm 67 bpm EKG 04/23/2019: Sinus atrial rhythm  with ST elevation in inferior leads 63 bpm  Cardiac catheterization 04/23/2019: Conclusions: 1. Significant two-vessel coronary artery disease, including long 90% OM2 stenosis (suspect ISR) and 80-90% mid RCA lesion. 2. Moderately elevated left ventricular filling pressure (LVEDP 25-30 mmHg). 3. Normal left ventricular systolic function. 4. Mild LVOT/AoV gradient (15 mmHg peak-to-peak gradient). 5. Successful PCI to mid RCA using Resolute Onyx 3.5 x 15 mm drug-eluting stent with 0% residual stenosis and TIMI-3 flow. 6. Small right radial artery not suitable for catheterization. 7. Small right groin hematoma following MynxGrip deployment, successfully controlled with manual compression.  Assessment & Plan   1.  Chest pain/coronary artery disease- PCI/LHC, DES x1 mid RCA, medical management for OM 2, no chest pain today but states he has frequent bouts of daily chest pressure, believed to be GI in nature. Continue 81 mg aspirin daily Continue clopidogrel 75 mg tablet daily Continue isosorbide mononitrate 30 mg daily- started 04/29/2019, maintain dose for now, will consider increase depending on symptoms.  Patient states he has experienced slight headache on this medication. Continue nitroglycerin sublingual tablet 0.4 mg as needed Heart healthy low-sodium diet Increase physical activity as tolerated  2.  Essential hypertension- well-controlled Continue ramipril 5 mg tablet at bedtime  3.  Hyperlipidemia-01/01/2019: Cholesterol, Total 151; HDL 44; LDL Calculated 76; Triglycerides 154 Continue atorvastatin 40 mg tablet daily   4.  Type 2 diabetes mellitus-A1c 6.2 Heart healthy low-sodium diet/carb modified Increase physical activity as tolerated-goal 150 minutes/week Monitored by PCP  5.  Hiatal hernia/GERD- intermittent reflux, frequent daily bouts of chest pressure. Continue pantoprazole 40 mg tablet at bedtime Heart  healthy low-sodium carb/ modified diet Increase physical activity as tolerated Monitored by PCP  Return to work note provided 05/07/2019.  Disposition: Follow-up with Dr. Rockey Situ in 1 month   Deberah Pelton, NP 05/02/2019, 10:24 AM

## 2019-05-06 DIAGNOSIS — M1711 Unilateral primary osteoarthritis, right knee: Secondary | ICD-10-CM | POA: Diagnosis not present

## 2019-05-10 ENCOUNTER — Encounter: Payer: BC Managed Care – PPO | Attending: Internal Medicine | Admitting: *Deleted

## 2019-05-10 ENCOUNTER — Other Ambulatory Visit: Payer: Self-pay

## 2019-05-10 DIAGNOSIS — I2510 Atherosclerotic heart disease of native coronary artery without angina pectoris: Secondary | ICD-10-CM | POA: Insufficient documentation

## 2019-05-10 DIAGNOSIS — Z6841 Body Mass Index (BMI) 40.0 and over, adult: Secondary | ICD-10-CM | POA: Insufficient documentation

## 2019-05-10 DIAGNOSIS — M1990 Unspecified osteoarthritis, unspecified site: Secondary | ICD-10-CM | POA: Insufficient documentation

## 2019-05-10 DIAGNOSIS — Z7982 Long term (current) use of aspirin: Secondary | ICD-10-CM | POA: Insufficient documentation

## 2019-05-10 DIAGNOSIS — Z79899 Other long term (current) drug therapy: Secondary | ICD-10-CM | POA: Insufficient documentation

## 2019-05-10 DIAGNOSIS — E785 Hyperlipidemia, unspecified: Secondary | ICD-10-CM | POA: Insufficient documentation

## 2019-05-10 DIAGNOSIS — G4730 Sleep apnea, unspecified: Secondary | ICD-10-CM | POA: Insufficient documentation

## 2019-05-10 DIAGNOSIS — Z7902 Long term (current) use of antithrombotics/antiplatelets: Secondary | ICD-10-CM | POA: Insufficient documentation

## 2019-05-10 DIAGNOSIS — E119 Type 2 diabetes mellitus without complications: Secondary | ICD-10-CM | POA: Insufficient documentation

## 2019-05-10 DIAGNOSIS — F329 Major depressive disorder, single episode, unspecified: Secondary | ICD-10-CM | POA: Insufficient documentation

## 2019-05-10 DIAGNOSIS — I10 Essential (primary) hypertension: Secondary | ICD-10-CM | POA: Insufficient documentation

## 2019-05-10 DIAGNOSIS — Z955 Presence of coronary angioplasty implant and graft: Secondary | ICD-10-CM | POA: Insufficient documentation

## 2019-05-10 NOTE — Progress Notes (Signed)
Virtual orientation completed. Diagnosis can be found in [facility] 7/7. EP and RD orientation scheduled for 7/28 8am

## 2019-05-13 ENCOUNTER — Encounter: Payer: Self-pay | Admitting: *Deleted

## 2019-05-14 ENCOUNTER — Other Ambulatory Visit: Payer: Self-pay

## 2019-05-14 ENCOUNTER — Encounter: Payer: BC Managed Care – PPO | Admitting: *Deleted

## 2019-05-14 VITALS — Ht 66.5 in | Wt 253.3 lb

## 2019-05-14 DIAGNOSIS — G4730 Sleep apnea, unspecified: Secondary | ICD-10-CM | POA: Diagnosis not present

## 2019-05-14 DIAGNOSIS — M1990 Unspecified osteoarthritis, unspecified site: Secondary | ICD-10-CM | POA: Diagnosis not present

## 2019-05-14 DIAGNOSIS — Z7982 Long term (current) use of aspirin: Secondary | ICD-10-CM | POA: Diagnosis not present

## 2019-05-14 DIAGNOSIS — Z79899 Other long term (current) drug therapy: Secondary | ICD-10-CM | POA: Diagnosis not present

## 2019-05-14 DIAGNOSIS — F329 Major depressive disorder, single episode, unspecified: Secondary | ICD-10-CM | POA: Diagnosis not present

## 2019-05-14 DIAGNOSIS — Z6841 Body Mass Index (BMI) 40.0 and over, adult: Secondary | ICD-10-CM | POA: Diagnosis not present

## 2019-05-14 DIAGNOSIS — I2510 Atherosclerotic heart disease of native coronary artery without angina pectoris: Secondary | ICD-10-CM | POA: Diagnosis not present

## 2019-05-14 DIAGNOSIS — I10 Essential (primary) hypertension: Secondary | ICD-10-CM | POA: Diagnosis not present

## 2019-05-14 DIAGNOSIS — Z955 Presence of coronary angioplasty implant and graft: Secondary | ICD-10-CM

## 2019-05-14 DIAGNOSIS — E785 Hyperlipidemia, unspecified: Secondary | ICD-10-CM | POA: Diagnosis not present

## 2019-05-14 DIAGNOSIS — Z7902 Long term (current) use of antithrombotics/antiplatelets: Secondary | ICD-10-CM | POA: Diagnosis not present

## 2019-05-14 DIAGNOSIS — E119 Type 2 diabetes mellitus without complications: Secondary | ICD-10-CM | POA: Diagnosis not present

## 2019-05-14 DIAGNOSIS — M1711 Unilateral primary osteoarthritis, right knee: Secondary | ICD-10-CM | POA: Diagnosis not present

## 2019-05-14 NOTE — Progress Notes (Signed)
Cardiac Individual Treatment Plan  Patient Details  Name: Brandon Henderson MRN: [id number] Date of Birth: 1956-03-31 Referring Provider:     Cardiac Rehab from 05/14/2019 in [facility] Cardiac and Pulmonary Rehab  Referring Provider  End, Harrell Gave MD [facility] Cardiologist: Dr. Ida Rogue      Initial Encounter Date:    Cardiac Rehab from 05/14/2019 in [facility] Cardiac and Pulmonary Rehab  Date  05/14/19      Visit Diagnosis: Status post coronary artery stent placement -   Patient's Home Medications on Admission:  Current Outpatient Medications:  .  Ascorbic Acid (VITAMIN C) 1000 MG tablet, Take 1,000 mg by mouth at bedtime. , Disp: , Rfl:  .  aspirin EC 81 MG tablet, Take 81 mg by mouth at bedtime. , Disp: , Rfl:  .  atorvastatin (LIPITOR) 40 MG tablet, TAKE 1 TABLET BY MOUTH ONCE A DAY (Patient taking differently: Take 40 mg by mouth at bedtime. ), Disp: 90 tablet, Rfl: 3 .  clopidogrel (PLAVIX) 75 MG tablet, Take 1 tablet (75 mg total) by mouth daily with breakfast., Disp: 90 tablet, Rfl: 3 .  Coenzyme Q10 (COQ10) 200 MG CAPS, Take 200 mg by mouth at bedtime., Disp: , Rfl:  .  escitalopram (LEXAPRO) 10 MG tablet, Take 10 mg by mouth at bedtime. , Disp: , Rfl:  .  furosemide (LASIX) 40 MG tablet, Take 1 tablet (40 mg total) by mouth daily., Disp: 30 tablet, Rfl: 3 .  Glucosamine-Chondroitin (COSAMIN DS PO), Take 1 tablet by mouth at bedtime., Disp: , Rfl:  .  isosorbide mononitrate (IMDUR) 30 MG 24 hr tablet, Take 1 tablet (30 mg total) by mouth daily., Disp: 90 tablet, Rfl: 3 .  magnesium oxide (MAG-OX) 400 MG tablet, Take 400 mg by mouth at bedtime., Disp: , Rfl:  .  Multiple Vitamin (MULTIVITAMIN WITH MINERALS) TABS tablet, Take 1 tablet by mouth at bedtime., Disp: , Rfl:  .  naproxen sodium (ALEVE) 220 MG tablet, Take 440 mg by mouth daily as needed (on work days ONLY)., Disp: , Rfl:  .  nitroGLYCERIN (NITROSTAT) 0.4 MG SL tablet, Place 1 tablet (0.4 mg total) under the tongue  every 5 (five) minutes as needed for chest pain., Disp: 90 tablet, Rfl: 3 .  pantoprazole (PROTONIX) 40 MG tablet, Take 40 mg by mouth at bedtime. , Disp: , Rfl:  .  ramipril (ALTACE) 5 MG capsule, TAKE 1 CAPSULE BY MOUTH ONCE DAILY (Patient taking differently: Take 5 mg by mouth at bedtime. ), Disp: 90 capsule, Rfl: 3  Past Medical History: Past Medical History:  Diagnosis Date  . Arthritis   . Carotid arterial disease ([place])    a. 2011 s/p R CEA.  . Coronary artery disease    a. 2000 s/p PCI/BMS x 2 of OM2 [name], [place]); b. 12/2018 MV: EF 43% (likely 2/2 GI uptake artifact). No ischemia. Low risk; c. 03/2019 PCI: LM nl, LAD 67m LCX nl, OM2 90 ISR, RCA 40p, 838m3.5x15 Resolute Onyx DES), RPL2 70.  . Depression   . Diabetes mellitus [place] complication [place]  . History of tobacco abuse   . Hyperlipidemia LDL goal <70   . Hypertension   . Morbid obesity ([place]  . Osteoarthritis   . Sleep apnea     Tobacco Use: Social History   Tobacco Use  Smoking Status Former Smoker  Smokeless Tobacco Never Used  Tobacco Comment   quit vaping 10/2018.    Labs: Recent Review Flowsheet Data  Labs for ITP Cardiac and Pulmonary Rehab Latest Ref Rng & Units 01/01/2019   Cholestrol 100 - 199 mg/dL 151   LDLCALC 0 - 99 mg/dL 76   HDL >39 mg/dL 44   Trlycerides 0 - 149 mg/dL 154(H)   Hemoglobin A1c 4.8 - 5.6 % 6.2(H)       Exercise Target Goals: Exercise Program Goal: Individual exercise prescription set using results from initial 6 min walk test and THRR while considering  patient's activity barriers and safety.   Exercise Prescription Goal: Initial exercise prescription builds to 30-45 minutes a day of aerobic activity, 2-3 days per week.  Home exercise guidelines will be given to patient during program as part of exercise prescription that the participant will acknowledge.  Activity Barriers & Risk Stratification: Activity Barriers & Cardiac Risk Stratification - 05/14/19 0905       Activity Barriers & Cardiac Risk Stratification   Activity Barriers  Back Problems;Joint Problems;Neck/Spine Problems;Deconditioning;Muscular Weakness;Shortness of Breath;Chest Pain/Angina    Cardiac Risk Stratification  Moderate       6 Minute Walk: 6 Minute Walk    Row Name 05/14/19 0903         6 Minute Walk   Phase  Initial     Distance  1430 feet     Walk Time  6 minutes     # of Rest Breaks  0     MPH  2.71     METS  3.16     RPE  15     Perceived Dyspnea   3     VO2 Peak  11.07     Symptoms  Yes (comment)     Comments  chest pain 5/10 relieved with rest, SOB, R knee pain 4/10     Resting HR  68 bpm     Resting BP  126/70     Resting Oxygen Saturation   98 %     Exercise Oxygen Saturation  during 6 min walk  96 %     Max Ex. HR  113 bpm     Max Ex. BP  176/82     2 Minute Post BP  142/70        Oxygen Initial Assessment:   Oxygen Re-Evaluation:   Oxygen Discharge (Final Oxygen Re-Evaluation):   Initial Exercise Prescription: Initial Exercise Prescription - 05/14/19 0900      Date of Initial Exercise RX and Referring Provider   Date  05/14/19    Referring Provider  End, Harrell Gave MD   Primary Cardiologist: Dr. Ida Rogue     Treadmill   MPH  2.5    Grade  0.5    Minutes  15    METs  3.09      NuStep   Level  2    SPM  80    Minutes  15    METs  3      Recumbant Elliptical   Level  1    RPM  50    Minutes  15    METs  3      T5 Nustep   Level  2    SPM  80    Minutes  15    METs  3      Prescription Details   Frequency (times per week)  3    Duration  Progress to 30 minutes of continuous aerobic without signs/symptoms of physical distress      Intensity   THRR 40-80% of Max  Heartrate  103-138    Ratings of Perceived Exertion  11-13    Perceived Dyspnea  0-4      Progression   Progression  Continue to progress workloads to maintain intensity without signs/symptoms of physical distress.      Resistance Training   Training  Prescription  Yes    Weight  3 lbs    Reps  10-15       Perform Capillary Blood Glucose checks as needed.  Exercise Prescription Changes:  Exercise Prescription Changes    Row Name 05/14/19 0900             Response to Exercise   Blood Pressure (Admit)  126/70       Blood Pressure (Exercise)  176/82       Blood Pressure (Exit)  142/70       Heart Rate (Admit)  68 bpm       Heart Rate (Exercise)  113 bpm       Heart Rate (Exit)  79 bpm       Oxygen Saturation (Admit)  98 %       Oxygen Saturation (Exercise)  96 %       Rating of Perceived Exertion (Exercise)  15       Perceived Dyspnea (Exercise)  3       Symptoms  chest pain 5/10 (relieved with rest), SOB, r knee pain 4/10       Comments  walk test results          Exercise Comments:   Exercise Goals and Review:  Exercise Goals    Row Name 05/14/19 0909             Exercise Goals   Increase Physical Activity  Yes       Intervention  Provide advice, education, support and counseling about physical activity/exercise needs.;Develop an individualized exercise prescription for aerobic and resistive training based on initial evaluation findings, risk stratification, comorbidities and participant's personal goals.       Expected Outcomes  Short Term: Attend rehab on a regular basis to increase amount of physical activity.;Long Term: Add in home exercise to make exercise part of routine and to increase amount of physical activity.;Long Term: Exercising regularly at least 3-5 days a week.       Increase Strength and Stamina  Yes       Intervention  Provide advice, education, support and counseling about physical activity/exercise needs.;Develop an individualized exercise prescription for aerobic and resistive training based on initial evaluation findings, risk stratification, comorbidities and participant's personal goals.       Expected Outcomes  Short Term: Increase workloads from initial exercise prescription for  resistance, speed, and METs.;Short Term: Perform resistance training exercises routinely during rehab and add in resistance training at home;Long Term: Improve cardiorespiratory fitness, muscular endurance and strength as measured by increased METs and functional capacity (6MWT)       Able to understand and use rate of perceived exertion (RPE) scale  Yes       Intervention  Provide education and explanation on how to use RPE scale       Expected Outcomes  Short Term: Able to use RPE daily in rehab to express subjective intensity level;Long Term:  Able to use RPE to guide intensity level when exercising independently       Able to understand and use Dyspnea scale  Yes       Intervention  Provide education and explanation on how to  use Dyspnea scale       Expected Outcomes  Short Term: Able to use Dyspnea scale daily in rehab to express subjective sense of shortness of breath during exertion;Long Term: Able to use Dyspnea scale to guide intensity level when exercising independently       Knowledge and understanding of Target Heart Rate Range (THRR)  Yes       Intervention  Provide education and explanation of THRR including how the numbers were predicted and where they are located for reference       Expected Outcomes  Short Term: Able to use daily as guideline for intensity in rehab;Long Term: Able to use THRR to govern intensity when exercising independently;Short Term: Able to state/look up THRR       Able to check pulse independently  Yes       Intervention  Provide education and demonstration on how to check pulse in carotid and radial arteries.;Review the importance of being able to check your own pulse for safety during independent exercise       Expected Outcomes  Short Term: Able to explain why pulse checking is important during independent exercise;Long Term: Able to check pulse independently and accurately       Understanding of Exercise Prescription  Yes       Intervention  Provide education,  explanation, and written materials on patient's individual exercise prescription       Expected Outcomes  Short Term: Able to explain program exercise prescription;Long Term: Able to explain home exercise prescription to exercise independently          Exercise Goals Re-Evaluation :   Discharge Exercise Prescription (Final Exercise Prescription Changes): Exercise Prescription Changes - 05/14/19 0900      Response to Exercise   Blood Pressure (Admit)  126/70    Blood Pressure (Exercise)  176/82    Blood Pressure (Exit)  142/70    Heart Rate (Admit)  68 bpm    Heart Rate (Exercise)  113 bpm    Heart Rate (Exit)  79 bpm    Oxygen Saturation (Admit)  98 %    Oxygen Saturation (Exercise)  96 %    Rating of Perceived Exertion (Exercise)  15    Perceived Dyspnea (Exercise)  3    Symptoms  chest pain 5/10 (relieved with rest), SOB, r knee pain 4/10    Comments  walk test results       Nutrition:  Target Goals: Understanding of nutrition guidelines, daily intake of sodium <1573m, cholesterol <2069m calories 30% from fat and 7% or less from saturated fats, daily to have 5 or more servings of fruits and vegetables.  Biometrics: Pre Biometrics - 05/14/19 0910      Pre Biometrics   Height  5' 6.5" (1.689 m)    Weight  253 lb 4.8 oz (114.9 kg)    BMI (Calculated)  40.28    Single Leg Stand  30 seconds        Nutrition Therapy Plan and Nutrition Goals: Nutrition Therapy & Goals - 05/14/19 0918      Nutrition Therapy   Diet  Low Na HH diet    Drug/Food Interactions  Statins/Certain Fruits   lipitor   Protein (specify units)  95g    Fiber  30 grams    Whole Grain Foods  3 servings    Saturated Fats  12 max. grams    Fruits and Vegetables  5 servings/day    Sodium  1.5 grams  Personal Nutrition Goals   Nutrition Goal  ST: reduce Na intake to 1500-2063m range LT: increase functional years and extend working years    Comments  Pt reports works at [employer]and 5am starting.  If 330am will drink veggie juice, if 5am will drink coffee. Pt reports regardless will have B at 7am (instant oatmeal, fruit, small amount of brown sugar). L is multigrain bread with t[place]cold cuts and some vegetable or fruit. D baked/grilled/sauteed ("healthy oil" his wife uses) fish or meat with wedge salad (cheese, olives, balsalmic, iceburg) or other veggies (roasted with the same oil). Pt will have yasso (frozen yogurt pop) for snack and beer (lime salt and lime wedge) or wine (~4x/week). Pt reports knowing what to do but getting back into it. Will go out to eat once per month but will have associated guilt afterwards. Pt once did diet (if high fat, low CHO - if high CHO, low fat) wants to try to move towards that. Pt will use spice mix with salt, lime/lemon, and pepper instead of conventional salt. Pt will sometimes use NoSalt, but doesn't care for it on certain things. RD noted A1c 6.2, fasting BG no higher than 121 (these are prediabetic numbers), inquired with pt because diagnosis of diabetes in chart, pt reports not having diabetes, he was told he had prediabetes. Pt reports going to sleep at 630-7pm.      Intervention Plan   Intervention  Prescribe, educate and counsel regarding individualized specific dietary modifications aiming towards targeted core components such as weight, hypertension, lipid management, diabetes, heart failure and other comorbidities.;Nutrition handout(s) given to patient.    Expected Outcomes  Short Term Goal: Understand basic principles of dietary content, such as calories, fat, sodium, cholesterol and nutrients.;Short Term Goal: A plan has been developed with personal nutrition goals set during dietitian appointment.;Long Term Goal: Adherence to prescribed nutrition plan.       Nutrition Assessments: Nutrition Assessments - 05/13/19 0754      MEDFICTS Scores   Pre Score  18       Nutrition Goals Re-Evaluation:   Nutrition Goals Discharge (Final Nutrition  Goals Re-Evaluation):   Psychosocial: Target Goals: Acknowledge presence or absence of significant depression and/or stress, maximize coping skills, provide positive support system. Participant is able to verbalize types and ability to use techniques and skills needed for reducing stress and depression.   Initial Review & Psychosocial Screening: Initial Psych Review & Screening - 05/10/19 0954      Initial Review   Current issues with  Current Stress Concerns    Comments  RIsmailreports doing well now that he is back to work post Stent. He and his wife have been doing well during the pandemic. He said he feels a less stressed now that he can technically retire any time from his job at the [employer]  Yes   wife     Barriers   Psychosocial barriers to participate in program  There are no identifiable barriers or psychosocial needs.      Screening Interventions   Interventions  Encouraged to exercise    Expected Outcomes  Short Term goal: Utilizing psychosocial counselor, staff and physician to assist with identification of specific Stressors or current issues interfering with healing process. Setting desired goal for each stressor or current issue identified.;Long Term Goal: Stressors or current issues are controlled or eliminated.;Short Term goal: Identification and review with participant of  any Quality of Life or Depression concerns found by scoring the questionnaire.;Long Term goal: The participant improves quality of Life and PHQ9 Scores as seen by post scores and/or verbalization of changes       Quality of Life Scores:  Quality of Life - 05/13/19 0753      Quality of Life   Select  Quality of Life      Quality of Life Scores   Health/Function Pre  15.93 %    Socioeconomic Pre  16.25 %    Psych/Spiritual Pre  21.21 %    Family Pre  25.9 %    GLOBAL Pre  18.48 %      Scores of 19 and below usually indicate a  poorer quality of life in these areas.  A difference of  2-3 points is a clinically meaningful difference.  A difference of 2-3 points in the total score of the Quality of Life Index has been associated with significant improvement in overall quality of life, self-image, physical symptoms, and general health in studies assessing change in quality of life.  PHQ-9: Recent Review Flowsheet Data    Depression screen [facility] 2/9 05/14/2019   Decreased Interest 1   Down, Depressed, Hopeless 0   PHQ - 2 Score 1   Altered sleeping 0   Tired, decreased energy 2   Change in appetite 0   Feeling bad or failure about yourself  0   Trouble concentrating 0   Moving slowly or fidgety/restless 0   Suicidal thoughts 0   PHQ-9 Score 3   Difficult doing work/chores Not difficult at all     Interpretation of Total Score  Total Score Depression Severity:  1-4 = Minimal depression, 5-9 = Mild depression, 10-14 = Moderate depression, 15-19 = Moderately severe depression, 20-27 = Severe depression   Psychosocial Evaluation and Intervention:   Psychosocial Re-Evaluation:   Psychosocial Discharge (Final Psychosocial Re-Evaluation):   Vocational Rehabilitation: Provide vocational rehab assistance to qualifying candidates.   Vocational Rehab Evaluation & Intervention: Vocational Rehab - 05/10/19 0953      Initial Vocational Rehab Evaluation & Intervention   Assessment shows need for Vocational Rehabilitation  No       Education: Education Goals: Education classes will be provided on a variety of topics geared toward better understanding of heart health and risk factor modification. Participant will state understanding/return demonstration of topics presented as noted by education test scores.  Learning Barriers/Preferences: Learning Barriers/Preferences - 05/10/19 0953      Learning Barriers/Preferences   Learning Barriers  None    Learning Preferences  None       Education  Topics:  AED/CPR: - Group verbal and written instruction with the use of models to demonstrate the basic use of the AED with the basic ABC's of resuscitation.   General Nutrition Guidelines/Fats and Fiber: -Group instruction provided by verbal, written material, models and posters to present the general guidelines for heart healthy nutrition. Gives an explanation and review of dietary fats and fiber.   Controlling Sodium/Reading Food Labels: -Group verbal and written material supporting the discussion of sodium use in heart healthy nutrition. Review and explanation with models, verbal and written materials for utilization of the food label.   Exercise Physiology & General Exercise Guidelines: - Group verbal and written instruction with models to review the exercise physiology of the cardiovascular system and associated critical values. Provides general exercise guidelines with specific guidelines to those with heart or lung disease.    Aerobic Exercise &  Resistance Training: - Gives group verbal and written instruction on the various components of exercise. Focuses on aerobic and resistive training programs and the benefits of this training and how to safely progress through these programs..   Flexibility, Balance, Mind/Body Relaxation: Provides group verbal/written instruction on the benefits of flexibility and balance training, including mind/body exercise modes such as yoga, pilates and tai chi.  Demonstration and skill practice provided.   Stress and Anxiety: - Provides group verbal and written instruction about the health risks of elevated stress and causes of high stress.  Discuss the correlation between heart/lung disease and anxiety and treatment options. Review healthy ways to manage with stress and anxiety.   Depression: - Provides group verbal and written instruction on the correlation between heart/lung disease and depressed mood, treatment options, and the stigmas  associated with seeking treatment.   Anatomy & Physiology of the Heart: - Group verbal and written instruction and models provide basic cardiac anatomy and physiology, with the coronary electrical and arterial systems. Review of Valvular disease and Heart Failure   Cardiac Procedures: - Group verbal and written instruction to review commonly prescribed medications for heart disease. Reviews the medication, class of the drug, and side effects. Includes the steps to properly store meds and maintain the prescription regimen. (beta blockers and nitrates)   Cardiac Medications I: - Group verbal and written instruction to review commonly prescribed medications for heart disease. Reviews the medication, class of the drug, and side effects. Includes the steps to properly store meds and maintain the prescription regimen.   Cardiac Medications II: -Group verbal and written instruction to review commonly prescribed medications for heart disease. Reviews the medication, class of the drug, and side effects. (all other drug classes)    Go Sex-Intimacy & Heart Disease, Get SMART - Goal Setting: - Group verbal and written instruction through game format to discuss heart disease and the return to sexual intimacy. Provides group verbal and written material to discuss and apply goal setting through the application of the S.M.A.R.T. Method.   Other Matters of the Heart: - Provides group verbal, written materials and models to describe Stable Angina and Peripheral Artery. Includes description of the disease process and treatment options available to the cardiac patient.   Exercise & Equipment Safety: - Individual verbal instruction and demonstration of equipment use and safety with use of the equipment.   Infection Prevention: - Provides verbal and written material to individual with discussion of infection control including proper hand washing and proper equipment cleaning during exercise  session.   Falls Prevention: - Provides verbal and written material to individual with discussion of falls prevention and safety.   Diabetes: - Individual verbal and written instruction to review signs/symptoms of diabetes, desired ranges of glucose level fasting, after meals and with exercise. Acknowledge that pre and post exercise glucose checks will be done for 3 sessions at entry of program.   Know Your Numbers and Risk Factors: -Group verbal and written instruction about important numbers in your health.  Discussion of what are risk factors and how they play a role in the disease process.  Review of Cholesterol, Blood Pressure, Diabetes, and BMI and the role they play in your overall health.   Sleep Hygiene: -Provides group verbal and written instruction about how sleep can affect your health.  Define sleep hygiene, discuss sleep cycles and impact of sleep habits. Review good sleep hygiene tips.    Other: -Provides group and verbal instruction on various topics (see comments)  Knowledge Questionnaire Score: Knowledge Questionnaire Score - 05/13/19 0754      Knowledge Questionnaire Score   Pre Score  26/26       Core Components/Risk Factors/Patient Goals at Admission: Personal Goals and Risk Factors at Admission - 05/14/19 0910      Core Components/Risk Factors/Patient Goals on Admission    Weight Management  Weight Loss;Yes;Obesity    Intervention  Weight Management: Develop a combined nutrition and exercise program designed to reach desired caloric intake, while maintaining appropriate intake of nutrient and fiber, sodium and fats, and appropriate energy expenditure required for the weight goal.;Weight Management: Provide education and appropriate resources to help participant work on and attain dietary goals.;Weight Management/Obesity: Establish reasonable short term and long term weight goals.;Obesity: Provide education and appropriate resources to help participant work  on and attain dietary goals.    Admit Weight  253 lb 4.8 oz (114.9 kg)    Goal Weight: Short Term  248 lb (112.5 kg)    Goal Weight: Long Term  240 lb (108.9 kg)    Expected Outcomes  Short Term: Continue to assess and modify interventions until short term weight is achieved;Long Term: Adherence to nutrition and physical activity/exercise program aimed toward attainment of established weight goal;Weight Loss: Understanding of general recommendations for a balanced deficit meal plan, which promotes 1-2 lb weight loss per week and includes a negative energy balance of [phone] kcal/d;Understanding recommendations for meals to include 15-35% energy as protein, 25-35% energy from fat, 35-60% energy from carbohydrates, less than 23m of dietary cholesterol, 20-35 gm of total fiber daily;Understanding of distribution of calorie intake throughout the day with the consumption of 4-5 meals/snacks    Hypertension  Yes    Intervention  Provide education on lifestyle modifcations including regular physical activity/exercise, weight management, moderate sodium restriction and increased consumption of fresh fruit, vegetables, and low fat dairy, alcohol moderation, and smoking cessation.;Monitor prescription use compliance.    Expected Outcomes  Short Term: Continued assessment and intervention until BP is < 140/936mHG in hypertensive participants. < 130/8077mG in hypertensive participants with diabetes, heart failure or chronic kidney disease.;Long Term: Maintenance of blood pressure at goal levels.    Lipids  Yes    Intervention  Provide education and support for participant on nutrition & aerobic/resistive exercise along with prescribed medications to achieve LDL <87m81mDL >40mg49m Expected Outcomes  Short Term: Participant states understanding of desired cholesterol values and is compliant with medications prescribed. Participant is following exercise prescription and nutrition guidelines.;Long Term: Cholesterol  controlled with medications as prescribed, with individualized exercise RX and with personalized nutrition plan. Value goals: LDL < 87mg,18m > 40 mg.       Core Components/Risk Factors/Patient Goals Review:    Core Components/Risk Factors/Patient Goals at Discharge (Final Review):    ITP Comments: ITP Comments    Row Name 05/10/19 0952 05/14/19 0900         ITP Comments  Virtual orientation completed. Diagnosis can be found in [facility] 7/7. EP and RD orientation scheduled for 7/28 8am  6MWT, gym orientation, and RD initial consult completed today.  Initial ITP created and sent to Dr. Mark MEmily Filbertcal Director.         Comments: Initial ITP

## 2019-05-14 NOTE — Patient Instructions (Signed)
Patient Instructions  Patient Details  Name: Brandon Henderson MRN: [id number] Date of Birth: 1955-11-17 Referring Provider:  Nelva Bush, MD  Below are your personal goals for exercise, nutrition, and risk factors. Our goal is to help you stay on track towards obtaining and maintaining these goals. We will be discussing your progress on these goals with you throughout the program.  Initial Exercise Prescription: Initial Exercise Prescription - 05/14/19 0900      Date of Initial Exercise RX and Referring Provider   Date  05/14/19    Referring Provider  End, Harrell Gave MD   Primary Cardiologist: Dr. Ida Rogue     Treadmill   MPH  2.5    Grade  0.5    Minutes  15    METs  3.09      NuStep   Level  2    SPM  80    Minutes  15    METs  3      Recumbant Elliptical   Level  1    RPM  50    Minutes  15    METs  3      T5 Nustep   Level  2    SPM  80    Minutes  15    METs  3      Prescription Details   Frequency (times per week)  3    Duration  Progress to 30 minutes of continuous aerobic without signs/symptoms of physical distress      Intensity   THRR 40-80% of Max Heartrate  103-138    Ratings of Perceived Exertion  11-13    Perceived Dyspnea  0-4      Progression   Progression  Continue to progress workloads to maintain intensity without signs/symptoms of physical distress.      Resistance Training   Training Prescription  Yes    Weight  3 lbs    Reps  10-15       Exercise Goals: Frequency: Be able to perform aerobic exercise two to three times per week in program working toward 2-5 days per week of home exercise.  Intensity: Work with a perceived exertion of 11 (fairly light) - 15 (hard) while following your exercise prescription.  We will make changes to your prescription with you as you progress through the program.   Duration: Be able to do 30 to 45 minutes of continuous aerobic exercise in addition to a 5 minute warm-up and a 5 minute  cool-down routine.   Nutrition Goals: Your personal nutrition goals will be established when you do your nutrition analysis with the dietician.  The following are general nutrition guidelines to follow: Cholesterol < 200mg /day Sodium < 1500mg /day Fiber: Men over 50 yrs - 30 grams per day  Personal Goals: Personal Goals and Risk Factors at Admission - 05/14/19 0910      Core Components/Risk Factors/Patient Goals on Admission    Weight Management  Weight Loss;Yes;Obesity    Intervention  Weight Management: Develop a combined nutrition and exercise program designed to reach desired caloric intake, while maintaining appropriate intake of nutrient and fiber, sodium and fats, and appropriate energy expenditure required for the weight goal.;Weight Management: Provide education and appropriate resources to help participant work on and attain dietary goals.;Weight Management/Obesity: Establish reasonable short term and long term weight goals.;Obesity: Provide education and appropriate resources to help participant work on and attain dietary goals.    Admit Weight  253 lb 4.8 oz (114.9 kg)  Goal Weight: Short Term  248 lb (112.5 kg)    Goal Weight: Long Term  240 lb (108.9 kg)    Expected Outcomes  Short Term: Continue to assess and modify interventions until short term weight is achieved;Long Term: Adherence to nutrition and physical activity/exercise program aimed toward attainment of established weight goal;Weight Loss: Understanding of general recommendations for a balanced deficit meal plan, which promotes 1-2 lb weight loss per week and includes a negative energy balance of [phone] kcal/d;Understanding recommendations for meals to include 15-35% energy as protein, 25-35% energy from fat, 35-60% energy from carbohydrates, less than 200mg  of dietary cholesterol, 20-35 gm of total fiber daily;Understanding of distribution of calorie intake throughout the day with the consumption of 4-5 meals/snacks     Hypertension  Yes    Intervention  Provide education on lifestyle modifcations including regular physical activity/exercise, weight management, moderate sodium restriction and increased consumption of fresh fruit, vegetables, and low fat dairy, alcohol moderation, and smoking cessation.;Monitor prescription use compliance.    Expected Outcomes  Short Term: Continued assessment and intervention until BP is < 140/73mm HG in hypertensive participants. < 130/68mm HG in hypertensive participants with diabetes, heart failure or chronic kidney disease.;Long Term: Maintenance of blood pressure at goal levels.    Lipids  Yes    Intervention  Provide education and support for participant on nutrition & aerobic/resistive exercise along with prescribed medications to achieve LDL 70mg , HDL >40mg .    Expected Outcomes  Short Term: Participant states understanding of desired cholesterol values and is compliant with medications prescribed. Participant is following exercise prescription and nutrition guidelines.;Long Term: Cholesterol controlled with medications as prescribed, with individualized exercise RX and with personalized nutrition plan. Value goals: LDL < 70mg , HDL > 40 mg.       Tobacco Use Initial Evaluation: Social History   Tobacco Use  Smoking Status Former Smoker  Smokeless Tobacco Never Used  Tobacco Comment   quit vaping 10/2018.    Exercise Goals and Review: Exercise Goals    Row Name 05/14/19 0909             Exercise Goals   Increase Physical Activity  Yes       Intervention  Provide advice, education, support and counseling about physical activity/exercise needs.;Develop an individualized exercise prescription for aerobic and resistive training based on initial evaluation findings, risk stratification, comorbidities and participant's personal goals.       Expected Outcomes  Short Term: Attend rehab on a regular basis to increase amount of physical activity.;Long Term: Add in home  exercise to make exercise part of routine and to increase amount of physical activity.;Long Term: Exercising regularly at least 3-5 days a week.       Increase Strength and Stamina  Yes       Intervention  Provide advice, education, support and counseling about physical activity/exercise needs.;Develop an individualized exercise prescription for aerobic and resistive training based on initial evaluation findings, risk stratification, comorbidities and participant's personal goals.       Expected Outcomes  Short Term: Increase workloads from initial exercise prescription for resistance, speed, and METs.;Short Term: Perform resistance training exercises routinely during rehab and add in resistance training at home;Long Term: Improve cardiorespiratory fitness, muscular endurance and strength as measured by increased METs and functional capacity (6MWT)       Able to understand and use rate of perceived exertion (RPE) scale  Yes       Intervention  Provide education and explanation on how  to use RPE scale       Expected Outcomes  Short Term: Able to use RPE daily in rehab to express subjective intensity level;Long Term:  Able to use RPE to guide intensity level when exercising independently       Able to understand and use Dyspnea scale  Yes       Intervention  Provide education and explanation on how to use Dyspnea scale       Expected Outcomes  Short Term: Able to use Dyspnea scale daily in rehab to express subjective sense of shortness of breath during exertion;Long Term: Able to use Dyspnea scale to guide intensity level when exercising independently       Knowledge and understanding of Target Heart Rate Range (THRR)  Yes       Intervention  Provide education and explanation of THRR including how the numbers were predicted and where they are located for reference       Expected Outcomes  Short Term: Able to use daily as guideline for intensity in rehab;Long Term: Able to use THRR to govern intensity when  exercising independently;Short Term: Able to state/look up THRR       Able to check pulse independently  Yes       Intervention  Provide education and demonstration on how to check pulse in carotid and radial arteries.;Review the importance of being able to check your own pulse for safety during independent exercise       Expected Outcomes  Short Term: Able to explain why pulse checking is important during independent exercise;Long Term: Able to check pulse independently and accurately       Understanding of Exercise Prescription  Yes       Intervention  Provide education, explanation, and written materials on patient's individual exercise prescription       Expected Outcomes  Short Term: Able to explain program exercise prescription;Long Term: Able to explain home exercise prescription to exercise independently          Copy of goals given to participant.

## 2019-05-21 ENCOUNTER — Encounter: Payer: BC Managed Care – PPO | Attending: Internal Medicine

## 2019-05-21 ENCOUNTER — Other Ambulatory Visit: Payer: Self-pay

## 2019-05-21 DIAGNOSIS — Z955 Presence of coronary angioplasty implant and graft: Secondary | ICD-10-CM

## 2019-05-21 DIAGNOSIS — Z6841 Body Mass Index (BMI) 40.0 and over, adult: Secondary | ICD-10-CM | POA: Insufficient documentation

## 2019-05-21 DIAGNOSIS — I2510 Atherosclerotic heart disease of native coronary artery without angina pectoris: Secondary | ICD-10-CM | POA: Insufficient documentation

## 2019-05-21 DIAGNOSIS — Z7902 Long term (current) use of antithrombotics/antiplatelets: Secondary | ICD-10-CM | POA: Insufficient documentation

## 2019-05-21 DIAGNOSIS — G4730 Sleep apnea, unspecified: Secondary | ICD-10-CM | POA: Insufficient documentation

## 2019-05-21 DIAGNOSIS — F329 Major depressive disorder, single episode, unspecified: Secondary | ICD-10-CM | POA: Insufficient documentation

## 2019-05-21 DIAGNOSIS — E785 Hyperlipidemia, unspecified: Secondary | ICD-10-CM | POA: Insufficient documentation

## 2019-05-21 DIAGNOSIS — Z7982 Long term (current) use of aspirin: Secondary | ICD-10-CM | POA: Insufficient documentation

## 2019-05-21 DIAGNOSIS — I10 Essential (primary) hypertension: Secondary | ICD-10-CM | POA: Insufficient documentation

## 2019-05-21 DIAGNOSIS — Z79899 Other long term (current) drug therapy: Secondary | ICD-10-CM | POA: Insufficient documentation

## 2019-05-21 DIAGNOSIS — M1990 Unspecified osteoarthritis, unspecified site: Secondary | ICD-10-CM | POA: Insufficient documentation

## 2019-05-21 DIAGNOSIS — E119 Type 2 diabetes mellitus without complications: Secondary | ICD-10-CM | POA: Insufficient documentation

## 2019-05-21 NOTE — Progress Notes (Signed)
Incomplete Session Note  Patient Details  Name: Brandon Henderson MRN: [id number] Date of Birth: [birth year] Referring Provider:     Cardiac Rehab from 05/14/2019 in [facility]  Referring Provider  End, Harrell Gave MD [facility] Cardiologist: Dr. Christia Reading Gollan]      [name] did not complete his rehab session.  Patient reported having chest pain at rest. Sent secure chat to cardiologist. Patient verbalizes understanding to contact his cardiologist after leaving. He reports chest pain 3-4 our of 10. He states chest pain for a couple days now. Informed patient that he need a physicians note to return to Cardiac Rehab.

## 2019-05-27 ENCOUNTER — Telehealth: Payer: Self-pay

## 2019-05-27 NOTE — Telephone Encounter (Signed)
L MOM to offer appt on 8/12.

## 2019-05-28 ENCOUNTER — Telehealth: Payer: Self-pay | Admitting: Internal Medicine

## 2019-05-28 NOTE — Telephone Encounter (Signed)
Patient calling in regarding having a visitor at his upcoming appointment. Patient states that he is not deaf/blind but feels that his wife needs to come to his appointment. Patient was offered to use speaker phone with wife while in exam room but said that did not work previous 2 times. Please advise regarding a visitor

## 2019-05-29 ENCOUNTER — Encounter: Payer: Self-pay | Admitting: *Deleted

## 2019-05-29 DIAGNOSIS — Z955 Presence of coronary angioplasty implant and graft: Secondary | ICD-10-CM

## 2019-05-29 NOTE — Progress Notes (Signed)
Cardiac Individual Treatment Plan  Patient Details  Name: Brandon Henderson MRN: [id number] Date of Birth: 1956-09-05 Referring Provider:     Cardiac Rehab from 05/14/2019 in [facility] Cardiac and Pulmonary Rehab  Referring Provider  End, Harrell Gave MD [facility] Cardiologist: Dr. Ida Rogue      Initial Encounter Date:    Cardiac Rehab from 05/14/2019 in [facility] Cardiac and Pulmonary Rehab  Date  05/14/19      Visit Diagnosis: Status post coronary artery stent placement  Patient's Home Medications on Admission:  Current Outpatient Medications:  .  Ascorbic Acid (VITAMIN C) 1000 MG tablet, Take 1,000 mg by mouth at bedtime. , Disp: , Rfl:  .  aspirin EC 81 MG tablet, Take 81 mg by mouth at bedtime. , Disp: , Rfl:  .  atorvastatin (LIPITOR) 40 MG tablet, TAKE 1 TABLET BY MOUTH ONCE A DAY (Patient taking differently: Take 40 mg by mouth at bedtime. ), Disp: 90 tablet, Rfl: 3 .  clopidogrel (PLAVIX) 75 MG tablet, Take 1 tablet (75 mg total) by mouth daily with breakfast., Disp: 90 tablet, Rfl: 3 .  Coenzyme Q10 (COQ10) 200 MG CAPS, Take 200 mg by mouth at bedtime., Disp: , Rfl:  .  escitalopram (LEXAPRO) 10 MG tablet, Take 10 mg by mouth at bedtime. , Disp: , Rfl:  .  furosemide (LASIX) 40 MG tablet, Take 1 tablet (40 mg total) by mouth daily., Disp: 30 tablet, Rfl: 3 .  Glucosamine-Chondroitin (COSAMIN DS PO), Take 1 tablet by mouth at bedtime., Disp: , Rfl:  .  isosorbide mononitrate (IMDUR) 30 MG 24 hr tablet, Take 1 tablet (30 mg total) by mouth daily., Disp: 90 tablet, Rfl: 3 .  magnesium oxide (MAG-OX) 400 MG tablet, Take 400 mg by mouth at bedtime., Disp: , Rfl:  .  Multiple Vitamin (MULTIVITAMIN WITH MINERALS) TABS tablet, Take 1 tablet by mouth at bedtime., Disp: , Rfl:  .  naproxen sodium (ALEVE) 220 MG tablet, Take 440 mg by mouth daily as needed (on work days ONLY)., Disp: , Rfl:  .  nitroGLYCERIN (NITROSTAT) 0.4 MG SL tablet, Place 1 tablet (0.4 mg total) under the tongue every  5 (five) minutes as needed for chest pain., Disp: 90 tablet, Rfl: 3 .  pantoprazole (PROTONIX) 40 MG tablet, Take 40 mg by mouth at bedtime. , Disp: , Rfl:  .  ramipril (ALTACE) 5 MG capsule, TAKE 1 CAPSULE BY MOUTH ONCE DAILY (Patient taking differently: Take 5 mg by mouth at bedtime. ), Disp: 90 capsule, Rfl: 3  Past Medical History: Past Medical History:  Diagnosis Date  . Arthritis   . Carotid arterial disease ([place])    a. 2011 s/p R CEA.  . Coronary artery disease    a. 2000 s/p PCI/BMS x 2 of OM2 [name], [place]); b. 12/2018 MV: EF 43% (likely 2/2 GI uptake artifact). No ischemia. Low risk; c. 03/2019 PCI: LM nl, LAD 36m LCX nl, OM2 90 ISR, RCA 40p, 867m3.5x15 Resolute Onyx DES), RPL2 70.  . Depression   . [place] complication [place]  . History of tobacco abuse   . Hyperlipidemia LDL goal <70   . Hypertension   . Morbid obesity ([facility]  . Osteoarthritis   . Sleep apnea     Tobacco Use: Social History   Tobacco Use  Smoking Status Former Smoker  Smokeless Tobacco Never Used  Tobacco Comment   quit vaping 10/2018.    Labs: Recent Review [employer]  for [facility] Cardiac and Pulmonary Rehab Latest Ref Rng & Units 01/01/2019   Cholestrol 100 - 199 mg/dL 151   LDLCALC 0 - 99 mg/dL 76   HDL >39 mg/dL 44   Trlycerides 0 - 149 mg/dL 154(H)   Hemoglobin A1c 4.8 - 5.6 % 6.2(H)       Exercise Target Goals: Exercise Program Goal: Individual exercise prescription set using results from initial 6 min walk test and THRR while considering  patient's activity barriers and safety.   Exercise Prescription Goal: Initial exercise prescription builds to 30-45 minutes a day of aerobic activity, 2-3 days per week.  Home exercise guidelines will be given to patient during program as part of exercise prescription that the participant will acknowledge.  Activity Barriers & Risk Stratification: Activity Barriers & Cardiac Risk Stratification - 05/14/19 0905       Activity Barriers & Cardiac Risk Stratification   Activity Barriers  Back Problems;Joint Problems;Neck/Spine Problems;Deconditioning;Muscular Weakness;Shortness of Breath;Chest Pain/Angina    Cardiac Risk Stratification  Moderate       6 Minute Walk: 6 Minute Walk    Row Name 05/14/19 0903         6 Minute Walk   Phase  Initial     Distance  1430 feet     Walk Time  6 minutes     # of Rest Breaks  0     MPH  2.71     METS  3.16     RPE  15     Perceived Dyspnea   3     VO2 Peak  11.07     Symptoms  Yes (comment)     Comments  chest pain 5/10 relieved with rest, SOB, R knee pain 4/10     Resting HR  68 bpm     Resting BP  126/70     Resting Oxygen Saturation   98 %     Exercise Oxygen Saturation  during 6 min walk  96 %     Max Ex. HR  113 bpm     Max Ex. BP  176/82     2 Minute Post BP  142/70        Oxygen Initial Assessment:   Oxygen Re-Evaluation:   Oxygen Discharge (Final Oxygen Re-Evaluation):   Initial Exercise Prescription: Initial Exercise Prescription - 05/14/19 0900      Date of Initial Exercise RX and Referring Provider   Date  05/14/19    Referring Provider  End, Harrell Gave MD   Primary Cardiologist: Dr. Ida Rogue     Treadmill   MPH  2.5    Grade  0.5    Minutes  15    METs  3.09      NuStep   Level  2    SPM  80    Minutes  15    METs  3      Recumbant Elliptical   Level  1    RPM  50    Minutes  15    METs  3      T5 Nustep   Level  2    SPM  80    Minutes  15    METs  3      Prescription Details   Frequency (times per week)  3    Duration  Progress to 30 minutes of continuous aerobic without signs/symptoms of physical distress      Intensity   THRR 40-80% of Max Heartrate  103-138    Ratings of Perceived Exertion  11-13    Perceived Dyspnea  0-4      Progression   Progression  Continue to progress workloads to maintain intensity without signs/symptoms of physical distress.      Resistance Training   Training  Prescription  Yes    Weight  3 lbs    Reps  10-15       Perform Capillary Blood Glucose checks as needed.  Exercise Prescription Changes: Exercise Prescription Changes    Row Name 05/14/19 0900             Response to Exercise   Blood Pressure (Admit)  126/70       Blood Pressure (Exercise)  176/82       Blood Pressure (Exit)  142/70       Heart Rate (Admit)  68 bpm       Heart Rate (Exercise)  113 bpm       Heart Rate (Exit)  79 bpm       Oxygen Saturation (Admit)  98 %       Oxygen Saturation (Exercise)  96 %       Rating of Perceived Exertion (Exercise)  15       Perceived Dyspnea (Exercise)  3       Symptoms  chest pain 5/10 (relieved with rest), SOB, r knee pain 4/10       Comments  walk test results          Exercise Comments:   Exercise Goals and Review: Exercise Goals    Row Name 05/14/19 0909             Exercise Goals   Increase Physical Activity  Yes       Intervention  Provide advice, education, support and counseling about physical activity/exercise needs.;Develop an individualized exercise prescription for aerobic and resistive training based on initial evaluation findings, risk stratification, comorbidities and participant's personal goals.       Expected Outcomes  Short Term: Attend rehab on a regular basis to increase amount of physical activity.;Long Term: Add in home exercise to make exercise part of routine and to increase amount of physical activity.;Long Term: Exercising regularly at least 3-5 days a week.       Increase Strength and Stamina  Yes       Intervention  Provide advice, education, support and counseling about physical activity/exercise needs.;Develop an individualized exercise prescription for aerobic and resistive training based on initial evaluation findings, risk stratification, comorbidities and participant's personal goals.       Expected Outcomes  Short Term: Increase workloads from initial exercise prescription for resistance,  speed, and METs.;Short Term: Perform resistance training exercises routinely during rehab and add in resistance training at home;Long Term: Improve cardiorespiratory fitness, muscular endurance and strength as measured by increased METs and functional capacity (6MWT)       Able to understand and use rate of perceived exertion (RPE) scale  Yes       Intervention  Provide education and explanation on how to use RPE scale       Expected Outcomes  Short Term: Able to use RPE daily in rehab to express subjective intensity level;Long Term:  Able to use RPE to guide intensity level when exercising independently       Able to understand and use Dyspnea scale  Yes       Intervention  Provide education and explanation on how to use Dyspnea scale  Expected Outcomes  Short Term: Able to use Dyspnea scale daily in rehab to express subjective sense of shortness of breath during exertion;Long Term: Able to use Dyspnea scale to guide intensity level when exercising independently       Knowledge and understanding of Target Heart Rate Range (THRR)  Yes       Intervention  Provide education and explanation of THRR including how the numbers were predicted and where they are located for reference       Expected Outcomes  Short Term: Able to use daily as guideline for intensity in rehab;Long Term: Able to use THRR to govern intensity when exercising independently;Short Term: Able to state/look up THRR       Able to check pulse independently  Yes       Intervention  Provide education and demonstration on how to check pulse in carotid and radial arteries.;Review the importance of being able to check your own pulse for safety during independent exercise       Expected Outcomes  Short Term: Able to explain why pulse checking is important during independent exercise;Long Term: Able to check pulse independently and accurately       Understanding of Exercise Prescription  Yes       Intervention  Provide education,  explanation, and written materials on patient's individual exercise prescription       Expected Outcomes  Short Term: Able to explain program exercise prescription;Long Term: Able to explain home exercise prescription to exercise independently          Exercise Goals Re-Evaluation :   Discharge Exercise Prescription (Final Exercise Prescription Changes): Exercise Prescription Changes - 05/14/19 0900      Response to Exercise   Blood Pressure (Admit)  126/70    Blood Pressure (Exercise)  176/82    Blood Pressure (Exit)  142/70    Heart Rate (Admit)  68 bpm    Heart Rate (Exercise)  113 bpm    Heart Rate (Exit)  79 bpm    Oxygen Saturation (Admit)  98 %    Oxygen Saturation (Exercise)  96 %    Rating of Perceived Exertion (Exercise)  15    Perceived Dyspnea (Exercise)  3    Symptoms  chest pain 5/10 (relieved with rest), SOB, r knee pain 4/10    Comments  walk test results       Nutrition:  Target Goals: Understanding of nutrition guidelines, daily intake of sodium <1535m, cholesterol <2041m calories 30% from fat and 7% or less from saturated fats, daily to have 5 or more servings of fruits and vegetables.  Biometrics: Pre Biometrics - 05/14/19 0910      Pre Biometrics   Height  5' 6.5" (1.689 m)    Weight  253 lb 4.8 oz (114.9 kg)    BMI (Calculated)  40.28    Single Leg Stand  30 seconds        Nutrition Therapy Plan and Nutrition Goals: Nutrition Therapy & Goals - 05/14/19 0918      Nutrition Therapy   Diet  Low Na HH diet    Drug/Food Interactions  Statins/Certain Fruits   lipitor   Protein (specify units)  95g    Fiber  30 grams    Whole Grain Foods  3 servings    Saturated Fats  12 max. grams    Fruits and Vegetables  5 servings/day    Sodium  1.5 grams      Personal Nutrition Goals   Nutrition  Goal  ST: reduce Na intake to 1500-2072m range LT: increase functional years and extend working years    Comments  Pt reports works at [employer]and 5am starting.  If 330am will drink veggie juice, if 5am will drink coffee. Pt reports regardless will have B at 7am (instant oatmeal, fruit, small amount of brown sugar). L is multigrain bread with tKu[place]cold cuts and some vegetable or fruit. D baked/grilled/sauteed ("healthy oil" his wife uses) fish or meat with wedge salad (cheese, olives, balsalmic, iceburg) or other veggies (roasted with the same oil). Pt will have yasso (frozen yogurt pop) for snack and beer (lime salt and lime wedge) or wine (~4x/week). Pt reports knowing what to do but getting back into it. Will go out to eat once per month but will have associated guilt afterwards. Pt once did diet (if high fat, low CHO - if high CHO, low fat) wants to try to move towards that. Pt will use spice mix with salt, lime/lemon, and pepper instead of conventional salt. Pt will sometimes use NoSalt, but doesn't care for it on certain things. RD noted A1c 6.2, fasting BG no higher than 121 (these are prediabetic numbers), inquired with pt because diagnosis of diabetes in chart, pt reports not having diabetes, he was told he had prediabetes. Pt reports going to sleep at 630-7pm. Pt reports taking water pill, he will try to weight himslef and check his legs more often.      Intervention Plan   Intervention  Prescribe, educate and counsel regarding individualized specific dietary modifications aiming towards targeted core components such as weight, hypertension, lipid management, diabetes, heart failure and other comorbidities.;Nutrition handout(s) given to patient.    Expected Outcomes  Short Term Goal: Understand basic principles of dietary content, such as calories, fat, sodium, cholesterol and nutrients.;Short Term Goal: A plan has been developed with personal nutrition goals set during dietitian appointment.;Long Term Goal: Adherence to prescribed nutrition plan.       Nutrition Assessments: Nutrition Assessments - 05/13/19 0754      MEDFICTS Scores   Pre Score  18        Nutrition Goals Re-Evaluation:   Nutrition Goals Discharge (Final Nutrition Goals Re-Evaluation):   Psychosocial: Target Goals: Acknowledge presence or absence of significant depression and/or stress, maximize coping skills, provide positive support system. Participant is able to verbalize types and ability to use techniques and skills needed for reducing stress and depression.   Initial Review & Psychosocial Screening: Initial Psych Review & Screening - 05/10/19 0954      Initial Review   Current issues with  Current Stress Concerns    Comments  RKyndalreports doing well now that he is back to work post Stent. He and his wife have been doing well during the pandemic. He said he feels a less stressed now that he can technically retire any time from his job at the [employer]  Yes   wife     Barriers   Psychosocial barriers to participate in program  There are no identifiable barriers or psychosocial needs.      Screening Interventions   Interventions  Encouraged to exercise    Expected Outcomes  Short Term goal: Utilizing psychosocial counselor, staff and physician to assist with identification of specific Stressors or current issues interfering with healing process. Setting desired goal for each stressor or current issue identified.;Long Term Goal: Stressors or current issues are  controlled or eliminated.;Short Term goal: Identification and review with participant of any Quality of Life or Depression concerns found by scoring the questionnaire.;Long Term goal: The participant improves quality of Life and PHQ9 Scores as seen by post scores and/or verbalization of changes       Quality of Life Scores:  Quality of Life - 05/13/19 0753      Quality of Life   Select  Quality of Life      Quality of Life Scores   Health/Function Pre  15.93 %    Socioeconomic Pre  16.25 %    Psych/Spiritual Pre  21.21 %    Family Pre   25.9 %    GLOBAL Pre  18.48 %      Scores of 19 and below usually indicate a poorer quality of life in these areas.  A difference of  2-3 points is a clinically meaningful difference.  A difference of 2-3 points in the total score of the Quality of Life Index has been associated with significant improvement in overall quality of life, self-image, physical symptoms, and general health in studies assessing change in quality of life.  PHQ-9: Recent Review Flowsheet Data    Depression screen [facility] 2/9 05/14/2019   Decreased Interest 1   Down, Depressed, Hopeless 0   PHQ - 2 Score 1   Altered sleeping 0   Tired, decreased energy 2   Change in appetite 0   Feeling bad or failure about yourself  0   Trouble concentrating 0   Moving slowly or fidgety/restless 0   Suicidal thoughts 0   PHQ-9 Score 3   Difficult doing work/chores Not difficult at all     Interpretation of Total Score  Total Score Depression Severity:  1-4 = Minimal depression, 5-9 = Mild depression, 10-14 = Moderate depression, 15-19 = Moderately severe depression, 20-27 = Severe depression   Psychosocial Evaluation and Intervention:   Psychosocial Re-Evaluation:   Psychosocial Discharge (Final Psychosocial Re-Evaluation):   Vocational Rehabilitation: Provide vocational rehab assistance to qualifying candidates.   Vocational Rehab Evaluation & Intervention: Vocational Rehab - 05/10/19 0953      Initial Vocational Rehab Evaluation & Intervention   Assessment shows need for Vocational Rehabilitation  No       Education: Education Goals: Education classes will be provided on a variety of topics geared toward better understanding of heart health and risk factor modification. Participant will state understanding/return demonstration of topics presented as noted by education test scores.  Learning Barriers/Preferences: Learning Barriers/Preferences - 05/10/19 0953      Learning Barriers/Preferences   Learning  Barriers  None    Learning Preferences  None       Education Topics:  AED/CPR: - Group verbal and written instruction with the use of models to demonstrate the basic use of the AED with the basic ABC's of resuscitation.   General Nutrition Guidelines/Fats and Fiber: -Group instruction provided by verbal, written material, models and posters to present the general guidelines for heart healthy nutrition. Gives an explanation and review of dietary fats and fiber.   Controlling Sodium/Reading Food Labels: -Group verbal and written material supporting the discussion of sodium use in heart healthy nutrition. Review and explanation with models, verbal and written materials for utilization of the food label.   Exercise Physiology & General Exercise Guidelines: - Group verbal and written instruction with models to review the exercise physiology of the cardiovascular system and associated critical values. Provides general exercise guidelines with specific guidelines to  those with heart or lung disease.    Aerobic Exercise & Resistance Training: - Gives group verbal and written instruction on the various components of exercise. Focuses on aerobic and resistive training programs and the benefits of this training and how to safely progress through these programs..   Flexibility, Balance, Mind/Body Relaxation: Provides group verbal/written instruction on the benefits of flexibility and balance training, including mind/body exercise modes such as yoga, pilates and tai chi.  Demonstration and skill practice provided.   Stress and Anxiety: - Provides group verbal and written instruction about the health risks of elevated stress and causes of high stress.  Discuss the correlation between heart/lung disease and anxiety and treatment options. Review healthy ways to manage with stress and anxiety.   Depression: - Provides group verbal and written instruction on the correlation between heart/lung  disease and depressed mood, treatment options, and the stigmas associated with seeking treatment.   Anatomy & Physiology of the Heart: - Group verbal and written instruction and models provide basic cardiac anatomy and physiology, with the coronary electrical and arterial systems. Review of Valvular disease and Heart Failure   Cardiac Procedures: - Group verbal and written instruction to review commonly prescribed medications for heart disease. Reviews the medication, class of the drug, and side effects. Includes the steps to properly store meds and maintain the prescription regimen. (beta blockers and nitrates)   Cardiac Medications I: - Group verbal and written instruction to review commonly prescribed medications for heart disease. Reviews the medication, class of the drug, and side effects. Includes the steps to properly store meds and maintain the prescription regimen.   Cardiac Medications II: -Group verbal and written instruction to review commonly prescribed medications for heart disease. Reviews the medication, class of the drug, and side effects. (all other drug classes)    Go Sex-Intimacy & Heart Disease, Get SMART - Goal Setting: - Group verbal and written instruction through game format to discuss heart disease and the return to sexual intimacy. Provides group verbal and written material to discuss and apply goal setting through the application of the S.M.A.R.T. Method.   Other Matters of the Heart: - Provides group verbal, written materials and models to describe Stable Angina and Peripheral Artery. Includes description of the disease process and treatment options available to the cardiac patient.   Exercise & Equipment Safety: - Individual verbal instruction and demonstration of equipment use and safety with use of the equipment.   Infection Prevention: - Provides verbal and written material to individual with discussion of infection control including proper hand  washing and proper equipment cleaning during exercise session.   Falls Prevention: - Provides verbal and written material to individual with discussion of falls prevention and safety.   Diabetes: - Individual verbal and written instruction to review signs/symptoms of diabetes, desired ranges of glucose level fasting, after meals and with exercise. Acknowledge that pre and post exercise glucose checks will be done for 3 sessions at entry of program.   Know Your Numbers and Risk Factors: -Group verbal and written instruction about important numbers in your health.  Discussion of what are risk factors and how they play a role in the disease process.  Review of Cholesterol, Blood Pressure, Diabetes, and BMI and the role they play in your overall health.   Sleep Hygiene: -Provides group verbal and written instruction about how sleep can affect your health.  Define sleep hygiene, discuss sleep cycles and impact of sleep habits. Review good sleep hygiene tips.  Other: -Provides group and verbal instruction on various topics (see comments)   Knowledge Questionnaire Score: Knowledge Questionnaire Score - 05/13/19 0754      Knowledge Questionnaire Score   Pre Score  26/26       Core Components/Risk Factors/Patient Goals at Admission: Personal Goals and Risk Factors at Admission - 05/14/19 0910      Core Components/Risk Factors/Patient Goals on Admission    Weight Management  Weight Loss;Yes;Obesity    Intervention  Weight Management: Develop a combined nutrition and exercise program designed to reach desired caloric intake, while maintaining appropriate intake of nutrient and fiber, sodium and fats, and appropriate energy expenditure required for the weight goal.;Weight Management: Provide education and appropriate resources to help participant work on and attain dietary goals.;Weight Management/Obesity: Establish reasonable short term and long term weight goals.;Obesity: Provide  education and appropriate resources to help participant work on and attain dietary goals.    Admit Weight  253 lb 4.8 oz (114.9 kg)    Goal Weight: Short Term  248 lb (112.5 kg)    Goal Weight: Long Term  240 lb (108.9 kg)    Expected Outcomes  Short Term: Continue to assess and modify interventions until short term weight is achieved;Long Term: Adherence to nutrition and physical activity/exercise program aimed toward attainment of established weight goal;Weight Loss: Understanding of general recommendations for a balanced deficit meal plan, which promotes 1-2 lb weight loss per week and includes a negative energy balance of [phone] kcal/d;Understanding recommendations for meals to include 15-35% energy as protein, 25-35% energy from fat, 35-60% energy from carbohydrates, less than 2105m of dietary cholesterol, 20-35 gm of total fiber daily;Understanding of distribution of calorie intake throughout the day with the consumption of 4-5 meals/snacks    Hypertension  Yes    Intervention  Provide education on lifestyle modifcations including regular physical activity/exercise, weight management, moderate sodium restriction and increased consumption of fresh fruit, vegetables, and low fat dairy, alcohol moderation, and smoking cessation.;Monitor prescription use compliance.    Expected Outcomes  Short Term: Continued assessment and intervention until BP is < 140/934mHG in hypertensive participants. < 130/8080mG in hypertensive participants with diabetes, heart failure or chronic kidney disease.;Long Term: Maintenance of blood pressure at goal levels.    Lipids  Yes    Intervention  Provide education and support for participant on nutrition & aerobic/resistive exercise along with prescribed medications to achieve LDL <39m94mDL >40mg63m Expected Outcomes  Short Term: Participant states understanding of desired cholesterol values and is compliant with medications prescribed. Participant is following exercise  prescription and nutrition guidelines.;Long Term: Cholesterol controlled with medications as prescribed, with individualized exercise RX and with personalized nutrition plan. Value goals: LDL < 39mg,66m > 40 mg.       Core Components/Risk Factors/Patient Goals Review:    Core Components/Risk Factors/Patient Goals at Discharge (Final Review):    ITP Comments: ITP Comments    Row Name 05/10/19 0952 05/14/19 0900 05/29/19 0638       ITP Comments  Virtual orientation completed. Diagnosis can be found in [facility] 7/7. EP and RD orientation scheduled for 7/28 8am  6MWT, gym orientation, and RD initial consult completed today.  Initial ITP created and sent to Dr. Mark MEmily Filbertcal Director.  30 Day Review Completed today. Continue with ITP unless changed by Medical Director review.   Orientation completed . Has not started sessions        Comments:

## 2019-05-30 ENCOUNTER — Encounter: Payer: Self-pay | Admitting: Internal Medicine

## 2019-05-30 ENCOUNTER — Ambulatory Visit (INDEPENDENT_AMBULATORY_CARE_PROVIDER_SITE_OTHER): Payer: BC Managed Care – PPO | Admitting: Internal Medicine

## 2019-05-30 ENCOUNTER — Other Ambulatory Visit: Payer: Self-pay

## 2019-05-30 VITALS — BP 116/62 | HR 69 | Ht 67.0 in | Wt 255.5 lb

## 2019-05-30 DIAGNOSIS — Z01810 Encounter for preprocedural cardiovascular examination: Secondary | ICD-10-CM

## 2019-05-30 DIAGNOSIS — R079 Chest pain, unspecified: Secondary | ICD-10-CM

## 2019-05-30 MED ORDER — DIPHENHYDRAMINE HCL 50 MG PO TABS: ORAL_TABLET | ORAL | 0 refills | Status: DC

## 2019-05-30 MED ORDER — PREDNISONE 50 MG PO TABS: ORAL_TABLET | ORAL | 0 refills | Status: DC

## 2019-05-30 MED ORDER — RANOLAZINE ER 500 MG PO TB12: 500.0000 mg | ORAL_TABLET | Freq: Two times a day (BID) | ORAL | 0 refills | Status: DC

## 2019-05-30 NOTE — Progress Notes (Signed)
Follow-up Outpatient Visit Date: 05/30/2019  Primary Care Provider: Kirk Ruths, MD [facility] [place] [address]   Primary cardiologist: Esmond Plants, MD PhD  Chief Complaint: Chest pain  HPI:  Mr. Riemenschneider is a 63 y.o. year-old male with history of coronary artery disease status post multiple PCI's, most recently to the RCA last month, hypertension, hyperlipidemia, carotid artery stenosis status post right carotid endarterectomy, obesity, sleep apnea, diabetes mellitus, depression, and arthritis, who presents for follow-up of coronary artery disease.  He was last seen in our office in mid July following PCI.  At that time, he still noticed slight chest pressure.  Today, Mr. Schippers reports that he has had almost continuous chest pressure ever since his PCI in early July.  It waxes and wanes in intensity and sometimes worsens with exertion.  Currently, he notes 1/10 chest pressure.  At other times, he is able to perform some strenuous tasks without significant discomfort.  He has taken nitroglycerin on at least one occasion and notes that it did not seem to offer much improvement.  He remains compliant with his medications, noting that isosorbide mononitrate has given him a headache but has not really improved his discomfort.  He feels as though his breathing is a little bit more labored than in the past.  Mr. Boodram has noticed occasional bleeding from his left nostril, often a few times a week.  He usually stops with brief compression for a minute, though he had one more prolonged episode.  He has not needed to go to the ER or see ENT.  He notes that his weight is stable.  He denies edema.  Mr. Knoop also notes continued discomfort in the right groin following last catheterization.  He had a small hematoma following cath and initially had quite a bit of soreness there.  That has improved, though he still has tenderness and sensitivity extending into the  right testicle.  This is gradually getting better.  --------------------------------------------------------------------------------------------------  Past Medical History:  Diagnosis Date  . Arthritis   . Carotid arterial disease ([place])    a. 2011 s/p R CEA.  . Coronary artery disease    a. 2000 s/p PCI/BMS x 2 of OM2 [name], [place]); b. 12/2018 MV: EF 43% (likely 2/2 GI uptake artifact). No ischemia. Low risk; c. 03/2019 PCI: LM nl, LAD 57m, LCX nl, OM2 90 ISR, RCA 40p, 48m (3.5x15 Resolute Onyx DES), RPL2 70.  . Depression   . Diabetes mellitus without complication ([place])   . History of tobacco abuse   . Hyperlipidemia LDL goal <70   . Hypertension   . Morbid obesity ([place])   . Osteoarthritis   . Sleep apnea    Past Surgical History:  Procedure Laterality Date  . artoscopic rotator cuff repair    . BACK SURGERY  1991  . CARDIAC CATHETERIZATION    . CAROTID ARTERY ANGIOPLASTY  2011  . carotid endartarectomy    . COLONOSCOPY WITH PROPOFOL N/A 06/12/2018   Procedure: COLONOSCOPY WITH PROPOFOL;  Surgeon: Toledo, Benay Pike, MD;  Location: [facility] ENDOSCOPY;  Service: Gastroenterology;  Laterality: N/A;  . CORONARY STENT INTERVENTION N/A 04/23/2019   Procedure: CORONARY STENT INTERVENTION;  Surgeon: Nelva Bush, MD;  Location: [place] [facility];  Service: Cardiovascular;  Laterality: N/A;  . ESOPHAGOGASTRODUODENOSCOPY (EGD) WITH PROPOFOL N/A 06/12/2018   Procedure: ESOPHAGOGASTRODUODENOSCOPY (EGD) WITH PROPOFOL;  Surgeon: Toledo, Benay Pike, MD;  Location: [facility] ENDOSCOPY;  Service: Gastroenterology;  Laterality:  N/A;  . LEFT HEART CATH AND CORONARY ANGIOGRAPHY N/A 04/23/2019   Procedure: LEFT HEART CATH AND CORONARY ANGIOGRAPHY;  Surgeon: Nelva Bush, MD;  Location: [place] [facility];  Service: Cardiovascular;  Laterality: N/A;  . Rotator cuff  2010    Current Meds  Medication Sig  . Ascorbic Acid (VITAMIN C) 1000 MG tablet Take 1,000 mg by mouth at bedtime.   Marland Kitchen aspirin EC 81 MG  tablet Take 81 mg by mouth at bedtime.   Marland Kitchen atorvastatin (LIPITOR) 40 MG tablet TAKE 1 TABLET BY MOUTH ONCE A DAY (Patient taking differently: Take 40 mg by mouth at bedtime. )  . clopidogrel (PLAVIX) 75 MG tablet Take 1 tablet (75 mg total) by mouth daily with breakfast.  . Coenzyme Q10 (COQ10) 200 MG CAPS Take 200 mg by mouth at bedtime.  Marland Kitchen escitalopram (LEXAPRO) 10 MG tablet Take 10 mg by mouth at bedtime.   . furosemide (LASIX) 40 MG tablet Take 1 tablet (40 mg total) by mouth daily.  . Glucosamine-Chondroitin (COSAMIN DS PO) Take 1 tablet by mouth at bedtime.  . isosorbide mononitrate (IMDUR) 30 MG 24 hr tablet Take 1 tablet (30 mg total) by mouth daily.  . magnesium oxide (MAG-OX) 400 MG tablet Take 400 mg by mouth at bedtime.  . Multiple Vitamin (MULTIVITAMIN WITH MINERALS) TABS tablet Take 1 tablet by mouth at bedtime.  . nitroGLYCERIN (NITROSTAT) 0.4 MG SL tablet Place 1 tablet (0.4 mg total) under the tongue every 5 (five) minutes as needed for chest pain.  . pantoprazole (PROTONIX) 40 MG tablet Take 40 mg by mouth at bedtime.   . ramipril (ALTACE) 5 MG capsule TAKE 1 CAPSULE BY MOUTH ONCE DAILY (Patient taking differently: Take 5 mg by mouth at bedtime. )    Allergies: Contrast media [iodinated diagnostic agents]  Social History   Tobacco Use  . Smoking status: Former Research scientist (life sciences)  . Smokeless tobacco: Never Used  . Tobacco comment: quit vaping 10/2018.  Substance Use Topics  . Alcohol use: Yes  . Drug use: Never    Family History  Problem Relation Age of Onset  . Heart attack Mother   . Stroke Father   . Colon cancer Sister     Review of Systems: A 12-system review of systems was performed and was negative except as noted in the HPI.  --------------------------------------------------------------------------------------------------  Physical Exam: BP 116/62 (BP Location: Left Arm, Patient Position: Sitting, Cuff Size: Normal)   Pulse 69   Ht 5\' 7"  (1.702 m)   Wt 255 lb  8 oz (115.9 kg)   BMI 40.02 kg/m   General: NAD.  Accompanied by his wife. HEENT: No conjunctival pallor or scleral icterus.  Facemask in place. Neck: Supple without lymphadenopathy, thyromegaly, JVD, or HJR. Lungs: Normal work of breathing. Clear to auscultation bilaterally without wheezes or crackles. Heart: Regular rate and rhythm without murmurs, rubs, or gallops. Non-displaced PMI. Abd: Bowel sounds present. Soft, NT/ND without hepatosplenomegaly Ext: No lower extremity edema. Radial, PT, and DP pulses are 2+ bilaterally.  Right femoral pulses 2+.  There is no bruising or hematoma.  No bruit appreciated.  Mild tenderness along the medial groin extending to the scrotum noted.  No discoloration evident. Skin: Warm and dry without rash.  EKG: Normal sinus rhythm without abnormality.  Lab Results  Component Value Date   WBC 20.0 (H) 04/24/2019   HGB 12.8 (L) 04/24/2019   HCT 40.1 04/24/2019   MCV 83.4 04/24/2019   PLT 242 04/24/2019  Lab Results  Component Value Date   NA 140 04/24/2019   K 5.5 (H) 04/24/2019   CL 104 04/24/2019   CO2 28 04/24/2019   BUN 20 04/24/2019   CREATININE 0.99 04/24/2019   GLUCOSE 121 (H) 04/24/2019   ALT 49 (H) 01/01/2019    Lab Results  Component Value Date   CHOL 151 01/01/2019   HDL 44 01/01/2019   LDLCALC 76 01/01/2019   TRIG 154 (H) 01/01/2019   CHOLHDL 3.4 01/01/2019    --------------------------------------------------------------------------------------------------  ASSESSMENT AND PLAN: Coronary artery disease with angina: Mr. Justus Memory continues to have chest pain, which has been an ongoing issue for several months.  It seems to be worse at times and was not significantly improved with PCI to the mid RCA in early July.  If anything, the patient feels like his pain may have gotten a little bit worse since then.  Interestingly, the discomfort waxes and wanes and is not clearly exertional.  Addition of isosorbide mononitrate last month  did not help.  He also notes a history of GERD, Barrett's esophagus, and hiatal hernia, which could underlie his discomfort.  Nonetheless, he has residual high-grade disease involving OM branch that was previously stented.  We discussed medical therapy versus repeat catheterization.  I do not think noninvasive testing would be of much utility, given negative stress test in March.  He currently notes vague 1 out of 10 chest pain; I have suggested that he be admitted or go to the ER for evaluation, though Mr. Fonseca states that his pain is been this way for weeks and he rather proceed with outpatient evaluation.  I advised him to seek immediate medical attention if he has any worsening of the chest pain.  We will make arrangements for cardiac catheterization and possible PCI with me next Tuesday.  I have reviewed the risks, indications, and alternatives to cardiac catheterization, possible angioplasty, and stenting with the patient. Risks include but are not limited to bleeding, infection, vascular injury, stroke, myocardial infection, arrhythmia, kidney injury, radiation-related injury in the case of prolonged fluoroscopy use, emergency cardiac surgery, and death. The patient understands the risks of serious complication is 1-2 in 4944 with diagnostic cardiac cath and 1-2% or less with angioplasty/stenting.  We will need to premedicate him with prednisone and diphenydramine, given history of IV contrast reaction.  In the meantime, we will also start ranolazine 500 mg twice daily to see if this helps improve his symptoms.  He should continue taking pantoprazole for treatment of possible GI causes.  Right groin pain: No evidence of residual hematoma or findings to suggest pseudoaneurysm.  I wonder if he has some nerve irritation, particularly given the fact the pain is now radiating mostly to the scrotum.  As it is gradually improving, I favor conservative management for now.  If symptoms worsen, we may need to  consider an ultrasound examination and/or your referral to urology if scrotal pain remains the predominant component.  We will plan for left radial access for upcoming catheterization, given very small right radial artery.  Hyperlipidemia: Continue atorvastatin 40 mg daily.  Follow-up: To be determined based on results of cardiac catheterization.  Greater than 40 minutes was spent face-to-face with the patient, of which more than half was devoted to counseling and review of prior cardiac catheterization and treatment options.  Nelva Bush, MD 05/30/2019 1:47 PM

## 2019-05-30 NOTE — H&P (View-Only) (Signed)
Follow-up Outpatient Visit Date: 05/30/2019  Primary Care Provider: Kirk Ruths, MD [facility] [place] [address]   Primary cardiologist: Esmond Plants, MD PhD  Chief Complaint: Chest pain  HPI:  Brandon Henderson is a 63 y.o. year-old male with history of coronary artery disease status post multiple PCI's, most recently to the RCA last month, hypertension, hyperlipidemia, carotid artery stenosis status post right carotid endarterectomy, obesity, sleep apnea, diabetes mellitus, depression, and arthritis, who presents for follow-up of coronary artery disease.  He was last seen in our office in mid July following PCI.  At that time, he still noticed slight chest pressure.  Today, Brandon Henderson reports that he has had almost continuous chest pressure ever since his PCI in early July.  It waxes and wanes in intensity and sometimes worsens with exertion.  Currently, he notes 1/10 chest pressure.  At other times, he is able to perform some strenuous tasks without significant discomfort.  He has taken nitroglycerin on at least one occasion and notes that it did not seem to offer much improvement.  He remains compliant with his medications, noting that isosorbide mononitrate has given him a headache but has not really improved his discomfort.  He feels as though his breathing is a little bit more labored than in the past.  Brandon Henderson has noticed occasional bleeding from his left nostril, often a few times a week.  He usually stops with brief compression for a minute, though he had one more prolonged episode.  He has not needed to go to the ER or see ENT.  He notes that his weight is stable.  He denies edema.  Brandon Henderson also notes continued discomfort in the right groin following last catheterization.  He had a small hematoma following cath and initially had quite a bit of soreness there.  That has improved, though he still has tenderness and sensitivity extending into the  right testicle.  This is gradually getting better.  --------------------------------------------------------------------------------------------------  Past Medical History:  Diagnosis Date  . Arthritis   . Carotid arterial disease ([place])    a. 2011 s/p R CEA.  . Coronary artery disease    a. 2000 s/p PCI/BMS x 2 of OM2 [name], [place]); b. 12/2018 MV: EF 43% (likely 2/2 GI uptake artifact). No ischemia. Low risk; c. 03/2019 PCI: LM nl, LAD 23m, LCX nl, OM2 90 ISR, RCA 40p, 57m (3.5x15 Resolute Onyx DES), RPL2 70.  . Depression   . Diabetes mellitus without complication ([place])   . History of tobacco abuse   . Hyperlipidemia LDL goal <70   . Hypertension   . Morbid obesity ([place])   . Osteoarthritis   . Sleep apnea    Past Surgical History:  Procedure Laterality Date  . artoscopic rotator cuff repair    . BACK SURGERY  1991  . CARDIAC CATHETERIZATION    . CAROTID ARTERY ANGIOPLASTY  2011  . carotid endartarectomy    . COLONOSCOPY WITH PROPOFOL N/A 06/12/2018   Procedure: COLONOSCOPY WITH PROPOFOL;  Surgeon: Toledo, Benay Pike, MD;  Location: [facility] END[facility]COPY;  Service: Gastroenterology;  Laterality: N/A;  . CORONARY STENT INTERVENTION N/A 04/23/2019   Procedure: CORONARY STENT INTERVENTION;  Surgeon: Nelva Bush, MD;  Location: [place] [facility];  Service: Cardiovascular;  Laterality: N/A;  . ESOPHAGOGASTRODUODENOSCOPY (EGD) WITH PROPOFOL N/A 06/12/2018   Procedure: ESOPHAGOGASTRODUODENOSCOPY (EGD) WITH PROPOFOL;  Surgeon: Toledo, Benay Pike, MD;  Location: [facility];  Service: Gastroenterology;  Laterality:  N/A;  . LEFT HEART CATH AND CORONARY ANGIOGRAPHY N/A 04/23/2019   Procedure: LEFT HEART CATH AND CORONARY ANGIOGRAPHY;  Surgeon: Nelva Bush, MD;  Location: [place] [facility];  Service: Cardiovascular;  Laterality: N/A;  . Rotator cuff  2010    Current Meds  Medication Sig  . Ascorbic Acid (VITAMIN C) 1000 MG tablet Take 1,000 mg by mouth at bedtime.   Marland Kitchen aspirin EC 81 MG  tablet Take 81 mg by mouth at bedtime.   Marland Kitchen atorvastatin (LIPITOR) 40 MG tablet TAKE 1 TABLET BY MOUTH ONCE A DAY (Patient taking differently: Take 40 mg by mouth at bedtime. )  . clopidogrel (PLAVIX) 75 MG tablet Take 1 tablet (75 mg total) by mouth daily with breakfast.  . Coenzyme Q10 (COQ10) 200 MG CAPS Take 200 mg by mouth at bedtime.  Marland Kitchen escitalopram (LEXAPRO) 10 MG tablet Take 10 mg by mouth at bedtime.   . furosemide (LASIX) 40 MG tablet Take 1 tablet (40 mg total) by mouth daily.  . Glucosamine-Chondroitin (COSAMIN DS PO) Take 1 tablet by mouth at bedtime.  . isosorbide mononitrate (IMDUR) 30 MG 24 hr tablet Take 1 tablet (30 mg total) by mouth daily.  . magnesium oxide (MAG-OX) 400 MG tablet Take 400 mg by mouth at bedtime.  . Multiple Vitamin (MULTIVITAMIN WITH MINERALS) TABS tablet Take 1 tablet by mouth at bedtime.  . nitroGLYCERIN (NITROSTAT) 0.4 MG SL tablet Place 1 tablet (0.4 mg total) under the tongue every 5 (five) minutes as needed for chest pain.  . pantoprazole (PROTONIX) 40 MG tablet Take 40 mg by mouth at bedtime.   . ramipril (ALTACE) 5 MG capsule TAKE 1 CAPSULE BY MOUTH ONCE DAILY (Patient taking differently: Take 5 mg by mouth at bedtime. )    Allergies: Contrast media [iodinated diagnostic agents]  Social History   Tobacco Use  . Smoking status: Former Research scientist (life sciences)  . Smokeless tobacco: Never Used  . Tobacco comment: quit vaping 10/2018.  Substance Use Topics  . Alcohol use: Yes  . Drug use: Never    Family History  Problem Relation Age of Onset  . Heart attack Mother   . Stroke Father   . Colon cancer Sister     Review of Systems: A 12-system review of systems was performed and was negative except as noted in the HPI.  --------------------------------------------------------------------------------------------------  Physical Exam: BP 116/62 (BP Location: Left Arm, Patient Position: Sitting, Cuff Size: Normal)   Pulse 69   Ht 5\' 7"  (1.702 m)   Wt 255 lb  8 oz (115.9 kg)   BMI 40.02 kg/m   General: NAD.  Accompanied by his wife. HEENT: No conjunctival pallor or scleral icterus.  Facemask in place. Neck: Supple without lymphadenopathy, thyromegaly, JVD, or HJR. Lungs: Normal work of breathing. Clear to auscultation bilaterally without wheezes or crackles. Heart: Regular rate and rhythm without murmurs, rubs, or gallops. Non-displaced PMI. Abd: Bowel sounds present. Soft, NT/ND without hepatosplenomegaly Ext: No lower extremity edema. Radial, PT, and DP pulses are 2+ bilaterally.  Right femoral pulses 2+.  There is no bruising or hematoma.  No bruit appreciated.  Mild tenderness along the medial groin extending to the scrotum noted.  No discoloration evident. Skin: Warm and dry without rash.  EKG: Normal sinus rhythm without abnormality.  Lab Results  Component Value Date   WBC 20.0 (H) 04/24/2019   HGB 12.8 (L) 04/24/2019   HCT 40.1 04/24/2019   MCV 83.4 04/24/2019   PLT 242 04/24/2019  Lab Results  Component Value Date   NA 140 04/24/2019   K 5.5 (H) 04/24/2019   CL 104 04/24/2019   CO2 28 04/24/2019   BUN 20 04/24/2019   CREATININE 0.99 04/24/2019   GLUCOSE 121 (H) 04/24/2019   ALT 49 (H) 01/01/2019    Lab Results  Component Value Date   CHOL 151 01/01/2019   HDL 44 01/01/2019   LDLCALC 76 01/01/2019   TRIG 154 (H) 01/01/2019   CHOLHDL 3.4 01/01/2019    --------------------------------------------------------------------------------------------------  ASSESSMENT AND PLAN: Coronary artery disease with angina: Brandon Henderson continues to have chest pain, which has been an ongoing issue for several months.  It seems to be worse at times and was not significantly improved with PCI to the mid RCA in early July.  If anything, the patient feels like his pain may have gotten a little bit worse since then.  Interestingly, the discomfort waxes and wanes and is not clearly exertional.  Addition of isosorbide mononitrate last month  did not help.  He also notes a history of GERD, Barrett's esophagus, and hiatal hernia, which could underlie his discomfort.  Nonetheless, he has residual high-grade disease involving OM branch that was previously stented.  We discussed medical therapy versus repeat catheterization.  I do not think noninvasive testing would be of much utility, given negative stress test in March.  He currently notes vague 1 out of 10 chest pain; I have suggested that he be admitted or go to the ER for evaluation, though Brandon Henderson states that his pain is been this way for weeks and he rather proceed with outpatient evaluation.  I advised him to seek immediate medical attention if he has any worsening of the chest pain.  We will make arrangements for cardiac catheterization and possible PCI with me next Tuesday.  I have reviewed the risks, indications, and alternatives to cardiac catheterization, possible angioplasty, and stenting with the patient. Risks include but are not limited to bleeding, infection, vascular injury, stroke, myocardial infection, arrhythmia, kidney injury, radiation-related injury in the case of prolonged fluoroscopy use, emergency cardiac surgery, and death. The patient understands the risks of serious complication is 1-2 in 9326 with diagnostic cardiac cath and 1-2% or less with angioplasty/stenting.  We will need to premedicate him with prednisone and diphenydramine, given history of IV contrast reaction.  In the meantime, we will also start ranolazine 500 mg twice daily to see if this helps improve his symptoms.  He should continue taking pantoprazole for treatment of possible GI causes.  Right groin pain: No evidence of residual hematoma or findings to suggest pseudoaneurysm.  I wonder if he has some nerve irritation, particularly given the fact the pain is now radiating mostly to the scrotum.  As it is gradually improving, I favor conservative management for now.  If symptoms worsen, we may need to  consider an ultrasound examination and/or your referral to urology if scrotal pain remains the predominant component.  We will plan for left radial access for upcoming catheterization, given very small right radial artery.  Hyperlipidemia: Continue atorvastatin 40 mg daily.  Follow-up: To be determined based on results of cardiac catheterization.  Greater than 40 minutes was spent face-to-face with the patient, of which more than half was devoted to counseling and review of prior cardiac catheterization and treatment options.  Nelva Bush, MD 05/30/2019 1:47 PM

## 2019-05-30 NOTE — Patient Instructions (Addendum)
Medication Instructions:  Your physician recommends that you continue on your current medications as directed. Please refer to the Current Medication list given to you today.  If you need a refill on your cardiac medications before your next appointment, please call your pharmacy.   Lab work: Your physician recommends that you return for lab work in: TODAY (BMP, CBC).  COVID Test - Tomorrow, Friday, May 31, 2019 at the [facility] drive thru screening.  You will need to go home and self-quarantine from after  the test and until the procedure.   If you have labs (blood work) drawn today and your tests are completely normal, you will receive your results only by: Marland [facility] MyChart Message (if you have MyChart) OR . A paper copy in the mail If you have any lab test that is abnormal or we need to change your treatment, we will call you to review the results.  Testing/Procedures:   You are scheduled for a Cardiac Catheterization on Tuesday, August 18 with Dr. Harrell Gave End.  1. Please arrive at the [facility] Entrance at 07:30 AM (This time is two hours before your procedure to ensure your preparation). Free valet parking service is available.   Special note: Every effort is made to have your procedure done on time. Please understand that emergencies sometimes delay scheduled procedures.  2. Diet: Do not eat solid foods after midnight.  The patient may have clear liquids until 5am upon the day of the procedure.  3. Labs: You will need to have blood drawn on Thursday, August 13 in the office.  4. Medication instructions in preparation for your procedure:   Contrast Allergy: Yes, Please take Prednisone 50mg  by mouth at: Thirteen hours prior to cath 8:00pm on Monday Seven hours prior to cath 1:00am on Tuesday And prior to leaving home please take last dose of Prednisone 50mg  and Benadryl 50mg  by mouth.   Stop taking, Lasix (Furosemide)  Tuesday, August 18,   On the morning of  your procedure, take your Aspirin and Plavix/Clopidogrel and any morning medicines NOT listed above.  You may use sips of water.  5. Plan for one night stay--bring personal belongings. 6. Bring a current list of your medications and current insurance cards. 7. You MUST have a responsible person to drive you home. 8. Someone MUST be with you the first 24 hours after you arrive home or your discharge will be delayed. 9. Please wear clothes that are easy to get on and off and wear slip-on shoes.  Thank you for allowing [place] to care for you!   -- [facility]      Follow-Up: At [facility], you and your health needs are our priority.  As part of our continuing mission to provide you with exceptional heart care, we have created designated Provider Care Teams.  These Care Teams include your primary Cardiologist (physician) and Advanced Practice Providers (APPs -  Physician Assistants and Nurse Practitioners) who all work together to provide you with the care you need, when you need it. You will need a follow up appointment in To be determined post catheterization.     Coronary Angiogram With Stent Coronary angiogram with stent placement is a procedure to widen or open a narrow blood vessel of the heart (coronary artery). Arteries may become blocked by cholesterol buildup (plaques) in the lining of the wall. When a coronary artery becomes partially blocked, blood flow to that area decreases. This may lead to chest pain  or a heart attack (myocardial infarction). A stent is a small piece of metal that looks like mesh or a spring. Stent placement may be done as treatment for a heart attack or right after a coronary angiogram in which a blocked artery is found. Let your health care provider know about:  Any allergies you have.  All medicines you are taking, including vitamins, herbs, eye drops, creams, and over-the-counter medicines.  Any problems you or family  members have had with anesthetic medicines.  Any blood disorders you have.  Any surgeries you have had.  Any medical conditions you have.  Whether you are pregnant or may be pregnant. What are the risks? Generally, this is a safe procedure. However, problems may occur, including:  Damage to the heart or its blood vessels.  A return of blockage.  Bleeding, infection, or bruising at the insertion site.  A collection of blood under the skin (hematoma) at the insertion site.  A blood clot in another part of the body.  Kidney injury.  Allergic reaction to the dye or contrast that is used.  Bleeding into the abdomen (retroperitoneal bleeding). What happens before the procedure? Staying hydrated Follow instructions from your health care provider about hydration, which may include:  Up to 2 hours before the procedure - you may continue to drink clear liquids, such as water, clear fruit juice, black coffee, and plain tea.  Eating and drinking restrictions Follow instructions from your health care provider about eating and drinking, which may include:  8 hours before the procedure - stop eating heavy meals or foods such as meat, fried foods, or fatty foods.  6 hours before the procedure - stop eating light meals or foods, such as toast or cereal.  2 hours before the procedure - stop drinking clear liquids. Ask your health care provider about:  Changing or stopping your regular medicines. This is especially important if you are taking diabetes medicines or blood thinners.  Taking medicines such as ibuprofen. These medicines can thin your blood. Do not take these medicines before your procedure if your health care provider instructs you not to. Generally, aspirin is recommended before a procedure of passing a small, thin tube (catheter) through a blood vessel and into the heart (cardiac catheterization). What happens during the procedure?   An IV tube will be inserted into one of  your veins.  You will be given one or more of the following: ? A medicine to help you relax (sedative). ? A medicine to numb the area where the catheter will be inserted into an artery (local anesthetic).  To reduce your risk of infection: ? Your health care team will wash or sanitize their hands. ? Your skin will be washed with soap. ? Hair may be removed from the area where the catheter will be inserted.  Using a guide wire, the catheter will be inserted into an artery. The location may be in your groin, in your wrist, or in the fold of your arm (near your elbow).  A type of X-ray (fluoroscopy) will be used to help guide the catheter to the opening of the arteries in the heart.  A dye will be injected into the catheter, and X-rays will be taken. The dye will help to show where any narrowing or blockages are located in the arteries.  A tiny wire will be guided to the blocked spot, and a balloon will be inflated to make the artery wider.  The stent will be expanded and will  crush the plaques into the wall of the vessel. The stent will hold the area open and improve the blood flow. Most stents have a drug coating to reduce the risk of the stent narrowing over time.  The artery may be made wider using a drill, laser, or other tools to remove plaques.  When the blood flow is better, the catheter will be removed. The lining of the artery will grow over the stent, which stays where it was placed. This procedure may vary among health care providers and hospitals. What happens after the procedure?  If the procedure is done through the leg, you will be kept in bed lying flat for about 6 hours. You will be instructed to not bend and not cross your legs.  The insertion site will be checked frequently.  The pulse in your foot or wrist will be checked frequently.  You may have additional blood tests, X-rays, and a test that records the electrical activity of your heart (electrocardiogram, or  ECG). This information is not intended to replace advice given to you by your health care provider. Make sure you discuss any questions you have with your health care provider. Document Released: 04/09/2003 Document Revised: 01/12/2018 Document Reviewed: 05/08/2016 Elsevier Patient Education  2020 [employer].

## 2019-05-31 ENCOUNTER — Telehealth: Payer: Self-pay

## 2019-05-31 ENCOUNTER — Other Ambulatory Visit
Admission: RE | Admit: 2019-05-31 | Discharge: 2019-05-31 | Disposition: A | Discharge status: Home / Self Care | Payer: BC Managed Care – PPO | Source: Ambulatory Visit | Attending: Internal Medicine | Admitting: Internal Medicine

## 2019-05-31 DIAGNOSIS — Z01812 Encounter for preprocedural laboratory examination: Secondary | ICD-10-CM | POA: Diagnosis present

## 2019-05-31 DIAGNOSIS — Z20828 Contact with and (suspected) exposure to other viral communicable diseases: Secondary | ICD-10-CM | POA: Diagnosis not present

## 2019-05-31 LAB — CBC WITH DIFFERENTIAL/PLATELET
Basophils Absolute: 0.1 10*3/uL (ref 0.0–0.2)
Basos: 1 %
EOS (ABSOLUTE): 0.3 10*3/uL (ref 0.0–0.4)
Eos: 4 %
Hematocrit: 41.1 % (ref 37.5–51.0)
Hemoglobin: 13.2 g/dL (ref 13.0–17.7)
Immature Grans (Abs): 0 10*3/uL (ref 0.0–0.1)
Immature Granulocytes: 0 %
Lymphocytes Absolute: 2 10*3/uL (ref 0.7–3.1)
Lymphs: 22 %
MCH: 26.6 pg (ref 26.6–33.0)
MCHC: 32.1 g/dL (ref 31.5–35.7)
MCV: 83 fL (ref 79–97)
Monocytes Absolute: 1.2 10*3/uL — ABNORMAL HIGH (ref 0.1–0.9)
Monocytes: 13 %
Neutrophils Absolute: 5.6 10*3/uL (ref 1.4–7.0)
Neutrophils: 60 %
Platelets: 292 10*3/uL (ref 150–450)
RBC: 4.97 x10E6/uL (ref 4.14–5.80)
RDW: 14.2 % (ref 11.6–15.4)
WBC: 9.3 10*3/uL (ref 3.4–10.8)

## 2019-05-31 LAB — BASIC METABOLIC PANEL
BUN/Creatinine Ratio: 16 (ref 10–24)
BUN: 18 mg/dL (ref 8–27)
CO2: 26 mmol/L (ref 20–29)
Calcium: 9.6 mg/dL (ref 8.6–10.2)
Chloride: 99 mmol/L (ref 96–106)
Creatinine, Ser: 1.1 mg/dL (ref 0.76–1.27)
GFR calc Af Amer: 81 mL/min/{1.73_m2} (ref >59)
GFR calc non Af Amer: 70 mL/min/{1.73_m2} (ref >59)
Glucose: 88 mg/dL (ref 65–99)
Potassium: 4.7 mmol/L (ref 3.5–5.2)
Sodium: 140 mmol/L (ref 134–144)

## 2019-05-31 NOTE — Telephone Encounter (Signed)
PA started through Covermymeds.   Your PA has been faxed to the plan as a paper copy. Please contact the plan directly if you haven't received a determination in a typical timeframe.  You will be notified of the determination via fax.

## 2019-05-31 NOTE — Telephone Encounter (Signed)
Patient calling to check status of prior auth  Please advise

## 2019-05-31 NOTE — Telephone Encounter (Signed)
Called patient.  Made him aware the PA has been submitted.  Awaiting decision.

## 2019-06-01 LAB — SARS CORONAVIRUS 2 (TAT 6-24 HRS): SARS Coronavirus 2: NEGATIVE

## 2019-06-03 ENCOUNTER — Ambulatory Visit: Payer: BC Managed Care – PPO | Admitting: Cardiovascular Disease

## 2019-06-04 ENCOUNTER — Ambulatory Visit
Admission: RE | Admit: 2019-06-04 | Discharge: 2019-06-05 | Disposition: A | Discharge status: Home / Self Care | Payer: BC Managed Care – PPO | Attending: Internal Medicine | Admitting: Internal Medicine

## 2019-06-04 ENCOUNTER — Encounter: Payer: Self-pay | Admitting: *Deleted

## 2019-06-04 ENCOUNTER — Encounter
Admission: RE | Disposition: A | Discharge status: Home / Self Care | Payer: BC Managed Care – PPO | Source: Home / Self Care | Attending: Internal Medicine

## 2019-06-04 ENCOUNTER — Other Ambulatory Visit: Payer: Self-pay

## 2019-06-04 DIAGNOSIS — Z7982 Long term (current) use of aspirin: Secondary | ICD-10-CM | POA: Insufficient documentation

## 2019-06-04 DIAGNOSIS — Z79899 Other long term (current) drug therapy: Secondary | ICD-10-CM | POA: Insufficient documentation

## 2019-06-04 DIAGNOSIS — I25110 Atherosclerotic heart disease of native coronary artery with unstable angina pectoris: Secondary | ICD-10-CM | POA: Diagnosis not present

## 2019-06-04 DIAGNOSIS — Z87891 Personal history of nicotine dependence: Secondary | ICD-10-CM | POA: Diagnosis not present

## 2019-06-04 DIAGNOSIS — F329 Major depressive disorder, single episode, unspecified: Secondary | ICD-10-CM | POA: Diagnosis not present

## 2019-06-04 DIAGNOSIS — Z6839 Body mass index (BMI) 39.0-39.9, adult: Secondary | ICD-10-CM | POA: Diagnosis not present

## 2019-06-04 DIAGNOSIS — I10 Essential (primary) hypertension: Secondary | ICD-10-CM | POA: Insufficient documentation

## 2019-06-04 DIAGNOSIS — Z955 Presence of coronary angioplasty implant and graft: Secondary | ICD-10-CM | POA: Insufficient documentation

## 2019-06-04 DIAGNOSIS — I200 Unstable angina: Secondary | ICD-10-CM | POA: Diagnosis present

## 2019-06-04 DIAGNOSIS — R079 Chest pain, unspecified: Secondary | ICD-10-CM

## 2019-06-04 DIAGNOSIS — E785 Hyperlipidemia, unspecified: Secondary | ICD-10-CM | POA: Diagnosis not present

## 2019-06-04 DIAGNOSIS — G4730 Sleep apnea, unspecified: Secondary | ICD-10-CM | POA: Diagnosis not present

## 2019-06-04 DIAGNOSIS — Z7902 Long term (current) use of antithrombotics/antiplatelets: Secondary | ICD-10-CM | POA: Diagnosis not present

## 2019-06-04 DIAGNOSIS — I2510 Atherosclerotic heart disease of native coronary artery without angina pectoris: Secondary | ICD-10-CM | POA: Diagnosis present

## 2019-06-04 DIAGNOSIS — M1990 Unspecified osteoarthritis, unspecified site: Secondary | ICD-10-CM | POA: Diagnosis not present

## 2019-06-04 DIAGNOSIS — E119 Type 2 diabetes mellitus without complications: Secondary | ICD-10-CM | POA: Diagnosis not present

## 2019-06-04 DIAGNOSIS — I6529 Occlusion and stenosis of unspecified carotid artery: Secondary | ICD-10-CM | POA: Diagnosis not present

## 2019-06-04 HISTORY — DX: Barrett's esophagus without dysplasia: K22.70

## 2019-06-04 HISTORY — PX: LEFT HEART CATH AND CORONARY ANGIOGRAPHY: CATH118249

## 2019-06-04 HISTORY — PX: CORONARY BALLOON ANGIOPLASTY: CATH118233

## 2019-06-04 HISTORY — DX: Personal history of other diseases of the digestive system: Z87.19

## 2019-06-04 HISTORY — DX: Separation of muscle (nontraumatic), other site: M62.08

## 2019-06-04 HISTORY — DX: Personal history of urinary calculi: Z87.442

## 2019-06-04 HISTORY — DX: Angina pectoris, unspecified: I20.9

## 2019-06-04 HISTORY — DX: Gastro-esophageal reflux disease without esophagitis: K21.9

## 2019-06-04 LAB — POCT ACTIVATED CLOTTING TIME
Activated Clotting Time: 246 seconds
Activated Clotting Time: 274 seconds
Activated Clotting Time: 285 seconds

## 2019-06-04 LAB — GLUCOSE, CAPILLARY: Glucose-Capillary: 177 mg/dL — ABNORMAL HIGH (ref 70–99)

## 2019-06-04 SURGERY — LEFT HEART CATH AND CORONARY ANGIOGRAPHY
Anesthesia: Moderate Sedation

## 2019-06-04 MED ORDER — CLOPIDOGREL BISULFATE 75 MG PO TABS: 75.0000 mg | ORAL_TABLET | Freq: Every day | ORAL | Status: DC

## 2019-06-04 MED ORDER — LABETALOL HCL 5 MG/ML IV SOLN: 10.0000 mg | INTRAVENOUS | Status: AC | PRN

## 2019-06-04 MED ORDER — NITROGLYCERIN 0.4 MG SL SUBL: 0.4000 mg | SUBLINGUAL_TABLET | SUBLINGUAL | Status: DC | PRN

## 2019-06-04 MED ORDER — ISOSORBIDE MONONITRATE ER 30 MG PO TB24
30.0000 mg | ORAL_TABLET | Freq: Every day | ORAL | Status: DC
  Administered 2019-06-04: 30 mg via ORAL
  Filled 2019-06-04: qty 1

## 2019-06-04 MED ORDER — ONDANSETRON HCL 4 MG/2ML IJ SOLN
INTRAMUSCULAR | Status: AC
  Filled 2019-06-04: qty 2

## 2019-06-04 MED ORDER — ACETAMINOPHEN 325 MG PO TABS
ORAL_TABLET | ORAL | Status: AC
  Filled 2019-06-04: qty 2

## 2019-06-04 MED ORDER — FENTANYL CITRATE (PF) 100 MCG/2ML IJ SOLN
INTRAMUSCULAR | Status: AC
  Filled 2019-06-04: qty 2

## 2019-06-04 MED ORDER — MIDAZOLAM HCL 2 MG/2ML IJ SOLN
INTRAMUSCULAR | Status: DC | PRN
  Administered 2019-06-04 (×2): 1 mg via INTRAVENOUS

## 2019-06-04 MED ORDER — SODIUM CHLORIDE 0.9 % WEIGHT BASED INFUSION
1.0000 mL/kg/h | INTRAVENOUS | Status: DC
  Administered 2019-06-04: 1 mL/kg/h via INTRAVENOUS

## 2019-06-04 MED ORDER — SODIUM CHLORIDE 0.9 % IV SOLN: 250.0000 mL | INTRAVENOUS | Status: DC | PRN

## 2019-06-04 MED ORDER — SODIUM CHLORIDE 0.9% FLUSH: 3.0000 mL | INTRAVENOUS | Status: DC | PRN

## 2019-06-04 MED ORDER — SODIUM CHLORIDE 0.9 % WEIGHT BASED INFUSION
3.0000 mL/kg/h | INTRAVENOUS | Status: DC
  Administered 2019-06-04: 3 mL/kg/h via INTRAVENOUS

## 2019-06-04 MED ORDER — LIDOCAINE HCL (PF) 1 % IJ SOLN
INTRAMUSCULAR | Status: DC | PRN
  Administered 2019-06-04: 1 mL

## 2019-06-04 MED ORDER — MIDAZOLAM HCL 2 MG/2ML IJ SOLN
INTRAMUSCULAR | Status: AC
  Filled 2019-06-04: qty 2

## 2019-06-04 MED ORDER — CLOPIDOGREL BISULFATE 75 MG PO TABS
ORAL_TABLET | ORAL | Status: AC
  Filled 2019-06-04: qty 4

## 2019-06-04 MED ORDER — RANOLAZINE ER 500 MG PO TB12
500.0000 mg | ORAL_TABLET | Freq: Two times a day (BID) | ORAL | Status: DC
  Administered 2019-06-04 – 2019-06-05 (×3): 500 mg via ORAL
  Filled 2019-06-04 (×4): qty 1

## 2019-06-04 MED ORDER — NITROGLYCERIN 5 MG/ML IV SOLN
INTRAVENOUS | Status: AC
  Filled 2019-06-04: qty 10

## 2019-06-04 MED ORDER — ATORVASTATIN CALCIUM 20 MG PO TABS
40.0000 mg | ORAL_TABLET | Freq: Every day | ORAL | Status: DC
  Administered 2019-06-04: 40 mg via ORAL
  Filled 2019-06-04: qty 2

## 2019-06-04 MED ORDER — ENOXAPARIN SODIUM 40 MG/0.4ML ~~LOC~~ SOLN
40.0000 mg | SUBCUTANEOUS | Status: DC
  Filled 2019-06-04: qty 0.4

## 2019-06-04 MED ORDER — ASPIRIN EC 81 MG PO TBEC
81.0000 mg | DELAYED_RELEASE_TABLET | Freq: Every day | ORAL | Status: DC
  Administered 2019-06-04: 81 mg via ORAL
  Filled 2019-06-04: qty 1

## 2019-06-04 MED ORDER — PSYLLIUM 95 % PO PACK
1.0000 | PACK | Freq: Every day | ORAL | Status: DC
  Administered 2019-06-04: 1 via ORAL
  Filled 2019-06-04 (×2): qty 1

## 2019-06-04 MED ORDER — NITROGLYCERIN IN D5W 200-5 MCG/ML-% IV SOLN
INTRAVENOUS | Status: AC | PRN
  Administered 2019-06-04: 10 ug/min via INTRAVENOUS

## 2019-06-04 MED ORDER — SODIUM CHLORIDE 0.9% FLUSH: 3.0000 mL | Freq: Two times a day (BID) | INTRAVENOUS | Status: DC

## 2019-06-04 MED ORDER — ACETAMINOPHEN 325 MG PO TABS
650.0000 mg | ORAL_TABLET | ORAL | Status: DC | PRN
  Administered 2019-06-04 (×2): 650 mg via ORAL
  Filled 2019-06-04: qty 2

## 2019-06-04 MED ORDER — VERAPAMIL HCL 2.5 MG/ML IV SOLN
INTRAVENOUS | Status: AC
  Filled 2019-06-04: qty 2

## 2019-06-04 MED ORDER — VERAPAMIL HCL 2.5 MG/ML IV SOLN
INTRAVENOUS | Status: DC | PRN
  Administered 2019-06-04: 09:00:00 10 mL via INTRA_ARTERIAL

## 2019-06-04 MED ORDER — HEPARIN (PORCINE) IN NACL 1000-0.9 UT/500ML-% IV SOLN
INTRAVENOUS | Status: DC | PRN
  Administered 2019-06-04: 500 mL

## 2019-06-04 MED ORDER — MAGNESIUM OXIDE 400 (241.3 MG) MG PO TABS
400.0000 mg | ORAL_TABLET | Freq: Every day | ORAL | Status: DC
  Administered 2019-06-04: 400 mg via ORAL
  Filled 2019-06-04: qty 1

## 2019-06-04 MED ORDER — SODIUM CHLORIDE 0.9 % IV SOLN
INTRAVENOUS | Status: AC
  Administered 2019-06-04: 18:00:00 via INTRAVENOUS

## 2019-06-04 MED ORDER — RAMIPRIL 5 MG PO CAPS
5.0000 mg | ORAL_CAPSULE | Freq: Every day | ORAL | Status: DC
  Administered 2019-06-04: 5 mg via ORAL
  Filled 2019-06-04 (×2): qty 1

## 2019-06-04 MED ORDER — PANTOPRAZOLE SODIUM 40 MG PO TBEC
40.0000 mg | DELAYED_RELEASE_TABLET | Freq: Every day | ORAL | Status: DC
  Administered 2019-06-04: 40 mg via ORAL
  Filled 2019-06-04: qty 1

## 2019-06-04 MED ORDER — CLOPIDOGREL BISULFATE 75 MG PO TABS
300.0000 mg | ORAL_TABLET | Freq: Once | ORAL | Status: AC
  Administered 2019-06-04: 300 mg via ORAL

## 2019-06-04 MED ORDER — NITROGLYCERIN 1 MG/10 ML FOR IR/CATH LAB
INTRA_ARTERIAL | Status: DC | PRN
  Administered 2019-06-04 (×2): 200 ug

## 2019-06-04 MED ORDER — FENTANYL CITRATE (PF) 100 MCG/2ML IJ SOLN
INTRAMUSCULAR | Status: DC | PRN
  Administered 2019-06-04: 50 ug via INTRAVENOUS
  Administered 2019-06-04 (×2): 25 ug via INTRAVENOUS

## 2019-06-04 MED ORDER — SODIUM CHLORIDE 0.9% FLUSH
3.0000 mL | Freq: Two times a day (BID) | INTRAVENOUS | Status: DC
  Administered 2019-06-04: 3 mL via INTRAVENOUS

## 2019-06-04 MED ORDER — ONDANSETRON HCL 4 MG/2ML IJ SOLN
INTRAMUSCULAR | Status: DC | PRN
  Administered 2019-06-04: 4 mg via INTRAVENOUS

## 2019-06-04 MED ORDER — HEPARIN (PORCINE) IN NACL 1000-0.9 UT/500ML-% IV SOLN
INTRAVENOUS | Status: AC
  Filled 2019-06-04: qty 1000

## 2019-06-04 MED ORDER — NITROGLYCERIN IN D5W 200-5 MCG/ML-% IV SOLN
0.0000 ug/min | INTRAVENOUS | Status: DC
  Administered 2019-06-04: 30 ug/min via INTRAVENOUS

## 2019-06-04 MED ORDER — DIPHENHYDRAMINE HCL 25 MG PO CAPS
25.0000 mg | ORAL_CAPSULE | Freq: Once | ORAL | Status: AC
  Administered 2019-06-05: 25 mg via ORAL
  Filled 2019-06-04: qty 1

## 2019-06-04 MED ORDER — HEPARIN SODIUM (PORCINE) 1000 UNIT/ML IJ SOLN
INTRAMUSCULAR | Status: DC | PRN
  Administered 2019-06-04: 3000 [IU] via INTRAVENOUS
  Administered 2019-06-04: 6000 [IU] via INTRAVENOUS
  Administered 2019-06-04: 5000 [IU] via INTRAVENOUS

## 2019-06-04 MED ORDER — ESCITALOPRAM OXALATE 10 MG PO TABS
10.0000 mg | ORAL_TABLET | Freq: Every day | ORAL | Status: DC
  Administered 2019-06-04: 10 mg via ORAL
  Filled 2019-06-04 (×2): qty 1

## 2019-06-04 MED ORDER — HEPARIN SODIUM (PORCINE) 1000 UNIT/ML IJ SOLN
INTRAMUSCULAR | Status: AC
  Filled 2019-06-04: qty 1

## 2019-06-04 MED ORDER — ONDANSETRON HCL 4 MG/2ML IJ SOLN: 4.0000 mg | Freq: Four times a day (QID) | INTRAMUSCULAR | Status: DC | PRN

## 2019-06-04 MED ORDER — HYDRALAZINE HCL 20 MG/ML IJ SOLN: 10.0000 mg | INTRAMUSCULAR | Status: AC | PRN

## 2019-06-04 MED ORDER — METOPROLOL TARTRATE 25 MG PO TABS
25.0000 mg | ORAL_TABLET | Freq: Two times a day (BID) | ORAL | Status: DC
  Administered 2019-06-04: 25 mg via ORAL
  Filled 2019-06-04: qty 1

## 2019-06-04 MED ORDER — EZETIMIBE 10 MG PO TABS
10.0000 mg | ORAL_TABLET | Freq: Every day | ORAL | Status: DC
  Administered 2019-06-04: 20:00:00 10 mg via ORAL
  Filled 2019-06-04: qty 1

## 2019-06-04 SURGICAL SUPPLY — 18 items
BALLN EUPHORA RX 2.0X15 (BALLOONS) ×4
BALLN ~~LOC~~ TREK RX 2.0X8 (BALLOONS) ×4
BALLOON EUPHORA RX 2.0X15 (BALLOONS) ×2 IMPLANT
BALLOON ~~LOC~~ TREK RX 2.0X8 (BALLOONS) ×2 IMPLANT
CATH INFINITI 5FR ANG PIGTAIL (CATHETERS) ×4 IMPLANT
CATH INFINITI 5FR JL4 (CATHETERS) ×4 IMPLANT
CATH INFINITI JR4 5F (CATHETERS) ×4 IMPLANT
CATH LAUNCHER 6FR EBU 3 (CATHETERS) ×4 IMPLANT
CATH VISTA GUIDE 6FR XB3.5 (CATHETERS) ×4 IMPLANT
DEVICE INFLAT 30 PLUS (MISCELLANEOUS) ×4 IMPLANT
DEVICE RAD TR BAND REGULAR (VASCULAR PRODUCTS) ×4 IMPLANT
GLIDESHEATH SLEND SS 6F .021 (SHEATH) ×4 IMPLANT
KIT MANI 3VAL PERCEP (MISCELLANEOUS) ×4 IMPLANT
PACK CARDIAC CATH (CUSTOM PROCEDURE TRAY) ×4 IMPLANT
WIRE G HI TQ BMW 190 (WIRE) ×4 IMPLANT
WIRE HITORQ VERSACORE ST 145CM (WIRE) ×4 IMPLANT
WIRE ROSEN-J .035X260CM (WIRE) ×4 IMPLANT
WIRE RUNTHROUGH .014X180CM (WIRE) ×4 IMPLANT

## 2019-06-04 NOTE — Interval H&P Note (Signed)
History and Physical Interval Note:  06/04/2019 8:40 AM  Brandon Henderson  has presented today for cardiac catheterization, with the diagnosis of unstable angina.  The various methods of treatment have been discussed with the patient and family. After consideration of risks, benefits and other options for treatment, the patient has consented to  Procedure(s): LEFT HEART CATH AND CORONARY ANGIOGRAPHY (Left) as a surgical intervention.  The patient's history has been reviewed, patient examined, no change in status, stable for surgery.  I have reviewed the patient's chart and labs.  Questions were answered to the patient's satisfaction.    Cath Lab Visit (complete for each Cath Lab visit)  Clinical Evaluation Leading to the Procedure:   ACS: Yes.    Non-ACS:    Anginal Classification: CCS IV  Anti-ischemic medical therapy: Minimal Therapy (1 class of medications)  Non-Invasive Test Results: Low-risk stress test findings: cardiac mortality <1%/year  Prior CABG: No previous CABG  Brandon Henderson

## 2019-06-04 NOTE — Brief Op Note (Signed)
BRIEF CARDIAC CATHETERIZATION NOTE  06/04/2019  10:57 AM  PATIENT:  Brandon Henderson  63 y.o. male  PRE-OPERATIVE DIAGNOSIS:  Unstable angina  POST-OPERATIVE DIAGNOSIS:  Unstable angina  PROCEDURE:  Procedure(s): LEFT HEART CATH AND CORONARY ANGIOGRAPHY (Left) CORONARY BALLOON ANGIOPLASTY (N/A)  SURGEON:  Surgeon(s) and Role:    * Marjo Grosvenor, MD - Primary  FINDINGS: 1. 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 2. Widely patent mid RCA stent. 3. Normal LVEDP. 4. Complex PTCA to mid LCx/OM2 using Euphora 2.0 x 15 mm and [place] Euphora 2.0 x 8 mm with improvement in stenosis from 90% -> 30-40%.  I was unable to cover the lesion with a stent despite aggressive predilation and buddy wire use.  RECOMMENDATIONS: 1. Continue DAPT was aspirin and clopidogrel. 2. Continue antianginal therapy; wean NTG infusion as tolerated. 3. Aggressive secondary prevention.  Nelva Bush, MD [facility] Pager: [phone]

## 2019-06-04 NOTE — Plan of Care (Signed)

## 2019-06-05 ENCOUNTER — Encounter: Payer: Self-pay | Admitting: Internal Medicine

## 2019-06-05 ENCOUNTER — Telehealth: Payer: Self-pay | Admitting: Internal Medicine

## 2019-06-05 DIAGNOSIS — I25110 Atherosclerotic heart disease of native coronary artery with unstable angina pectoris: Secondary | ICD-10-CM | POA: Diagnosis not present

## 2019-06-05 LAB — CBC
HCT: 36.2 % — ABNORMAL LOW (ref 39.0–52.0)
Hemoglobin: 11.4 g/dL — ABNORMAL LOW (ref 13.0–17.0)
MCH: 26.6 pg (ref 26.0–34.0)
MCHC: 31.5 g/dL (ref 30.0–36.0)
MCV: 84.4 fL (ref 80.0–100.0)
Platelets: 240 10*3/uL (ref 150–400)
RBC: 4.29 MIL/uL (ref 4.22–5.81)
RDW: 14.7 % (ref 11.5–15.5)
WBC: 14.5 10*3/uL — ABNORMAL HIGH (ref 4.0–10.5)
nRBC: 0 % (ref 0.0–0.2)

## 2019-06-05 LAB — BASIC METABOLIC PANEL
Anion gap: 8 (ref 5–15)
BUN: 16 mg/dL (ref 8–23)
CO2: 24 mmol/L (ref 22–32)
Calcium: 8.7 mg/dL — ABNORMAL LOW (ref 8.9–10.3)
Chloride: 107 mmol/L (ref 98–111)
Creatinine, Ser: 0.93 mg/dL (ref 0.61–1.24)
GFR calc Af Amer: >60 mL/min (ref >60)
GFR calc non Af Amer: >60 mL/min (ref >60)
Glucose, Bld: 109 mg/dL — ABNORMAL HIGH (ref 70–99)
Potassium: 4.1 mmol/L (ref 3.5–5.1)
Sodium: 139 mmol/L (ref 135–145)

## 2019-06-05 MED ORDER — METOPROLOL TARTRATE 25 MG PO TABS
12.5000 mg | ORAL_TABLET | Freq: Two times a day (BID) | ORAL | Status: DC
  Administered 2019-06-05: 12.5 mg via ORAL
  Filled 2019-06-05: qty 1

## 2019-06-05 MED ORDER — METOPROLOL TARTRATE 25 MG PO TABS: 12.5000 mg | ORAL_TABLET | Freq: Two times a day (BID) | ORAL | 3 refills | Status: DC

## 2019-06-05 NOTE — Discharge Instructions (Signed)
You have received care from [facility] during this hospital stay and we look forward to continuing to provide you with excellent care in our office settings after you've left the hospital.  In order to assure a smoother transition to home following your discharge from the hospital, we will likely have you see one of our nurse practitioners or physician assistants within a few weeks of discharge.  Our advanced practice providers work closely with your physician in order to address all of your heart's needs in a timely manner.  More information about all of our providers may be found here: [email or web address]   Please plan to bring all of your prescriptions to your follow-up appointment and don't hesitate to contact us with questions or concerns.  [address], [place] [address] --------------------------------------  CATHETERIZATION: NO HEAVY LIFTING X 4 WEEKS. NO SEXUAL ACTIVITY X 4 WEEKS. NO SOAKING BATHS, HOT TUBS, POOLS, ETC., X 7 DAYS.   Radial Site Care Refer to this sheet in the next few weeks. These instructions provide you with information on caring for yourself after your procedure. Your caregiver may also give you more specific instructions. Your treatment has been planned according to current medical practices, but problems sometimes occur. Call your caregiver if you have any problems or questions after your procedure. HOME CARE INSTRUCTIONS  You may shower the day after the procedure. Remove the bandage (dressing) and gently wash the site with plain soap and water. Gently pat the site dry.   Do not apply powder or lotion to the site.   Do not submerge the affected site in water for 3 to 5 days.   Inspect the site at least twice daily.   Do not flex or bend the affected arm for 24 hours.   No lifting over 5 pounds (2.3 kg) for  5 days after your procedure.  What to expect:  Any bruising will usually fade within 1 to 2 weeks.   Blood that collects in the tissue (hematoma) may be painful to the touch. It should usually decrease in size and tenderness within 1 to 2 weeks.  SEEK IMMEDIATE MEDICAL CARE IF:  You have unusual pain at the radial site.   You have redness, warmth, swelling, or pain at the radial site.   You have drainage (other than a small amount of blood on the dressing).   You have chills.   You have a fever or persistent symptoms for more than 72 hours.   You have a fever and your symptoms suddenly get worse.   Your arm becomes pale, cool, tingly, or numb.   You have heavy bleeding from the site. Hold pressure on the site.       10 Habits of Highly Healthy [place] wants to help you get well and stay well.  Live a longer, healthier life by practicing healthy habits every day.  1.  Visit your primary care provider regularly. 2.  Make time for family and friends.  Healthy relationships are important. 3.  Take medications as directed by your provider. 4.  Maintain a healthy weight and a trim waistline. 5.  Eat healthy meals and snacks, rich in fruits, vegetables, whole grains, and lean proteins. 6.  Get moving every day - aim for 150 minutes of moderate physical activity each week. 7.  Don't smoke. 8.  Avoid alcohol or drink in moderation. 9.  Manage stress through  meditation or mindful relaxation. 10.  Get seven to nine hours of quality sleep each night.  Want more information on healthy habits?  To learn more about these and other healthy habits, visit [employer][email or web address].

## 2019-06-05 NOTE — Discharge Summary (Signed)
Discharge Summary    Patient ID: Brandon Henderson MRN: [id number]; DOB: June 20, 1956  Admit date: 06/04/2019 Discharge date: 06/05/2019  Primary Care Provider: Kirk Ruths, MD  Primary Cardiologist: Ida Rogue, MD  Primary Electrophysiologist:  None   Discharge Diagnoses    Principal Problem:   Coronary artery disease involving native coronary artery Active Problems:   Unstable angina ([place])   Controlled type 2 diabetes mellitus without complication, without long-term current use of insulin (HCC)   Allergies Allergies  Allergen Reactions  . Contrast Media [Iodinated Diagnostic Agents] Rash    Diagnostic Studies/Procedures    Left heart cardiac catheterization and coronary angiography 06/04/2019 Coronary Findings  Diagnostic Dominance: Right Left Main  Vessel is moderate in size. Vessel is angiographically normal.  Left Anterior Descending  Vessel is moderate in size.  Mid LAD lesion 30% stenosed  Mid LAD lesion is 30% stenosed.  First Diagonal Branch  Vessel is moderate in size.  Second Diagonal Branch  Vessel is small in size.  Left Circumflex  Vessel is moderate in size.  First Obtuse Marginal Branch  Vessel is small in size.  Second Obtuse Marginal Branch  Vessel is moderate in size.  2nd Mrg lesion 90% stenosed  2nd Mrg lesion is 90% stenosed. The lesion was previously treated using a bare metal stent over 2 years ago. Previously placed stent displays restenosis.  Third Obtuse Marginal Branch  Vessel is small in size.  Right Coronary Artery  Vessel is large.  Prox RCA lesion 40% stenosed  Prox RCA lesion is 40% stenosed.  Mid RCA lesion 0% stenosed  Previously placed Mid RCA drug eluting stent is widely patent. The lesion is eccentric.  Dist RCA lesion 50% stenosed  Dist RCA lesion is 50% stenosed.  Right Posterior Descending Artery  Vessel is moderate in size.  First Right Posterolateral Branch  Vessel is small in size.  Second Right  Posterolateral Branch  Vessel is moderate in size.  2nd RPL lesion 70% stenosed  2nd RPL lesion is 70% stenosed.  Intervention  2nd Mrg lesion  Angioplasty  Lesion length: 30 mm. WIRE RUNTHROUGH .[id number] guidewire used to cross lesion. Balloon angioplasty was performed.  Post-Intervention Lesion Assessment  The intervention was successful. Pre-interventional TIMI flow is 3. Post-intervention TIMI flow is 3. No complications occurred at this lesion.  There is a 40% residual stenosis post intervention.    Left Heart  Left Ventricle LV end diastolic pressure is normal.  Aortic Valve There is no aortic valve stenosis.     Coronary Diagrams  Diagnostic Dominance: Right  Intervention    Conclusions: 1. Stable appearance of the coronary arteries with severe OM 2 disease (suspected in-stent restenosis), as well as mild to moderate disease involving the LAD and RCA. 2. Widely patent mid RCA stent. 3. Normal left ventricular filling pressure. 4. Challenging balloon angioplasty to OM2 with acceptable angiographic result leading to reduction of stenosis from 90%->40%.  Lesion could not be covered with a new stent.  Recommendations: 1. Overnight extended recovery. 2. Wean nitroglycerin infusion (started for angina and elevated blood pressure), as tolerated. 3. Initiate ranolazine and metoprolol for antianginal therapy. 4. Dual antiplatelet therapy with aspirin and clopidogrel for at least 12 months. 5. Aggressive secondary prevention.  Nelva Bush, MD [facility] Pager: [phone]) [phone]_____________   History of Present Illness     Brandon Henderson is a 63 year old male with history of CAD s/p multiple PCI's, most recently to the RCA 04/2019, hypertension, hyperlipidemia, carotid artery stenosis  s/p right carotid endarterectomy, obesity, sleep apnea, DM2, depression, and arthritis.    He is s/p BMS of the obtuse marginal in [place], [place] in 2000. Starting approximately the fall of  2019, he started experiencing intermittent exertional chest pressure with associated dyspnea, typically lasting about a minute, and resolving with rest.  He underwent stress testing which showed no evidence of ischemia.  EF was depressed at 43%; however, this was felt to be secondary to GI uptake artifact.  Despite negative stress test, he continued to have intermittent exertional chest tightness, mostly noted when walking at work.  This chest tightness was always associated with dyspnea and could sometimes radiate to his arms or jaw.  On at least one episode, he noted mild nausea.  He underwent CT angiography of the chest which was negative for PE.  He was referred back to cardiology for further evaluation.    Given his ongoing sx, diagnostic catheterization was performed 04/2019. This LHC showed significant 2v CAD, including long 90% OM2 stenosis (suspect ISR) and 80-90% mRCA lesion, moderately elevated LVEDP 25-61mmHg, and normal LVEF. PCI was performed to the [facility] using DES. Recommendations were for DAPT with ASA and clopidogrel for at least 3 months and ideally 6+ months, as well as aggressive secondary prevention.   He presented to the office for follow-up of coronary artery disease s/p PCI and still noted slight chest pressure. During office visit on 05/30/2019, he noted almost continuous chest pressure ever since his PCI in early July.  He noted the chest pressure waxed and waned in intensity, sometimes worse with exertion.  At times, he was able to perform some strenuous tasks without significant discomfort.  He reportedly had taken his nitroglycerin on at least one occasion and noted that it did not seem to offer him much improvement.  He remained compliant with his medications, noting that isosorbide mononitrate had given him a headache but not really improved his discomfort.  He felt as if his breathing was a little bit more labored than in the past.  Given his ongoing chest pain for several months  that was not improved and possibly worse since 04/2019 PCI, repeat outpatient cardiac catheretization was scheduled for 06/04/2019. He was premedicated with prednisone and diphenhydramine, given h/o IV contrast reaction. He was also started on ranolazine 500mg  BID to see if this improved his sx. It was recommended he continue pantoprazole for any possible contribution from GI etiology.  Hospital Course     Consultants: None  On 06/04/2019, he presented to [facility] hospital and underwent LHC and coronary angiography with coronary balloon angioplasty.  Catheterization found multivessel CAD, including 90% OM 2 stenosis (suspect ISR) and mild to moderate LAD and RCA disease, widely patent mid RCA stent, and normal LVEDP.  Complex PTCA to mid left circumflex/LMT using Euphora 2.0 x 15 mm in [place] Euphora 2.0 x 8 mm was performed with improvement in stenosis from 90%   30 to 40%. It was noted that the lesion was not able to be covered with a stent, despite aggressive pre-dilation and buddy wire use.   He was kept overnight 8/18-8/19 at [facility] for recovery. Nitroglycerin infusion, started for angina and elevated blood pressure, has been successfully weaned. He was started on ranolazine and metoprolol for antianginal therapy. Unfortunately, insurance denied coverage for ranolazine before discharge, and this medication has been discontinued for that reason. As indicated by the denial letter in the patient's chart, pre-authorization for ranolazine was denied as the patient would need to fail  at least two beta blockers before coverage. Patient will therefore continue his asa 81mg  daily, clopidogrel 75mg  daily, metoprolol tartrate 12.5mg  BID, isosorbide mononitrate 30mg  daily, atorvastatin 40mg  daily, Zetia PRN SL nitro, ramipril 5mg  daily, and lasix 40mg  daily. Aggressive secondary prevention and continued use of PPI to prevent GERD also recommended.   MD has seen today 06/05/2019 and examined the patient today and feels patient  is stable for discharge. Labs have been reviewed and are stable. Follow-up will be scheduled in the office for 1-2 weeks after discharge.   _____________  Discharge Vitals Blood pressure 113/62, pulse 62, temperature 97.9 F (36.6 C), temperature source Oral, resp. rate 19, height 5\' 7"  (1.702 m), weight 115.7 kg, SpO2 100 %.  Filed Weights   06/04/19 0745 06/05/19 0323  Weight: 115.7 kg 115.7 kg    Labs & Radiologic Studies    CBC Recent Labs    06/05/19 0619  WBC 14.5*  HGB 11.4*  HCT 36.2*  MCV 84.4  PLT 024   Basic Metabolic Panel Recent Labs    06/05/19 0619  NA 139  K 4.1  CL 107  CO2 24  GLUCOSE 109*  BUN 16  CREATININE 0.93  CALCIUM 8.7*   Liver Function Tests No results for input(s): AST, ALT, ALKPHOS, BILITOT, PROT, ALBUMIN in the last 72 hours. No results for input(s): LIPASE, AMYLASE in the last 72 hours. Cardiac Enzymes No results for input(s): CKTOTAL, CKMB, CKMBINDEX, TROPONINI in the last 72 hours. BNP Invalid input(s): POCBNP D-Dimer No results for input(s): DDIMER in the last 72 hours. Hemoglobin A1C No results for input(s): HGBA1C in the last 72 hours. Fasting Lipid Panel No results for input(s): CHOL, HDL, LDLCALC, TRIG, CHOLHDL, LDLDIRECT in the last 72 hours. Thyroid Function Tests No results for input(s): TSH, T4TOTAL, T3FREE, THYROIDAB in the last 72 hours.  Invalid input(s): FREET3 _____________  No results found. Disposition   Physical Exam  General:  Well nourished, well developed, in no acute distress. Feels much improved HEENT: normal Neck: no JVD Vascular: No carotid bruits; radial pulses 2+ bilaterally.Radial catheterization site without signs of infection Cardiac:  normal S1, S2; bradycardic but regular; no murmur  Lungs:  clear to auscultation bilaterally, no wheezing, rhonchi or rales  Abd: soft, nontender, no hepatomegaly  Ext: no edema Musculoskeletal:  No deformities, BUE and BLE strength normal and equal Skin:  warm and dry  Neuro:  no focal abnormalities noted Psych:  Normal affect   Pt is being discharged home today in good condition.  Follow-up Plans & Appointments    Follow-up Information    [facility] Cardiac and Pulmonary Rehab Follow up.   Specialty: Cardiac Rehabilitation Why: Your Cardiologist has referred you to outpatient Cardiac Rehab at [facility].  The Cardiac Rehab dept will contact you within one to two weeks of discharge to schedule your next appointment.   Contact information: [place] [phone] ar [place] [employer] [phone]       Nelva Bush, MD Follow up.   Specialty: Cardiology Why: You will receive a call after discharge to schedule follow-up with Dr. Saunders Revel or an APP within two weeks of discharge. Please call [phone] if you do not receive this call after tdischarge.  Contact information: [place] [address] [phone]          Discharge Instructions    AMB Referral to Cardiac Rehabilitation - Phase II   Complete by: As directed    Diagnosis: PTCA  After initial evaluation and assessments completed: Virtual Based Care may be provided alone or in conjunction with Phase 2 Cardiac Rehab based on patient barriers.: Yes   Call MD for:  difficulty breathing, headache or visual disturbances   Complete by: As directed    Call MD for:  extreme fatigue   Complete by: As directed    Call MD for:  hives   Complete by: As directed    Call MD for:  persistant dizziness or light-headedness   Complete by: As directed    Call MD for:  persistant nausea and vomiting   Complete by: As directed    Call MD for:  redness, tenderness, or signs of infection (pain, swelling, redness, odor or green/yellow discharge around incision site)   Complete by: As directed    Call MD for:  severe uncontrolled pain   Complete by: As directed    Call MD for:  temperature >100.4   Complete by: As directed    Diet - low sodium  heart healthy   Complete by: As directed    Increase activity slowly   Complete by: As directed       Discharge Medications   Allergies as of 06/05/2019      Reactions   Contrast Media [iodinated Diagnostic Agents] Rash      Medication List    STOP taking these medications   predniSONE 50 MG tablet Commonly known as: DELTASONE   ranolazine 500 MG 12 hr tablet Commonly known as: Ranexa     TAKE these medications   aspirin EC 81 MG tablet Take 81 mg by mouth at bedtime.   atorvastatin 40 MG tablet Commonly known as: LIPITOR TAKE 1 TABLET BY MOUTH ONCE A DAY What changed: when to take this   clopidogrel 75 MG tablet Commonly known as: PLAVIX Take 1 tablet (75 mg total) by mouth daily with breakfast. What changed: when to take this   CoQ10 200 MG Caps Take 200 mg by mouth at bedtime.   COSAMIN DS PO Take 2 tablets by mouth at bedtime.   diphenhydrAMINE 50 MG tablet Commonly known as: BENADRYL Take 1 tablet (50 mg) by mouth prior to leaving home to your procedure with a small sip of water.   escitalopram 10 MG tablet Commonly known as: LEXAPRO Take 10 mg by mouth at bedtime.   ezetimibe 10 MG tablet Commonly known as: ZETIA Take 10 mg by mouth at bedtime.   furosemide 40 MG tablet Commonly known as: LASIX Take 1 tablet (40 mg total) by mouth daily.   isosorbide mononitrate 30 MG 24 hr tablet Commonly known as: IMDUR Take 1 tablet (30 mg total) by mouth daily. What changed: when to take this   magnesium oxide 400 MG tablet Commonly known as: MAG-OX Take 400 mg by mouth at bedtime.   METAMUCIL PO Take 1 Dose by mouth at bedtime.   metoprolol tartrate 25 MG tablet Commonly known as: LOPRESSOR Take 0.5 tablets (12.5 mg total) by mouth 2 (two) times daily.   multivitamin with minerals Tabs tablet Take 1 tablet by mouth at bedtime.   nitroGLYCERIN 0.4 MG SL tablet Commonly known as: NITROSTAT Place 1 tablet (0.4 mg total) under the tongue every 5  (five) minutes as needed for chest pain. Notes to patient: If taking your third dose of nitroglycerin, call 911.   pantoprazole 40 MG tablet Commonly known as: PROTONIX Take 40 mg by mouth at bedtime.   ramipril 5 MG capsule Commonly known  as: ALTACE TAKE 1 CAPSULE BY MOUTH ONCE DAILY What changed: when to take this   vitamin C 1000 MG tablet Take 1,000 mg by mouth at bedtime.        Acute coronary syndrome (MI, NSTEMI, STEMI, etc) this admission?: No.    Outstanding Labs/Studies   Follow-up labs to be performed at follow-up TCM. TCM to be scheduled within 1-2 weeks as above.  Duration of Discharge Encounter   Greater than 30 minutes including physician time.  Signed, Arvil Chaco, PA-C 06/05/2019, 10:03 AM

## 2019-06-05 NOTE — Telephone Encounter (Signed)
-----   Message from Arvil Chaco, PA-C sent at 06/05/2019  9:36 AM EDT ----- Regarding: TCM Hello,  This patient was admitted and seen at [facility] for CAD and LHC.  We are expecting discharge today 06/05/2019.  Can you please call and arrange / schedule for follow-up TCM appointment with Dr. Saunders Revel or APP within the next two weeks?  Thank you!  Signed, Arvil Chaco, PA-C 06/05/2019, 9:37 AM Pager [phone]

## 2019-06-05 NOTE — Telephone Encounter (Signed)
Called [employer] to check the status of PA.   Spoke with sarah.  Judson Roch stated the PA was denied. Patient will need to try and fail atleast 2 preferred Beta Blockers.

## 2019-06-05 NOTE — Progress Notes (Signed)
Cardiovascular and Pulmonary Nurse Navigator Note:    Patient presented to [facility] on 06/04/2019 for outpatient Cardiac Cath due to unstable angina.    LEFT HEART CATH AND CORONARY ANGIOGRAPHY  Conclusion  Conclusions: 1. Stable appearance of the coronary arteries with severe OM 2 disease (suspected in-stent restenosis), as well as mild to moderate disease involving the LAD and RCA. 2. Widely patent mid RCA stent. 3. Normal left ventricular filling pressure. 4. Challenging balloon angioplasty to OM2 with acceptable angiographic result leading to reduction of stenosis from 90%->40%.  Lesion could not be covered with a new stent.  Recommendations: 1. Overnight extended recovery. 2. Wean nitroglycerin infusion (started for angina and elevated blood pressure), as tolerated. 3. Initiate ranolazine and metoprolol for antianginal therapy. 4. Dual antiplatelet therapy with aspirin and clopidogrel for at least 12 months. 5. Aggressive secondary prevention.  Nelva Bush, MD [facility]   EDUCATION:   Patient already enrolled in Cardiac Rehab; completed RN assessment / 6 minute walk / dietitian consultation / and partial exercise session. From Last Cardiac Rehab NOTE:  Patient did not complete his last rehab session due to chest pain. Secure chat was sent to his cardiologist and an appt was scheduled for the patient to be seen by his cardiologist.     Discussed modifiable risk factors including controlling blood pressure, cholesterol, and blood sugar; following heart healthy diet; maintaining healthy weight; exercise; and smoking cessation, if applicable.   ? Discussed cardiac medications including rationale for taking, mechanisms of action, and side effects. Stressed the importance of taking medications as prescribed.  ? Discussed emergency plan for heart attack symptoms. Patient verbalized understanding of need to call 911 and not to drive himself to ER if having cardiac symptoms /  chest pain.    Diet:  Patient has been seen by the Cardiac Rehab Dietitian.    Activity:  Patient works for [employer] and his work hours are all over the place.  He is currently enrolled in Cardiac Rehab here at [facility].    Smoking Cessation - Patient is a FORMER smoker.   ? Patient appreciative of the information.  ? Roanna Epley, RN, BSN, [place]  [facility] Cardiac & Pulmonary Rehab  Cardiovascular & Pulmonary Nurse Navigator  Direct Line: [phone]  Department Phone #: [phone] Fax: [phone]  Email Address: [email or web address]

## 2019-06-05 NOTE — Telephone Encounter (Signed)
Patient was called to schedule TCM appointment. Patient unavailable to schedule at this time, will call back this afternoon.

## 2019-06-05 NOTE — Progress Notes (Signed)
Discharge instructions explained to pt/ verbalized an understanding/ iv and tele removed/ ambulated around nursing station/ tolerated well/ will transport off unit via wheelchair

## 2019-06-06 NOTE — Telephone Encounter (Signed)
Patient contacted regarding discharge from [facility] on Wednesday 06/05/19.  Patient understands to follow up with provider [name], NP on Thursday 06/20/19 at 2:00 pm at the [place] office. Patient understands discharge instructions? Yes Patient understands medications and regiment? Yes Patient understands to bring all medications to this visit? Yes  The patient states he has developed some chest pressure today. He notices this at rest/ with exertion both.  He denies any other symptoms. He rates his discomfort 1/10 at this time.  I did verify with the patient that he has NTG on hand to use if needed and he does confirm this.   He is aware I will forward to the MD/ APP just to make them aware and call him back with any further recommendations.  He voices understanding and is agreeable.

## 2019-06-06 NOTE — Telephone Encounter (Signed)
This has been an ongoing problem.  Only additional thing we could add is ranolazine, which has been denied by his insurance.  We could try submitting another prior authorization, now that he has symptoms refractory to maximal tolerated doses of isosorbide mononitrate and metoprolol.  Otherwise, he could try paying for it out of pocket.  Unfortunately, there are no further interventions options for his CAD at this time.  Nelva Bush, MD [facility] HeartCare Pager: [phone]

## 2019-06-06 NOTE — Telephone Encounter (Signed)
TCM....  Patient is being discharged   They saw Elenor Quinones   They are scheduled to see Angelica Ran on 9/3  They were seen for CAD and LHC  They need to be seen within 2 weeks   Pt not added to wait list   Please call

## 2019-06-07 ENCOUNTER — Telehealth: Payer: Self-pay | Admitting: Internal Medicine

## 2019-06-07 MED ORDER — RANOLAZINE ER 500 MG PO TB12: 500.0000 mg | ORAL_TABLET | Freq: Two times a day (BID) | ORAL | 11 refills | Status: DC

## 2019-06-07 NOTE — Telephone Encounter (Signed)
Please have him start ranolazine 500 mg BID.  Nelva Bush, MD [facility] Heart[facility] Pager: [phone]

## 2019-06-07 NOTE — Telephone Encounter (Signed)
Ranexa 500 mg BID sent to [facility].  Will wait to see if a Prior Auth is needed.   The patient is aware this has been sent in and of Dr. Darnelle Bos recommendations to report to the ER/ call 911 for chest pain that is significantly worse than what he has experienced the last several weeks.  The patient voices understanding and is agreeable.

## 2019-06-07 NOTE — Telephone Encounter (Signed)
If he has significant worsening of his chest pain beyond what he has been experiencing for the last several weeks, he should call 911.  Nelva Bush, MD [facility] HeartCare Pager: [phone]

## 2019-06-07 NOTE — Addendum Note (Signed)
Addended by: Alvis Lemmings C on: 06/07/2019 10:18 AM   Modules accepted: Orders

## 2019-06-07 NOTE — Telephone Encounter (Signed)
MyChart message sent to patient. Per Scheduler, patient's office has info on [place] and where to send his paperwork.  Noted.

## 2019-06-07 NOTE — Telephone Encounter (Signed)
I spoke with the patient and advised him of Dr. Darnelle Bos recommendations. The patient states his discomfort subsided last night and he feels ok this morning. He would like to wait and see how he does prior to pursuing Ranexa. He will call back if he decides he wants to try this.

## 2019-06-07 NOTE — Telephone Encounter (Signed)
Patient calling Wants to make office aware we will be receiving short term disability paperwork His first day back at work would be Tues Aug 18 HR worker will be [employer]: fax number [phone]

## 2019-06-07 NOTE — Telephone Encounter (Signed)
The patient called back and is starting to have more discomfort. He would like to pursue Ranexa.   I advised I will review dosing with Dr. Saunders Revel and we will get this sent in for him to [facility]. He is aware we will work on a Prior Auth if needed as well.   The patient voices understanding and is agreeable.

## 2019-06-12 ENCOUNTER — Telehealth: Payer: Self-pay | Admitting: Internal Medicine

## 2019-06-12 NOTE — Telephone Encounter (Signed)
Received faxes from [place] in regards to disability claim  Placed in interoffice mail and sent to [employer]

## 2019-06-20 ENCOUNTER — Ambulatory Visit: Payer: BC Managed Care – PPO | Admitting: Nurse Practitioner

## 2019-06-21 NOTE — Telephone Encounter (Signed)
Forms received from [employer].  Placed in nurse box for signing.  Note there are 2 forms in this packet

## 2019-06-25 NOTE — Progress Notes (Signed)
Unable to complete nutrition 30-day re-eval cycle due to inconsistent attendence

## 2019-06-25 NOTE — Telephone Encounter (Signed)
Placed in Dr End's basket for completion.  

## 2019-06-25 NOTE — Telephone Encounter (Signed)
Sent completed forms to [employer] via interoffice. 

## 2019-06-25 NOTE — Telephone Encounter (Signed)
Forms completed by Dr End and returned to [employer].

## 2019-06-26 ENCOUNTER — Encounter: Payer: Self-pay | Admitting: *Deleted

## 2019-06-26 ENCOUNTER — Telehealth: Payer: Self-pay | Admitting: *Deleted

## 2019-06-26 DIAGNOSIS — Z955 Presence of coronary angioplasty implant and graft: Secondary | ICD-10-CM

## 2019-06-26 NOTE — Progress Notes (Signed)
Cardiac Individual Treatment Plan  Patient Details  Name: Brandon Henderson MRN: [id number] Date of Birth: 1956/05/18 Referring Provider:     Cardiac Rehab from 05/14/2019 in [facility] Cardiac and Pulmonary Rehab  Referring Provider  End, Harrell Gave MD [facility] Cardiologist: Dr. Ida Rogue      Initial Encounter Date:    Cardiac Rehab from 05/14/2019 in [facility] Cardiac and Pulmonary Rehab  Date  05/14/19      Visit Diagnosis: Status post coronary artery stent placement  Patient's Home Medications on Admission:  Current Outpatient Medications:  .  Ascorbic Acid (VITAMIN C) 1000 MG tablet, Take 1,000 mg by mouth at bedtime. , Disp: , Rfl:  .  aspirin EC 81 MG tablet, Take 81 mg by mouth at bedtime. , Disp: , Rfl:  .  atorvastatin (LIPITOR) 40 MG tablet, TAKE 1 TABLET BY MOUTH ONCE A DAY (Patient taking differently: Take 40 mg by mouth at bedtime. ), Disp: 90 tablet, Rfl: 3 .  clopidogrel (PLAVIX) 75 MG tablet, Take 1 tablet (75 mg total) by mouth daily with breakfast. (Patient taking differently: Take 75 mg by mouth at bedtime. ), Disp: 90 tablet, Rfl: 3 .  Coenzyme Q10 (COQ10) 200 MG CAPS, Take 200 mg by mouth at bedtime., Disp: , Rfl:  .  diphenhydrAMINE (BENADRYL) 50 MG tablet, Take 1 tablet (50 mg) by mouth prior to leaving home to your procedure with a small sip of water., Disp: 1 tablet, Rfl: 0 .  escitalopram (LEXAPRO) 10 MG tablet, Take 10 mg by mouth at bedtime. , Disp: , Rfl:  .  ezetimibe (ZETIA) 10 MG tablet, Take 10 mg by mouth at bedtime. , Disp: , Rfl:  .  furosemide (LASIX) 40 MG tablet, Take 1 tablet (40 mg total) by mouth daily., Disp: 30 tablet, Rfl: 3 .  Glucosamine-Chondroitin (COSAMIN DS PO), Take 2 tablets by mouth at bedtime. , Disp: , Rfl:  .  isosorbide mononitrate (IMDUR) 30 MG 24 hr tablet, Take 1 tablet (30 mg total) by mouth daily. (Patient taking differently: Take 30 mg by mouth at bedtime. ), Disp: 90 tablet, Rfl: 3 .  magnesium oxide (MAG-OX) 400 MG  tablet, Take 400 mg by mouth at bedtime., Disp: , Rfl:  .  metoprolol tartrate (LOPRESSOR) 25 MG tablet, Take 0.5 tablets (12.5 mg total) by mouth 2 (two) times daily., Disp: 90 tablet, Rfl: 3 .  Multiple Vitamin (MULTIVITAMIN WITH MINERALS) TABS tablet, Take 1 tablet by mouth at bedtime., Disp: , Rfl:  .  nitroGLYCERIN (NITROSTAT) 0.4 MG SL tablet, Place 1 tablet (0.4 mg total) under the tongue every 5 (five) minutes as needed for chest pain., Disp: 90 tablet, Rfl: 3 .  pantoprazole (PROTONIX) 40 MG tablet, Take 40 mg by mouth at bedtime. , Disp: , Rfl:  .  Psyllium (METAMUCIL PO), Take 1 Dose by mouth at bedtime., Disp: , Rfl:  .  ramipril (ALTACE) 5 MG capsule, TAKE 1 CAPSULE BY MOUTH ONCE DAILY (Patient taking differently: Take 5 mg by mouth at bedtime. ), Disp: 90 capsule, Rfl: 3 .  ranolazine (RANEXA) 500 MG 12 hr tablet, Take 1 tablet (500 mg total) by mouth 2 (two) times daily., Disp: 60 tablet, Rfl: 11  Past Medical History: Past Medical History:  Diagnosis Date  . Anginal pain ([place])   . Arthritis   . Barrett esophagus   . Carotid arterial disease ([place])    a. 2011 s/p R CEA.  . Coronary artery disease    a. 2000 s/p  PCI/BMS x 2 of OM2 [name], [place]); b. 12/2018 MV: EF 43% (likely 2/2 GI uptake artifact No ischemia. Low risk; c. 03/2019 PCI: LM nl, LAD 61m LCX nl, OM2 90 ISR, RCA 40p, 890m3.5x15 Resolute Onyx DES), RPL2 70. d. 06/04/2019 LHC 1. severe OM2 (suspected in stent restenosis), mild to mod dz LAD & RCA, patent mRCA stent, nl LV pressure, balloon angio to OM2 90p to 40p  . Depression   . Diabetes mellitus without complication [place]  . Diastasis recti   . GERD (gastroesophageal reflux disease)   . History of hiatal hernia   . History of kidney stones   . History of tobacco abuse   . Hyperlipidemia LDL goal <70   . Hypertension   . [place]bid obesity [place]  . Osteoarthritis   . Sleep apnea     Tobacco Use: Social History   Tobacco Use  Smoking Status Former Smoker   Smokeless Tobacco Never Used  Tobacco Comment   quit vaping 10/2018. quit smoking 2016    Labs: Recent Review Flowsheet Data    Labs for ITP Cardiac and Pulmonary Rehab Latest Ref Rng & Units 01/01/2019   Cholestrol 100 - 199 mg/dL 151   LDLCALC 0 - 99 mg/dL 76   HDL >39 mg/dL 44   Trlycerides 0 - 149 mg/dL 154(H)   Hemoglobin A1c 4.8 - 5.6 % 6.2(H)       Exercise Target Goals: Exercise Program Goal: Individual exercise prescription set using results from initial 6 min walk test and THRR while considering  patient's activity barriers and safety.   Exercise Prescription Goal: Initial exercise prescription builds to 30-45 minutes a day of aerobic activity, 2-3 days per week.  Home exercise guidelines will be given to patient during program as part of exercise prescription that the participant will acknowledge.  Activity Barriers & Risk Stratification: Activity Barriers & Cardiac Risk Stratification - 05/14/19 0905      Activity Barriers & Cardiac Risk Stratification   Activity Barriers  Back Problems;Joint Problems;Neck/Spine Problems;Deconditioning;Muscular Weakness;Shortness of Breath;Chest Pain/Angina    Cardiac Risk Stratification  Moderate       6 Minute Walk: 6 Minute Walk    Row Name 05/14/19 0903         6 Minute Walk   Phase  Initial     Distance  1430 feet     Walk Time  6 minutes     # of Rest Breaks  0     MPH  2.71     METS  3.16     RPE  15     Perceived Dyspnea   3     VO2 Peak  11.07     Symptoms  Yes (comment)     Comments  chest pain 5/10 relieved with rest, SOB, R knee pain 4/10     Resting HR  68 bpm     Resting BP  126/70     Resting Oxygen Saturation   98 %     Exercise Oxygen Saturation  during 6 min walk  96 %     Max Ex. HR  113 bpm     Max Ex. BP  176/82     2 Minute Post BP  142/70        Oxygen Initial Assessment:   Oxygen Re-Evaluation:   Oxygen Discharge (Final Oxygen Re-Evaluation):   Initial Exercise  Prescription: Initial Exercise Prescription - 05/14/19 0900      Date of Initial  Exercise RX and Referring Provider   Date  05/14/19    Referring Provider  End, Christopher MD   Primary Cardiologist: Dr. Ida Rogue     Treadmill   MPH  2.5    Grade  0.5    Minutes  15    METs  3.09      NuStep   Level  2    SPM  80    Minutes  15    METs  3      Recumbant Elliptical   Level  1    RPM  50    Minutes  15    METs  3      T5 Nustep   Level  2    SPM  80    Minutes  15    METs  3      Prescription Details   Frequency (times per week)  3    Duration  Progress to 30 minutes of continuous aerobic without signs/symptoms of physical distress      Intensity   THRR 40-80% of Max Heartrate  103-138    Ratings of Perceived Exertion  11-13    Perceived Dyspnea  0-4      Progression   Progression  Continue to progress workloads to maintain intensity without signs/symptoms of physical distress.      Resistance Training   Training Prescription  Yes    Weight  3 lbs    Reps  10-15       Perform Capillary Blood Glucose checks as needed.  Exercise Prescription Changes: Exercise Prescription Changes    Row Name 05/14/19 0900             Response to Exercise   Blood Pressure (Admit)  126/70       Blood Pressure (Exercise)  176/82       Blood Pressure (Exit)  142/70       Heart Rate (Admit)  68 bpm       Heart Rate (Exercise)  113 bpm       Heart Rate (Exit)  79 bpm       Oxygen Saturation (Admit)  98 %       Oxygen Saturation (Exercise)  96 %       Rating of Perceived Exertion (Exercise)  15       Perceived Dyspnea (Exercise)  3       Symptoms  chest pain 5/10 (relieved with rest), SOB, r knee pain 4/10       Comments  walk test results          Exercise Comments:   Exercise Goals and Review: Exercise Goals    Row Name 05/14/19 0909             Exercise Goals   Increase Physical Activity  Yes       Intervention  Provide advice, education, support  and counseling about physical activity/exercise needs.;Develop an individualized exercise prescription for aerobic and resistive training based on initial evaluation findings, risk stratification, comorbidities and participant's personal goals.       Expected Outcomes  Short Term: Attend rehab on a regular basis to increase amount of physical activity.;Long Term: Add in home exercise to make exercise part of routine and to increase amount of physical activity.;Long Term: Exercising regularly at least 3-5 days a week.       Increase Strength and Stamina  Yes       Intervention  Provide advice, education, support  and counseling about physical activity/exercise needs.;Develop an individualized exercise prescription for aerobic and resistive training based on initial evaluation findings, risk stratification, comorbidities and participant's personal goals.       Expected Outcomes  Short Term: Increase workloads from initial exercise prescription for resistance, speed, and METs.;Short Term: Perform resistance training exercises routinely during rehab and add in resistance training at home;Long Term: Improve cardiorespiratory fitness, muscular endurance and strength as measured by increased METs and functional capacity (6MWT)       Able to understand and use rate of perceived exertion (RPE) scale  Yes       Intervention  Provide education and explanation on how to use RPE scale       Expected Outcomes  Short Term: Able to use RPE daily in rehab to express subjective intensity level;Long Term:  Able to use RPE to guide intensity level when exercising independently       Able to understand and use Dyspnea scale  Yes       Intervention  Provide education and explanation on how to use Dyspnea scale       Expected Outcomes  Short Term: Able to use Dyspnea scale daily in rehab to express subjective sense of shortness of breath during exertion;Long Term: Able to use Dyspnea scale to guide intensity level when exercising  independently       Knowledge and understanding of Target Heart Rate Range (THRR)  Yes       Intervention  Provide education and explanation of THRR including how the numbers were predicted and where they are located for reference       Expected Outcomes  Short Term: Able to use daily as guideline for intensity in rehab;Long Term: Able to use THRR to govern intensity when exercising independently;Short Term: Able to state/look up THRR       Able to check pulse independently  Yes       Intervention  Provide education and demonstration on how to check pulse in carotid and radial arteries.;Review the importance of being able to check your own pulse for safety during independent exercise       Expected Outcomes  Short Term: Able to explain why pulse checking is important during independent exercise;Long Term: Able to check pulse independently and accurately       Understanding of Exercise Prescription  Yes       Intervention  Provide education, explanation, and written materials on patient's individual exercise prescription       Expected Outcomes  Short Term: Able to explain program exercise prescription;Long Term: Able to explain home exercise prescription to exercise independently          Exercise Goals Re-Evaluation :   Discharge Exercise Prescription (Final Exercise Prescription Changes): Exercise Prescription Changes - 05/14/19 0900      Response to Exercise   Blood Pressure (Admit)  126/70    Blood Pressure (Exercise)  176/82    Blood Pressure (Exit)  142/70    Heart Rate (Admit)  68 bpm    Heart Rate (Exercise)  113 bpm    Heart Rate (Exit)  79 bpm    Oxygen Saturation (Admit)  98 %    Oxygen Saturation (Exercise)  96 %    Rating of Perceived Exertion (Exercise)  15    Perceived Dyspnea (Exercise)  3    Symptoms  chest pain 5/10 (relieved with rest), SOB, r knee pain 4/10    Comments  walk test results  Nutrition:  Target Goals: Understanding of nutrition guidelines,  daily intake of sodium <1554m, cholesterol <2014m calories 30% from fat and 7% or less from saturated fats, daily to have 5 or more servings of fruits and vegetables.  Biometrics: Pre Biometrics - 05/14/19 0910      Pre Biometrics   Height  5' 6.5" (1.689 m)    Weight  253 lb 4.8 oz (114.9 kg)    BMI (Calculated)  40.28    Single Leg Stand  30 seconds        Nutrition Therapy Plan and Nutrition Goals: Nutrition Therapy & Goals - 05/14/19 0918      Nutrition Therapy   Diet  Low Na HH diet    Drug/Food Interactions  Statins/Certain Fruits   lipitor   Protein (specify units)  95g    Fiber  30 grams    Whole Grain Foods  3 servings    Saturated Fats  12 max. grams    Fruits and Vegetables  5 servings/day    Sodium  1.5 grams      Personal Nutrition Goals   Nutrition Goal  ST: reduce Na intake to 1500-200048mange LT: increase functional years and extend working years    Comments  Pt reports works at [employer] 5am starting. If 330am will drink veggie juice, if 5am will drink coffee. Pt reports regardless will have B at 7am (instant oatmeal, fruit, small amount of brown sugar). L is multigrain bread with [place] cuts and some vegetable or fruit. D baked/grilled/sauteed ("healthy oil" his wife uses) fish or meat with wedge salad (cheese, olives, balsalmic, iceburg) or other veggies (roasted with the same oil). Pt will have yasso (frozen yogurt pop) for snack and beer (lime salt and lime wedge) or wine (~4x/week). Pt reports knowing what to do but getting back into it. Will go out to eat once per month but will have associated guilt afterwards. Pt once did diet (if high fat, low CHO - if high CHO, low fat) wants to try to move towards that. Pt will use spice mix with salt, lime/lemon, and pepper instead of conventional salt. Pt will sometimes use NoSalt, but doesn't care for it on certain things. RD noted A1c 6.2, fasting BG no higher than 121 (these are prediabetic numbers), inquired  with pt because diagnosis of diabetes in chart, pt reports not having diabetes, he was told he had prediabetes. Pt reports going to sleep at 630-7pm. Pt reports taking water pill, he will try to weight himslef and check his legs more often.      Intervention Plan   Intervention  Prescribe, educate and counsel regarding individualized specific dietary modifications aiming towards targeted core components such as weight, hypertension, lipid management, diabetes, heart failure and other comorbidities.;Nutrition handout(s) given to patient.    Expected Outcomes  Short Term Goal: Understand basic principles of dietary content, such as calories, fat, sodium, cholesterol and nutrients.;Short Term Goal: A plan has been developed with personal nutrition goals set during dietitian appointment.;Long Term Goal: Adherence to prescribed nutrition plan.       Nutrition Assessments: Nutrition Assessments - 05/13/19 0754      MEDFICTS Scores   Pre Score  18       Nutrition Goals Re-Evaluation:   Nutrition Goals Discharge (Final Nutrition Goals Re-Evaluation):   Psychosocial: Target Goals: Acknowledge presence or absence of significant depression and/or stress, maximize coping skills, provide positive support system. Participant is able to verbalize types and ability to use  techniques and skills needed for reducing stress and depression.   Initial Review & Psychosocial Screening: Initial Psych Review & Screening - 05/10/19 0954      Initial Review   Current issues with  Current Stress Concerns    Comments  Clif reports doing well now that he is back to work post Stent. He and his wife have been doing well during the pandemic. He said he feels a less stressed now that he can technically retire any time from his job at the [employer]?  Yes   wife     Barriers   Psychosocial barriers to participate in program  There are no identifiable  barriers or psychosocial needs.      Screening Interventions   Interventions  Encouraged to exercise    Expected Outcomes  Short Term goal: Utilizing psychosocial counselor, staff and physician to assist with identification of specific Stressors or current issues interfering with healing process. Setting desired goal for each stressor or current issue identified.;Long Term Goal: Stressors or current issues are controlled or eliminated.;Short Term goal: Identification and review with participant of any Quality of Life or Depression concerns found by scoring the questionnaire.;Long Term goal: The participant improves quality of Life and PHQ9 Scores as seen by post scores and/or verbalization of changes       Quality of Life Scores:  Quality of Life - 05/13/19 0753      Quality of Life   Select  Quality of Life      Quality of Life Scores   Health/Function Pre  15.93 %    Socioeconomic Pre  16.25 %    Psych/Spiritual Pre  21.21 %    Family Pre  25.9 %    GLOBAL Pre  18.48 %      Scores of 19 and below usually indicate a poorer quality of life in these areas.  A difference of  2-3 points is a clinically meaningful difference.  A difference of 2-3 points in the total score of the Quality of Life Index has been associated with significant improvement in overall quality of life, self-image, physical symptoms, and general health in studies assessing change in quality of life.  PHQ-9: Recent Review Flowsheet Data    Depression screen [facility] 2/9 05/14/2019   Decreased Interest 1   Down, Depressed, Hopeless 0   PHQ - 2 Score 1   Altered sleeping 0   Tired, decreased energy 2   Change in appetite 0   Feeling bad or failure about yourself  0   Trouble concentrating 0   Moving slowly or fidgety/restless 0   Suicidal thoughts 0   PHQ-9 Score 3   Difficult doing work/chores Not difficult at all     Interpretation of Total Score  Total Score Depression Severity:  1-4 = Minimal depression, 5-9 =  Mild depression, 10-14 = Moderate depression, 15-19 = Moderately severe depression, 20-27 = Severe depression   Psychosocial Evaluation and Intervention:   Psychosocial Re-Evaluation:   Psychosocial Discharge (Final Psychosocial Re-Evaluation):   Vocational Rehabilitation: Provide vocational rehab assistance to qualifying candidates.   Vocational Rehab Evaluation & Intervention: Vocational Rehab - 05/10/19 0953      Initial Vocational Rehab Evaluation & Intervention   Assessment shows need for Vocational Rehabilitation  No       Education: Education Goals: Education classes will be provided on a variety of topics geared toward better understanding of heart  health and risk factor modification. Participant will state understanding/return demonstration of topics presented as noted by education test scores.  Learning Barriers/Preferences: Learning Barriers/Preferences - 05/10/19 0953      Learning Barriers/Preferences   Learning Barriers  None    Learning Preferences  None       Education Topics:  AED/CPR: - Group verbal and written instruction with the use of models to demonstrate the basic use of the AED with the basic ABC's of resuscitation.   General Nutrition Guidelines/Fats and Fiber: -Group instruction provided by verbal, written material, models and posters to present the general guidelines for heart healthy nutrition. Gives an explanation and review of dietary fats and fiber.   Controlling Sodium/Reading Food Labels: -Group verbal and written material supporting the discussion of sodium use in heart healthy nutrition. Review and explanation with models, verbal and written materials for utilization of the food label.   Exercise Physiology & General Exercise Guidelines: - Group verbal and written instruction with models to review the exercise physiology of the cardiovascular system and associated critical values. Provides general exercise guidelines with specific  guidelines to those with heart or lung disease.    Aerobic Exercise & Resistance Training: - Gives group verbal and written instruction on the various components of exercise. Focuses on aerobic and resistive training programs and the benefits of this training and how to safely progress through these programs..   Flexibility, Balance, Mind/Body Relaxation: Provides group verbal/written instruction on the benefits of flexibility and balance training, including mind/body exercise modes such as yoga, pilates and tai chi.  Demonstration and skill practice provided.   Stress and Anxiety: - Provides group verbal and written instruction about the health risks of elevated stress and causes of high stress.  Discuss the correlation between heart/lung disease and anxiety and treatment options. Review healthy ways to manage with stress and anxiety.   Depression: - Provides group verbal and written instruction on the correlation between heart/lung disease and depressed mood, treatment options, and the stigmas associated with seeking treatment.   Anatomy & Physiology of the Heart: - Group verbal and written instruction and models provide basic cardiac anatomy and physiology, with the coronary electrical and arterial systems. Review of Valvular disease and Heart Failure   Cardiac Procedures: - Group verbal and written instruction to review commonly prescribed medications for heart disease. Reviews the medication, class of the drug, and side effects. Includes the steps to properly store meds and maintain the prescription regimen. (beta blockers and nitrates)   Cardiac Medications I: - Group verbal and written instruction to review commonly prescribed medications for heart disease. Reviews the medication, class of the drug, and side effects. Includes the steps to properly store meds and maintain the prescription regimen.   Cardiac Medications II: -Group verbal and written instruction to review commonly  prescribed medications for heart disease. Reviews the medication, class of the drug, and side effects. (all other drug classes)    Go Sex-Intimacy & Heart Disease, Get SMART - Goal Setting: - Group verbal and written instruction through game format to discuss heart disease and the return to sexual intimacy. Provides group verbal and written material to discuss and apply goal setting through the application of the S.M.A.R.T. Method.   Other Matters of the Heart: - Provides group verbal, written materials and models to describe Stable Angina and Peripheral Artery. Includes description of the disease process and treatment options available to the cardiac patient.   Exercise & Equipment Safety: - Individual verbal instruction and  demonstration of equipment use and safety with use of the equipment.   Infection Prevention: - Provides verbal and written material to individual with discussion of infection control including proper hand washing and proper equipment cleaning during exercise session.   Falls Prevention: - Provides verbal and written material to individual with discussion of falls prevention and safety.   Diabetes: - Individual verbal and written instruction to review signs/symptoms of diabetes, desired ranges of glucose level fasting, after meals and with exercise. Acknowledge that pre and post exercise glucose checks will be done for 3 sessions at entry of program.   Know Your Numbers and Risk Factors: -Group verbal and written instruction about important numbers in your health.  Discussion of what are risk factors and how they play a role in the disease process.  Review of Cholesterol, Blood Pressure, Diabetes, and BMI and the role they play in your overall health.   Sleep Hygiene: -Provides group verbal and written instruction about how sleep can affect your health.  Define sleep hygiene, discuss sleep cycles and impact of sleep habits. Review good sleep hygiene tips.     Other: -Provides group and verbal instruction on various topics (see comments)   Knowledge Questionnaire Score: Knowledge Questionnaire Score - 05/13/19 0754      Knowledge Questionnaire Score   Pre Score  26/26       Core Components/Risk Factors/Patient Goals at Admission: Personal Goals and Risk Factors at Admission - 05/14/19 0910      Core Components/Risk Factors/Patient Goals on Admission    Weight Management  Weight Loss;Yes;Obesity    Intervention  Weight Management: Develop a combined nutrition and exercise program designed to reach desired caloric intake, while maintaining appropriate intake of nutrient and fiber, sodium and fats, and appropriate energy expenditure required for the weight goal.;Weight Management: Provide education and appropriate resources to help participant work on and attain dietary goals.;Weight Management/Obesity: Establish reasonable short term and long term weight goals.;Obesity: Provide education and appropriate resources to help participant work on and attain dietary goals.    Admit Weight  253 lb 4.8 oz (114.9 kg)    Goal Weight: Short Term  248 lb (112.5 kg)    Goal Weight: Long Term  240 lb (108.9 kg)    Expected Outcomes  Short Term: Continue to assess and modify interventions until short term weight is achieved;Long Term: Adherence to nutrition and physical activity/exercise program aimed toward attainment of established weight goal;Weight Loss: Understanding of general recommendations for a balanced deficit meal plan, which promotes 1-2 lb weight loss per week and includes a negative energy balance of [phone] kcal/d;Understanding recommendations for meals to include 15-35% energy as protein, 25-35% energy from fat, 35-60% energy from carbohydrates, less than 253m of dietary cholesterol, 20-35 gm of total fiber daily;Understanding of distribution of calorie intake throughout the day with the consumption of 4-5 meals/snacks    Hypertension  Yes     Intervention  Provide education on lifestyle modifcations including regular physical activity/exercise, weight management, moderate sodium restriction and increased consumption of fresh fruit, vegetables, and low fat dairy, alcohol moderation, and smoking cessation.;Monitor prescription use compliance.    Expected Outcomes  Short Term: Continued assessment and intervention until BP is < 140/943mHG in hypertensive participants. < 130/8064mG in hypertensive participants with diabetes, heart failure or chronic kidney disease.;Long Term: Maintenance of blood pressure at goal levels.    Lipids  Yes    Intervention  Provide education and support for participant on nutrition & aerobic/resistive  exercise along with prescribed medications to achieve LDL <3m, HDL >429m    Expected Outcomes  Short Term: Participant states understanding of desired cholesterol values and is compliant with medications prescribed. Participant is following exercise prescription and nutrition guidelines.;Long Term: Cholesterol controlled with medications as prescribed, with individualized exercise RX and with personalized nutrition plan. Value goals: LDL < 7051mHDL > 40 mg.       Core Components/Risk Factors/Patient Goals Review:    Core Components/Risk Factors/Patient Goals at Discharge (Final Review):    ITP Comments: ITP Comments    Row Name 05/10/19 0952 05/14/19 0900 05/29/19 0638 06/25/19 0920 06/26/19 0659   ITP Comments  Virtual orientation completed. Diagnosis can be found in [facility] 7/7. EP and RD orientation scheduled for 7/28 8am  6MWT, gym orientation, and RD initial consult completed today.  Initial ITP created and sent to Dr. MarEmily Filbertedical Director.  30 Day Review Completed today. Continue with ITP unless changed by Medical Director review.   Orientation completed . Has not started sessions  Unable to complete nutrition 30-day re-eval cycle due to inconsistent attendence  30 Day review. Continue with ITP  unless directed changes per Medical Director review.    Had another stent   waiting for clearance      Comments:

## 2019-06-26 NOTE — Telephone Encounter (Signed)
Called to check on pt.  He has a new stent and been cleared to return to rehab.  He will restart on Tues 9/15.

## 2019-06-30 DIAGNOSIS — K2270 Barrett's esophagus without dysplasia: Secondary | ICD-10-CM | POA: Insufficient documentation

## 2019-06-30 DIAGNOSIS — K219 Gastro-esophageal reflux disease without esophagitis: Secondary | ICD-10-CM | POA: Insufficient documentation

## 2019-06-30 DIAGNOSIS — N200 Calculus of kidney: Secondary | ICD-10-CM | POA: Insufficient documentation

## 2019-06-30 DIAGNOSIS — I10 Essential (primary) hypertension: Secondary | ICD-10-CM | POA: Insufficient documentation

## 2019-07-02 ENCOUNTER — Encounter: Payer: BC Managed Care – PPO | Attending: Internal Medicine | Admitting: *Deleted

## 2019-07-02 ENCOUNTER — Other Ambulatory Visit: Payer: Self-pay

## 2019-07-02 DIAGNOSIS — Z79899 Other long term (current) drug therapy: Secondary | ICD-10-CM | POA: Diagnosis not present

## 2019-07-02 DIAGNOSIS — E785 Hyperlipidemia, unspecified: Secondary | ICD-10-CM | POA: Insufficient documentation

## 2019-07-02 DIAGNOSIS — Z7982 Long term (current) use of aspirin: Secondary | ICD-10-CM | POA: Diagnosis not present

## 2019-07-02 DIAGNOSIS — I2510 Atherosclerotic heart disease of native coronary artery without angina pectoris: Secondary | ICD-10-CM | POA: Diagnosis not present

## 2019-07-02 DIAGNOSIS — G4730 Sleep apnea, unspecified: Secondary | ICD-10-CM | POA: Diagnosis not present

## 2019-07-02 DIAGNOSIS — M1990 Unspecified osteoarthritis, unspecified site: Secondary | ICD-10-CM | POA: Insufficient documentation

## 2019-07-02 DIAGNOSIS — Z6841 Body Mass Index (BMI) 40.0 and over, adult: Secondary | ICD-10-CM | POA: Diagnosis not present

## 2019-07-02 DIAGNOSIS — F329 Major depressive disorder, single episode, unspecified: Secondary | ICD-10-CM | POA: Diagnosis not present

## 2019-07-02 DIAGNOSIS — Z955 Presence of coronary angioplasty implant and graft: Secondary | ICD-10-CM | POA: Diagnosis present

## 2019-07-02 DIAGNOSIS — Z7902 Long term (current) use of antithrombotics/antiplatelets: Secondary | ICD-10-CM | POA: Diagnosis not present

## 2019-07-02 DIAGNOSIS — E119 Type 2 diabetes mellitus without complications: Secondary | ICD-10-CM | POA: Diagnosis not present

## 2019-07-02 DIAGNOSIS — I10 Essential (primary) hypertension: Secondary | ICD-10-CM | POA: Insufficient documentation

## 2019-07-02 NOTE — Progress Notes (Signed)
Daily Session Note  Patient Details  Name: Brandon Henderson MRN: [id number] Date of Birth: [birth year] Referring Provider:     Cardiac Rehab from 05/14/2019 in [facility] Cardiac and Pulmonary Rehab  Referring Provider  End, Harrell Gave MD [facility] Cardiologist: Dr. Ida Rogue      Encounter Date: 07/02/2019  Check In: Session Check In - 07/02/19 0904      Check-In   Supervising physician immediately available to respond to emergencies  See telemetry face sheet for immediately available ER MD    Location  [facility]-Cardiac & Pulmonary Rehab    Staff Present  Heath Lark, RN, BSN, CCRP;Joseph Foy [place], [place], ACSM CEP, Exercise Physiologist    Virtual Visit  No    Medication changes reported      No    Fall or balance concerns reported     No    Warm-up and Cool-down  Performed on first and last piece of equipment    Resistance Training Performed  Yes    VAD Patient?  No    PAD/SET Patient?  No      Pain Assessment   Currently in Pain?  No/denies          Social History   Tobacco Use  Smoking Status Former Smoker  Smokeless Tobacco Never Used  Tobacco Comment   quit vaping 10/2018. quit smoking 2016    Goals Met:  Exercise tolerated well Personal goals reviewed No report of cardiac concerns or symptoms  Goals Unmet:  Not Applicable  Comments: First full day of exercise!  Patient was oriented to gym and equipment including functions, settings, policies, and procedures.  Patient's individual exercise prescription and treatment plan were reviewed.  All starting workloads were established based on the results of the 6 minute walk test done at initial orientation visit.  The plan for exercise progression was also introduced and progression will be customized based on patient's performance and goals.    Dr. Emily Filbert is Medical Director for [place] and [name].

## 2019-07-04 ENCOUNTER — Other Ambulatory Visit: Payer: Self-pay

## 2019-07-04 DIAGNOSIS — Z955 Presence of coronary angioplasty implant and graft: Secondary | ICD-10-CM | POA: Diagnosis not present

## 2019-07-04 NOTE — Progress Notes (Signed)
Daily Session Note  Patient Details  Name: Brandon Henderson MRN: [id number] Date of Birth: [birth year] Referring Provider:     Cardiac Rehab from 05/14/2019 in [facility] Cardiac and Pulmonary Rehab  Referring Provider  End, Harrell Gave MD [facility] Cardiologist: Dr. Ida Rogue      Encounter Date: 07/04/2019  Check In: Session Check In - 07/04/19 0909      Check-In   Supervising physician immediately available to respond to emergencies  See telemetry face sheet for immediately available ER MD    Location  [facility]-Cardiac & Pulmonary Rehab    Staff Present  Vida Rigger RN, Vickki Hearing, BA, ACSM CEP, Exercise Physiologist;Jessica Luan [place], MA, RCEP, CCRP, CCET;Jeanna Durrell BS, Exercise Physiologist    Virtual Visit  No    Medication changes reported      No    Fall or balance concerns reported     No    Warm-up and Cool-down  Performed on first and last piece of equipment    Resistance Training Performed  Yes    VAD Patient?  No    PAD/SET Patient?  No      Pain Assessment   Currently in Pain?  No/denies    Multiple Pain Sites  No          Social History   Tobacco Use  Smoking Status Former Smoker  Smokeless Tobacco Never Used  Tobacco Comment   quit vaping 10/2018. quit smoking 2016    Goals Met:  Exercise tolerated well No report of cardiac concerns or symptoms Strength training completed today  Goals Unmet:  Not Applicable  Comments: Pt able to follow exercise prescription today without complaint.  Will continue to monitor for progression.   Dr. Emily Filbert is Medical Director for [place] and [name].

## 2019-07-05 ENCOUNTER — Ambulatory Visit: Payer: BC Managed Care – PPO

## 2019-07-09 ENCOUNTER — Encounter: Payer: BC Managed Care – PPO | Admitting: *Deleted

## 2019-07-09 ENCOUNTER — Other Ambulatory Visit: Payer: Self-pay

## 2019-07-09 DIAGNOSIS — Z955 Presence of coronary angioplasty implant and graft: Secondary | ICD-10-CM

## 2019-07-09 NOTE — Progress Notes (Signed)
Daily Session Note  Patient Details  Name: Brandon Henderson MRN: [id number] Date of Birth: 22-Jan-1956 Referring Provider:     Cardiac Rehab from 05/14/2019 in [facility] Cardiac and Pulmonary Rehab  Referring Provider  End, Harrell Gave MD [facility] Cardiologist: Dr. Ida Rogue      Encounter Date: 07/09/2019  Check In: Session Check In - 07/09/19 0911      Check-In   Supervising physician immediately available to respond to emergencies  See telemetry face sheet for immediately available ER MD    Location  [facility]-Cardiac & Pulmonary Rehab    Staff Present  Heath Lark, RN, BSN, CCRP;Joseph Foy [place], [place], ACSM CEP, Exercise Physiologist    Virtual Visit  No    Medication changes reported      No    Fall or balance concerns reported     No    Warm-up and Cool-down  Performed on first and last piece of equipment    Resistance Training Performed  Yes    VAD Patient?  No    PAD/SET Patient?  No      Pain Assessment   Currently in Pain?  No/denies          Social History   Tobacco Use  Smoking Status Former Smoker  Smokeless Tobacco Never Used  Tobacco Comment   quit vaping 10/2018. quit smoking 2016    Goals Met:  Independence with exercise equipment Exercise tolerated well No report of cardiac concerns or symptoms  Goals Unmet:  Not Applicable  Comments: Pt able to follow exercise prescription today without complaint.  Will continue to monitor for progression.    Dr. Emily Filbert is Medical Director for [place] and [name].

## 2019-07-11 ENCOUNTER — Other Ambulatory Visit: Payer: Self-pay

## 2019-07-11 DIAGNOSIS — Z955 Presence of coronary angioplasty implant and graft: Secondary | ICD-10-CM | POA: Diagnosis not present

## 2019-07-11 NOTE — Progress Notes (Signed)
Daily Session Note  Patient Details  Name: Brandon Henderson MRN: [id number] Date of Birth: 10/09/1956 Referring Provider:     Cardiac Rehab from 05/14/2019 in [facility] Cardiac and Pulmonary Rehab  Referring Provider  End, Harrell Gave MD [facility] Cardiologist: Dr. Ida Rogue      Encounter Date: 07/11/2019  Check In:      Social History   Tobacco Use  Smoking Status Former Smoker  Smokeless Tobacco Never Used  Tobacco Comment   quit vaping 10/2018. quit smoking 2016    Goals Met:  Independence with exercise equipment Exercise tolerated well No report of cardiac concerns or symptoms Strength training completed today  Goals Unmet:  Not Applicable  Comments: Pt able to follow exercise prescription today without complaint.  Will continue to monitor for progression.    Dr. Emily Filbert is Medical Director for [place] and [name].

## 2019-07-12 ENCOUNTER — Ambulatory Visit: Payer: BC Managed Care – PPO

## 2019-07-15 ENCOUNTER — Other Ambulatory Visit: Payer: Self-pay

## 2019-07-15 ENCOUNTER — Encounter: Payer: BC Managed Care – PPO | Admitting: *Deleted

## 2019-07-15 DIAGNOSIS — Z955 Presence of coronary angioplasty implant and graft: Secondary | ICD-10-CM | POA: Diagnosis not present

## 2019-07-15 NOTE — Progress Notes (Signed)
Daily Session Note  Patient Details  Name: Brandon Henderson MRN: [id number] Date of Birth: May 20, 1956 Referring Provider:     Cardiac Rehab from 05/14/2019 in [facility] Cardiac and Pulmonary Rehab  Referring Provider  End, Harrell Gave MD [facility] Cardiologist: Dr. Ida Rogue      Encounter Date: 07/15/2019  Check In: Session Check In - 07/15/19 0814      Check-In   Supervising physician immediately available to respond to emergencies  See telemetry face sheet for immediately available ER MD    Location  [facility]-Cardiac & Pulmonary Rehab    Staff Present  Earlean Shawl, BS, ACSM CEP, Exercise Physiologist;Joseph Hood RCP,RRT,BSRT;Carroll Enterkin, RN, BSN-BC, CCRP    Virtual Visit  No    Medication changes reported      No    Fall or balance concerns reported     No    Warm-up and Cool-down  Performed on first and last piece of equipment    Resistance Training Performed  Yes    VAD Patient?  No    PAD/SET Patient?  No      Pain Assessment   Currently in Pain?  No/denies    Multiple Pain Sites  No          Social History   Tobacco Use  Smoking Status Former Smoker  Smokeless Tobacco Never Used  Tobacco Comment   quit vaping 10/2018. quit smoking 2016    Goals Met:  Independence with exercise equipment Exercise tolerated well No report of cardiac concerns or symptoms Strength training completed today  Goals Unmet:  Not Applicable  Comments: Pt able to follow exercise prescription today without complaint.  Will continue to monitor for progression.    Dr. Emily Filbert is Medical Director for [place] and [name].

## 2019-07-17 ENCOUNTER — Encounter: Payer: BC Managed Care – PPO | Admitting: *Deleted

## 2019-07-17 ENCOUNTER — Other Ambulatory Visit: Payer: Self-pay

## 2019-07-17 DIAGNOSIS — Z955 Presence of coronary angioplasty implant and graft: Secondary | ICD-10-CM | POA: Diagnosis not present

## 2019-07-17 NOTE — Progress Notes (Signed)
Daily Session Note  Patient Details  Name: Brandon Henderson MRN: [id number] Date of Birth: 01/18/1956 Referring Provider:     Cardiac Rehab from 05/14/2019 in [facility] Cardiac and Pulmonary Rehab  Referring Provider  End, Harrell Gave MD [facility] Cardiologist: Dr. Ida Rogue      Encounter Date: 07/17/2019  Check In: Session Check In - 07/17/19 0812      Check-In   Supervising physician immediately available to respond to emergencies  See telemetry face sheet for immediately available ER MD    Location  [facility]-Cardiac & Pulmonary Rehab    Staff Present  Darel Hong, RN BSN;Joseph [address] [place], [place], RCEP, CCRP, CCET    Virtual Visit  No    Medication changes reported      No    Fall or balance concerns reported     No    Warm-up and Cool-down  Performed on first and last piece of equipment    Resistance Training Performed  Yes    VAD Patient?  No    PAD/SET Patient?  No      Pain Assessment   Currently in Pain?  No/denies          Social History   Tobacco Use  Smoking Status Former Smoker  Smokeless Tobacco Never Used  Tobacco Comment   quit vaping 10/2018. quit smoking 2016    Goals Met:  Independence with exercise equipment Exercise tolerated well No report of cardiac concerns or symptoms Strength training completed today  Goals Unmet:  Not Applicable  Comments: Pt able to follow exercise prescription today without complaint.  Will continue to monitor for progression.    Dr. Emily Filbert is Medical Director for [place] and [name].

## 2019-07-19 ENCOUNTER — Ambulatory Visit: Payer: BC Managed Care – PPO

## 2019-07-19 ENCOUNTER — Encounter: Payer: BC Managed Care – PPO | Attending: Internal Medicine

## 2019-07-19 DIAGNOSIS — M1990 Unspecified osteoarthritis, unspecified site: Secondary | ICD-10-CM | POA: Insufficient documentation

## 2019-07-19 DIAGNOSIS — Z7902 Long term (current) use of antithrombotics/antiplatelets: Secondary | ICD-10-CM | POA: Insufficient documentation

## 2019-07-19 DIAGNOSIS — G4730 Sleep apnea, unspecified: Secondary | ICD-10-CM | POA: Insufficient documentation

## 2019-07-19 DIAGNOSIS — E119 Type 2 diabetes mellitus without complications: Secondary | ICD-10-CM | POA: Insufficient documentation

## 2019-07-19 DIAGNOSIS — Z6841 Body Mass Index (BMI) 40.0 and over, adult: Secondary | ICD-10-CM | POA: Insufficient documentation

## 2019-07-19 DIAGNOSIS — F329 Major depressive disorder, single episode, unspecified: Secondary | ICD-10-CM | POA: Insufficient documentation

## 2019-07-19 DIAGNOSIS — I2510 Atherosclerotic heart disease of native coronary artery without angina pectoris: Secondary | ICD-10-CM | POA: Insufficient documentation

## 2019-07-19 DIAGNOSIS — E785 Hyperlipidemia, unspecified: Secondary | ICD-10-CM | POA: Insufficient documentation

## 2019-07-19 DIAGNOSIS — Z7982 Long term (current) use of aspirin: Secondary | ICD-10-CM | POA: Insufficient documentation

## 2019-07-19 DIAGNOSIS — Z79899 Other long term (current) drug therapy: Secondary | ICD-10-CM | POA: Insufficient documentation

## 2019-07-19 DIAGNOSIS — Z955 Presence of coronary angioplasty implant and graft: Secondary | ICD-10-CM | POA: Insufficient documentation

## 2019-07-19 DIAGNOSIS — I10 Essential (primary) hypertension: Secondary | ICD-10-CM | POA: Insufficient documentation

## 2019-07-24 ENCOUNTER — Encounter: Payer: Self-pay | Admitting: *Deleted

## 2019-07-24 DIAGNOSIS — Z955 Presence of coronary angioplasty implant and graft: Secondary | ICD-10-CM

## 2019-07-24 NOTE — Progress Notes (Unsigned)
Cardiac Individual Treatment Plan  Patient Details  Name: Brandon Henderson MRN: [id number] Date of Birth: [birth year] Referring Provider:     Cardiac Rehab from 05/14/2019 in [facility] Cardiac and Pulmonary Rehab  Referring Provider  End, Harrell Gave MD [facility] Cardiologist: Dr. Ida Rogue      Initial Encounter Date:    Cardiac Rehab from 05/14/2019 in [facility] Cardiac and Pulmonary Rehab  Date  05/14/19      Visit Diagnosis: Status post coronary artery stent placement  Patient's Home Medications on Admission:  Current Outpatient Medications:  .  Ascorbic Acid (VITAMIN C) 1000 MG tablet, Take 1,000 mg by mouth at bedtime. , Disp: , Rfl:  .  aspirin EC 81 MG tablet, Take 81 mg by mouth at bedtime. , Disp: , Rfl:  .  atorvastatin (LIPITOR) 40 MG tablet, TAKE 1 TABLET BY MOUTH ONCE A DAY (Patient taking differently: Take 40 mg by mouth at bedtime. ), Disp: 90 tablet, Rfl: 3 .  clopidogrel (PLAVIX) 75 MG tablet, Take 1 tablet (75 mg total) by mouth daily with breakfast. (Patient taking differently: Take 75 mg by mouth at bedtime. ), Disp: 90 tablet, Rfl: 3 .  Coenzyme Q10 (COQ10) 200 MG CAPS, Take 200 mg by mouth at bedtime., Disp: , Rfl:  .  diphenhydrAMINE (BENADRYL) 50 MG tablet, Take 1 tablet (50 mg) by mouth prior to leaving home to your procedure with a small sip of water., Disp: 1 tablet, Rfl: 0 .  escitalopram (LEXAPRO) 10 MG tablet, Take 10 mg by mouth at bedtime. , Disp: , Rfl:  .  ezetimibe (ZETIA) 10 MG tablet, Take 10 mg by mouth at bedtime. , Disp: , Rfl:  .  furosemide (LASIX) 40 MG tablet, Take 1 tablet (40 mg total) by mouth daily., Disp: 30 tablet, Rfl: 3 .  Glucosamine-Chondroitin (COSAMIN DS PO), Take 2 tablets by mouth at bedtime. , Disp: , Rfl:  .  isosorbide mononitrate (IMDUR) 30 MG 24 hr tablet, Take 1 tablet (30 mg total) by mouth daily. (Patient taking differently: Take 30 mg by mouth at bedtime. ), Disp: 90 tablet, Rfl: 3 .  magnesium oxide (MAG-OX) 400 MG  tablet, Take 400 mg by mouth at bedtime., Disp: , Rfl:  .  metoprolol tartrate (LOPRESSOR) 25 MG tablet, Take 0.5 tablets (12.5 mg total) by mouth 2 (two) times daily., Disp: 90 tablet, Rfl: 3 .  Multiple Vitamin (MULTIVITAMIN WITH MINERALS) TABS tablet, Take 1 tablet by mouth at bedtime., Disp: , Rfl:  .  nitroGLYCERIN (NITROSTAT) 0.4 MG SL tablet, Place 1 tablet (0.4 mg total) under the tongue every 5 (five) minutes as needed for chest pain., Disp: 90 tablet, Rfl: 3 .  pantoprazole (PROTONIX) 40 MG tablet, Take 40 mg by mouth at bedtime. , Disp: , Rfl:  .  Psyllium (METAMUCIL PO), Take 1 Dose by mouth at bedtime., Disp: , Rfl:  .  ramipril (ALTACE) 5 MG capsule, TAKE 1 CAPSULE BY MOUTH ONCE DAILY (Patient taking differently: Take 5 mg by mouth at bedtime. ), Disp: 90 capsule, Rfl: 3 .  ranolazine (RANEXA) 500 MG 12 hr tablet, Take 1 tablet (500 mg total) by mouth 2 (two) times daily., Disp: 60 tablet, Rfl: 11  Past Medical History: Past Medical History:  Diagnosis Date  . Anginal pain ([place])   . Arthritis   . Barrett esophagus   . Carotid arterial disease ([place])    a. 2011 s/p R CEA.  . Coronary artery disease    a. 2000 s/p  PCI/BMS x 2 of OM2 [name], [place]); b. 12/2018 MV: EF 43% (likely 2/2 GI uptake artifact No ischemia. Low risk; c. 03/2019 PCI: LM nl, LAD 26m LCX nl, OM2 90 ISR, RCA 40p, 876m3.5x15 Resolute Onyx DES), RPL2 70. d. 06/04/2019 LHC 1. severe OM2 (suspected in stent restenosis), mild to mod dz LAD & RCA, patent mRCA stent, nl LV pressure, balloon angio to OM2 90p to 40p  . Depression   . Diabetes mellitus without complication ([place]  . Diastasis recti   . GERD (gastroesophageal reflux disease)   . History of hiatal hernia   . History of kidney stones   . History of tobacco abuse   . Hyperlipidemia LDL goal <70   . Hypertension   . Morbid obesity ([place]  . Osteoarthritis   . Sleep apnea     Tobacco Use: Social History   Tobacco Use  Smoking Status Former Smoker   Smokeless Tobacco Never Used  Tobacco Comment   quit vaping 10/2018. quit smoking 2016    Labs: Recent Review Flowsheet Data    Labs for ITP Cardiac and Pulmonary Rehab Latest Ref Rng & Units 01/01/2019   Cholestrol 100 - 199 mg/dL 151   LDLCALC 0 - 99 mg/dL 76   HDL >39 mg/dL 44   Trlycerides 0 - 149 mg/dL 154(H)   Hemoglobin A1c 4.8 - 5.6 % 6.2(H)       Exercise Target Goals: Exercise Program Goal: Individual exercise prescription set using results from initial 6 min walk test and THRR while considering  patient's activity barriers and safety.   Exercise Prescription Goal: Initial exercise prescription builds to 30-45 minutes a day of aerobic activity, 2-3 days per week.  Home exercise guidelines will be given to patient during program as part of exercise prescription that the participant will acknowledge.  Activity Barriers & Risk Stratification: Activity Barriers & Cardiac Risk Stratification - 05/14/19 0905      Activity Barriers & Cardiac Risk Stratification   Activity Barriers  Back Problems;Joint Problems;Neck/Spine Problems;Deconditioning;Muscular Weakness;Shortness of Breath;Chest Pain/Angina    Cardiac Risk Stratification  Moderate       6 Minute Walk: 6 Minute Walk    Row Name 05/14/19 0903         6 Minute Walk   Phase  Initial     Distance  1430 feet     Walk Time  6 minutes     # of Rest Breaks  0     MPH  2.71     METS  3.16     RPE  15     Perceived Dyspnea   3     VO2 Peak  11.07     Symptoms  Yes (comment)     Comments  chest pain 5/10 relieved with rest, SOB, R knee pain 4/10     Resting HR  68 bpm     Resting BP  126/70     Resting Oxygen Saturation   98 %     Exercise Oxygen Saturation  during 6 min walk  96 %     Max Ex. HR  113 bpm     Max Ex. BP  176/82     2 Minute Post BP  142/70        Oxygen Initial Assessment:   Oxygen Re-Evaluation:   Oxygen Discharge (Final Oxygen Re-Evaluation):   Initial Exercise  Prescription: Initial Exercise Prescription - 05/14/19 0900      Date of Initial  Exercise RX and Referring Provider   Date  05/14/19    Referring Provider  End, Christopher MD   Primary Cardiologist: Dr. Ida Rogue     Treadmill   MPH  2.5    Grade  0.5    Minutes  15    METs  3.09      NuStep   Level  2    SPM  80    Minutes  15    METs  3      Recumbant Elliptical   Level  1    RPM  50    Minutes  15    METs  3      T5 Nustep   Level  2    SPM  80    Minutes  15    METs  3      Prescription Details   Frequency (times per week)  3    Duration  Progress to 30 minutes of continuous aerobic without signs/symptoms of physical distress      Intensity   THRR 40-80% of Max Heartrate  103-138    Ratings of Perceived Exertion  11-13    Perceived Dyspnea  0-4      Progression   Progression  Continue to progress workloads to maintain intensity without signs/symptoms of physical distress.      Resistance Training   Training Prescription  Yes    Weight  3 lbs    Reps  10-15       Perform Capillary Blood Glucose checks as needed.  Exercise Prescription Changes: Exercise Prescription Changes    Row Name 05/14/19 0900 07/17/19 1400           Response to Exercise   Blood Pressure (Admit)  126/70  110/62      Blood Pressure (Exercise)  176/82  124/64      Blood Pressure (Exit)  142/70  108/60      Heart Rate (Admit)  68 bpm  85 bpm      Heart Rate (Exercise)  113 bpm  110 bpm      Heart Rate (Exit)  79 bpm  84 bpm      Oxygen Saturation (Admit)  98 %  -      Oxygen Saturation (Exercise)  96 %  -      Rating of Perceived Exertion (Exercise)  15  11      Perceived Dyspnea (Exercise)  3  -      Symptoms  chest pain 5/10 (relieved with rest), SOB, r knee pain 4/10  none      Comments  walk test results  -      Duration  -  Progress to 30 minutes of  aerobic without signs/symptoms of physical distress      Intensity  -  THRR unchanged        Progression    Progression  -  Continue to progress workloads to maintain intensity without signs/symptoms of physical distress.      Average METs  -  2.55        Resistance Training   Training Prescription  -  Yes      Weight  -  4 lb      Reps  -  10-15        Treadmill   MPH  -  2.5      Grade  -  0      Minutes  -  15  METs  -  2.99        Recumbant Elliptical   Level  -  2      RPM  -  50      Minutes  -  15        T5 Nustep   Level  -  3      SPM  -  80      Minutes  -  15      METs  -  2.2         Exercise Comments: Exercise Comments    Row Name 07/02/19 0907           Exercise Comments  First full day of exercise!  Patient was oriented to gym and equipment including functions, settings, policies, and procedures.  Patient's individual exercise prescription and treatment plan were reviewed.  All starting workloads were established based on the results of the 6 minute walk test done at initial orientation visit.  The plan for exercise progression was also introduced and progression will be customized based on patient's performance and goals.          Exercise Goals and Review: Exercise Goals    Row Name 05/14/19 0909             Exercise Goals   Increase Physical Activity  Yes       Intervention  Provide advice, education, support and counseling about physical activity/exercise needs.;Develop an individualized exercise prescription for aerobic and resistive training based on initial evaluation findings, risk stratification, comorbidities and participant's personal goals.       Expected Outcomes  Short Term: Attend rehab on a regular basis to increase amount of physical activity.;Long Term: Add in home exercise to make exercise part of routine and to increase amount of physical activity.;Long Term: Exercising regularly at least 3-5 days a week.       Increase Strength and Stamina  Yes       Intervention  Provide advice, education, support and counseling about physical  activity/exercise needs.;Develop an individualized exercise prescription for aerobic and resistive training based on initial evaluation findings, risk stratification, comorbidities and participant's personal goals.       Expected Outcomes  Short Term: Increase workloads from initial exercise prescription for resistance, speed, and METs.;Short Term: Perform resistance training exercises routinely during rehab and add in resistance training at home;Long Term: Improve cardiorespiratory fitness, muscular endurance and strength as measured by increased METs and functional capacity (6MWT)       Able to understand and use rate of perceived exertion (RPE) scale  Yes       Intervention  Provide education and explanation on how to use RPE scale       Expected Outcomes  Short Term: Able to use RPE daily in rehab to express subjective intensity level;Long Term:  Able to use RPE to guide intensity level when exercising independently       Able to understand and use Dyspnea scale  Yes       Intervention  Provide education and explanation on how to use Dyspnea scale       Expected Outcomes  Short Term: Able to use Dyspnea scale daily in rehab to express subjective sense of shortness of breath during exertion;Long Term: Able to use Dyspnea scale to guide intensity level when exercising independently       Knowledge and understanding of Target Heart Rate Range (THRR)  Yes       Intervention  Provide education and explanation of THRR including how the numbers were predicted and where they are located for reference       Expected Outcomes  Short Term: Able to use daily as guideline for intensity in rehab;Long Term: Able to use THRR to govern intensity when exercising independently;Short Term: Able to state/look up THRR       Able to check pulse independently  Yes       Intervention  Provide education and demonstration on how to check pulse in carotid and radial arteries.;Review the importance of being able to check your  own pulse for safety during independent exercise       Expected Outcomes  Short Term: Able to explain why pulse checking is important during independent exercise;Long Term: Able to check pulse independently and accurately       Understanding of Exercise Prescription  Yes       Intervention  Provide education, explanation, and written materials on patient's individual exercise prescription       Expected Outcomes  Short Term: Able to explain program exercise prescription;Long Term: Able to explain home exercise prescription to exercise independently          Exercise Goals Re-Evaluation : Exercise Goals Re-Evaluation    Row Name 07/02/19 0908 07/17/19 1439 07/17/19 1446         Exercise Goal Re-Evaluation   Exercise Goals Review  -  Increase Physical Activity;Increase Strength and Stamina;Able to understand and use rate of perceived exertion (RPE) scale;Knowledge and understanding of Target Heart Rate Range (THRR);Able to check pulse independently;Understanding of Exercise Prescription  -     Comments  Reviewed RPE scale, THR and program prescription with pt today.  Pt voiced understanding and was given a copy of goals to take home.  Pt has been consistent two days per week since returning to [facility].  -     Expected Outcomes  Short: Use RPE daily to regulate intensity. Long: Follow program prescription in THR.  -  Short - continue to attend consistently Long - improve stamina        Discharge Exercise Prescription (Final Exercise Prescription Changes): Exercise Prescription Changes - 07/17/19 1400      Response to Exercise   Blood Pressure (Admit)  110/62    Blood Pressure (Exercise)  124/64    Blood Pressure (Exit)  108/60    Heart Rate (Admit)  85 bpm    Heart Rate (Exercise)  110 bpm    Heart Rate (Exit)  84 bpm    Rating of Perceived Exertion (Exercise)  11    Symptoms  none    Duration  Progress to 30 minutes of  aerobic without signs/symptoms of physical distress    Intensity  THRR  unchanged      Progression   Progression  Continue to progress workloads to maintain intensity without signs/symptoms of physical distress.    Average METs  2.55      Resistance Training   Training Prescription  Yes    Weight  4 lb    Reps  10-15      Treadmill   MPH  2.5    Grade  0    Minutes  15    METs  2.99      Recumbant Elliptical   Level  2    RPM  50    Minutes  15      T5 Nustep   Level  3    SPM  80  Minutes  15    METs  2.2       Nutrition:  Target Goals: Understanding of nutrition guidelines, daily intake of sodium <1581m, cholesterol <2040m calories 30% from fat and 7% or less from saturated fats, daily to have 5 or more servings of fruits and vegetables.  Biometrics: Pre Biometrics - 05/14/19 0910      Pre Biometrics   Height  5' 6.5" (1.689 m)    Weight  253 lb 4.8 oz (114.9 kg)    BMI (Calculated)  40.28    Single Leg Stand  30 seconds        Nutrition Therapy Plan and Nutrition Goals: Nutrition Therapy & Goals - 05/14/19 0918      Nutrition Therapy   Diet  Low Na HH diet    Drug/Food Interactions  Statins/Certain Fruits   lipitor   Protein (specify units)  95g    Fiber  30 grams    Whole Grain Foods  3 servings    Saturated Fats  12 max. grams    Fruits and Vegetables  5 servings/day    Sodium  1.5 grams      Personal Nutrition Goals   Nutrition Goal  ST: reduce Na intake to 1500-200021mange LT: increase functional years and extend working years    Comments  Pt reports works at [employer] 5am starting. If 330am will drink veggie juice, if 5am will drink coffee. Pt reports regardless will have B at 7am (instant oatmeal, fruit, small amount of brown sugar). L is multigrain bread with [place] cuts and some vegetable or fruit. D baked/grilled/sauteed ("healthy oil" his wife uses) fish or meat with wedge salad (cheese, olives, balsalmic, iceburg) or other veggies (roasted with the same oil). Pt will have yasso (frozen yogurt pop) for  snack and beer (lime salt and lime wedge) or wine (~4x/week). Pt reports knowing what to do but getting back into it. Will go out to eat once per month but will have associated guilt afterwards. Pt once did diet (if high fat, low CHO - if high CHO, low fat) wants to try to move towards that. Pt will use spice mix with salt, lime/lemon, and pepper instead of conventional salt. Pt will sometimes use NoSalt, but doesn't care for it on certain things. RD noted A1c 6.2, fasting BG no higher than 121 (these are prediabetic numbers), inquired with pt because diagnosis of diabetes in chart, pt reports not having diabetes, he was told he had prediabetes. Pt reports going to sleep at 630-7pm. Pt reports taking water pill, he will try to weight himslef and check his legs more often.      Intervention Plan   Intervention  Prescribe, educate and counsel regarding individualized specific dietary modifications aiming towards targeted core components such as weight, hypertension, lipid management, diabetes, heart failure and other comorbidities.;Nutrition handout(s) given to patient.    Expected Outcomes  Short Term Goal: Understand basic principles of dietary content, such as calories, fat, sodium, cholesterol and nutrients.;Short Term Goal: A plan has been developed with personal nutrition goals set during dietitian appointment.;Long Term Goal: Adherence to prescribed nutrition plan.       Nutrition Assessments: Nutrition Assessments - 05/13/19 0754      MEDFICTS Scores   Pre Score  18       Nutrition Goals Re-Evaluation: Nutrition Goals Re-Evaluation    RowBayardme 07/04/19 0932             Goals   Current  Weight  262 lb 3.2 oz (118.9 kg)       Nutrition Goal  ST: continue to be mindful and reduce Na intake to 1500-2034m range LT: increase functional years and extend working years, pt wants to lost weight for sleep apnea       Comment  beer salt (lime flavored salt) --> lime. low to no salt V8 + salt -->  V* w/o salt. No salt shaker at dinner, [place] (lower in salt).       Expected Outcome  ST: continue to be mindful and reduce Na intake to 1500-20049mrange LT: increase functional years and extend working years, pt wants to lost weight for sleep apnea          Nutrition Goals Discharge (Final Nutrition Goals Re-Evaluation): Nutrition Goals Re-Evaluation - 07/04/19 0932      Goals   Current Weight  262 lb 3.2 oz (118.9 kg)    Nutrition Goal  ST: continue to be mindful and reduce Na intake to 1500-200048mange LT: increase functional years and extend working years, pt wants to lost weight for sleep apnea    Comment  beer salt (lime flavored salt) --> lime. low to no salt V8 + salt --> V* w/o salt. No salt shaker at dinner, [place] (lower in salt).    Expected Outcome  ST: continue to be mindful and reduce Na intake to 1500-2000m72mnge LT: increase functional years and extend working years, pt wants to lost weight for sleep apnea       Psychosocial: Target Goals: Acknowledge presence or absence of significant depression and/or stress, maximize coping skills, provide positive support system. Participant is able to verbalize types and ability to use techniques and skills needed for reducing stress and depression.   Initial Review & Psychosocial Screening: Initial Psych Review & Screening - 05/10/19 0954      Initial Review   Current issues with  Current Stress Concerns    Comments  RobeD[place]orts doing well now that he is back to work post Stent. He and his wife have been doing well during the pandemic. He said he feels a less stressed now that he can technically retire any time from his job at the [employer]   wife     Barriers   Psychosocial barriers to participate in program  There are no identifiable barriers or psychosocial needs.      Screening Interventions   Interventions  Encouraged to exercise     Expected Outcomes  Short Term goal: Utilizing psychosocial counselor, staff and physician to assist with identification of specific Stressors or current issues interfering with healing process. Setting desired goal for each stressor or current issue identified.;Long Term Goal: Stressors or current issues are controlled or eliminated.;Short Term goal: Identification and review with participant of any Quality of Life or Depression concerns found by scoring the questionnaire.;Long Term goal: The participant improves quality of Life and PHQ9 Scores as seen by post scores and/or verbalization of changes       Quality of Life Scores:  Quality of Life - 05/13/19 0753      Quality of Life   Select  Quality of Life      Quality of Life Scores   Health/Function Pre  15.93 %    Socioeconomic Pre  16.25 %    Psych/Spiritual Pre  21.21 %    Family Pre  25.9 %  GLOBAL Pre  18.48 %      Scores of 19 and below usually indicate a poorer quality of life in these areas.  A difference of  2-3 points is a clinically meaningful difference.  A difference of 2-3 points in the total score of the Quality of Life Index has been associated with significant improvement in overall quality of life, self-image, physical symptoms, and general health in studies assessing change in quality of life.  PHQ-9: Recent Review Flowsheet Data    Depression screen [facility] 2/9 05/14/2019   Decreased Interest 1   Down, Depressed, Hopeless 0   PHQ - 2 Score 1   Altered sleeping 0   Tired, decreased energy 2   Change in appetite 0   Feeling bad or failure about yourself  0   Trouble concentrating 0   Moving slowly or fidgety/restless 0   Suicidal thoughts 0   PHQ-9 Score 3   Difficult doing work/chores Not difficult at all     Interpretation of Total Score  Total Score Depression Severity:  1-4 = Minimal depression, 5-9 = Mild depression, 10-14 = Moderate depression, 15-19 = Moderately severe depression, 20-27 = Severe  depression   Psychosocial Evaluation and Intervention:   Psychosocial Re-Evaluation: Psychosocial Re-Evaluation    [place] Name 07/11/19 0924             Psychosocial Re-Evaluation   Current issues with  Current Sleep Concerns;Current Stress Concerns       Comments  Brandon Henderson is not sleeping well. He has an appointment in November for sleep study/CPAP.  He reports no other stress at this time       Expected Outcomes  Short - follow through with sleep study Long - be able to sleep well       Interventions  Encouraged to attend Cardiac Rehabilitation for the exercise          Psychosocial Discharge (Final Psychosocial Re-Evaluation): Psychosocial Re-Evaluation - 07/11/19 0924      Psychosocial Re-Evaluation   Current issues with  Current Sleep Concerns;Current Stress Concerns    Comments  Brandon Henderson is not sleeping well. He has an appointment in November for sleep study/CPAP.  He reports no other stress at this time    Expected Outcomes  Short - follow through with sleep study Long - be able to sleep well    Interventions  Encouraged to attend Cardiac Rehabilitation for the exercise       Vocational Rehabilitation: Provide vocational rehab assistance to qualifying candidates.   Vocational Rehab Evaluation & Intervention: Vocational Rehab - 05/10/19 0953      Initial Vocational Rehab Evaluation & Intervention   Assessment shows need for Vocational Rehabilitation  No       Education: Education Goals: Education classes will be provided on a variety of topics geared toward better understanding of heart health and risk factor modification. Participant will state understanding/return demonstration of topics presented as noted by education test scores.  Learning Barriers/Preferences: Learning Barriers/Preferences - 05/10/19 0953      Learning Barriers/Preferences   Learning Barriers  None    Learning Preferences  None       Education Topics:  AED/CPR: - Group verbal and written  instruction with the use of models to demonstrate the basic use of the AED with the basic ABC's of resuscitation.   General Nutrition Guidelines/Fats and Fiber: -Group instruction provided by verbal, written material, models and posters to present the general guidelines for heart healthy nutrition. Gives an  explanation and review of dietary fats and fiber.   Controlling Sodium/Reading Food Labels: -Group verbal and written material supporting the discussion of sodium use in heart healthy nutrition. Review and explanation with models, verbal and written materials for utilization of the food label.   Exercise Physiology & General Exercise Guidelines: - Group verbal and written instruction with models to review the exercise physiology of the cardiovascular system and associated critical values. Provides general exercise guidelines with specific guidelines to those with heart or lung disease.    Aerobic Exercise & Resistance Training: - Gives group verbal and written instruction on the various components of exercise. Focuses on aerobic and resistive training programs and the benefits of this training and how to safely progress through these programs..   Flexibility, Balance, Mind/Body Relaxation: Provides group verbal/written instruction on the benefits of flexibility and balance training, including mind/body exercise modes such as yoga, pilates and tai chi.  Demonstration and skill practice provided.   Stress and Anxiety: - Provides group verbal and written instruction about the health risks of elevated stress and causes of high stress.  Discuss the correlation between heart/lung disease and anxiety and treatment options. Review healthy ways to manage with stress and anxiety.   Depression: - Provides group verbal and written instruction on the correlation between heart/lung disease and depressed mood, treatment options, and the stigmas associated with seeking treatment.   Anatomy &  Physiology of the Heart: - Group verbal and written instruction and models provide basic cardiac anatomy and physiology, with the coronary electrical and arterial systems. Review of Valvular disease and Heart Failure   Cardiac Procedures: - Group verbal and written instruction to review commonly prescribed medications for heart disease. Reviews the medication, class of the drug, and side effects. Includes the steps to properly store meds and maintain the prescription regimen. (beta blockers and nitrates)   Cardiac Medications I: - Group verbal and written instruction to review commonly prescribed medications for heart disease. Reviews the medication, class of the drug, and side effects. Includes the steps to properly store meds and maintain the prescription regimen.   Cardiac Medications II: -Group verbal and written instruction to review commonly prescribed medications for heart disease. Reviews the medication, class of the drug, and side effects. (all other drug classes)    Go Sex-Intimacy & Heart Disease, Get SMART - Goal Setting: - Group verbal and written instruction through game format to discuss heart disease and the return to sexual intimacy. Provides group verbal and written material to discuss and apply goal setting through the application of the S.M.A.R.T. Method.   Other Matters of the Heart: - Provides group verbal, written materials and models to describe Stable Angina and Peripheral Artery. Includes description of the disease process and treatment options available to the cardiac patient.   Exercise & Equipment Safety: - Individual verbal instruction and demonstration of equipment use and safety with use of the equipment.   Infection Prevention: - Provides verbal and written material to individual with discussion of infection control including proper hand washing and proper equipment cleaning during exercise session.   Falls Prevention: - Provides verbal and written  material to individual with discussion of falls prevention and safety.   Diabetes: - Individual verbal and written instruction to review signs/symptoms of diabetes, desired ranges of glucose level fasting, after meals and with exercise. Acknowledge that pre and post exercise glucose checks will be done for 3 sessions at entry of program.   Know Your Numbers and Risk Factors: -Group  verbal and written instruction about important numbers in your health.  Discussion of what are risk factors and how they play a role in the disease process.  Review of Cholesterol, Blood Pressure, Diabetes, and BMI and the role they play in your overall health.   Sleep Hygiene: -Provides group verbal and written instruction about how sleep can affect your health.  Define sleep hygiene, discuss sleep cycles and impact of sleep habits. Review good sleep hygiene tips.    Other: -Provides group and verbal instruction on various topics (see comments)   Knowledge Questionnaire Score: Knowledge Questionnaire Score - 05/13/19 0754      Knowledge Questionnaire Score   Pre Score  26/26       Core Components/Risk Factors/Patient Goals at Admission: Personal Goals and Risk Factors at Admission - 05/14/19 0910      Core Components/Risk Factors/Patient Goals on Admission    Weight Management  Weight Loss;Yes;Obesity    Intervention  Weight Management: Develop a combined nutrition and exercise program designed to reach desired caloric intake, while maintaining appropriate intake of nutrient and fiber, sodium and fats, and appropriate energy expenditure required for the weight goal.;Weight Management: Provide education and appropriate resources to help participant work on and attain dietary goals.;Weight Management/Obesity: Establish reasonable short term and long term weight goals.;Obesity: Provide education and appropriate resources to help participant work on and attain dietary goals.    Admit Weight  253 lb 4.8 oz  (114.9 kg)    Goal Weight: Short Term  248 lb (112.5 kg)    Goal Weight: Long Term  240 lb (108.9 kg)    Expected Outcomes  Short Term: Continue to assess and modify interventions until short term weight is achieved;Long Term: Adherence to nutrition and physical activity/exercise program aimed toward attainment of established weight goal;Weight Loss: Understanding of general recommendations for a balanced deficit meal plan, which promotes 1-2 lb weight loss per week and includes a negative energy balance of [phone] kcal/d;Understanding recommendations for meals to include 15-35% energy as protein, 25-35% energy from fat, 35-60% energy from carbohydrates, less than 250m of dietary cholesterol, 20-35 gm of total fiber daily;Understanding of distribution of calorie intake throughout the day with the consumption of 4-5 meals/snacks    Hypertension  Yes    Intervention  Provide education on lifestyle modifcations including regular physical activity/exercise, weight management, moderate sodium restriction and increased consumption of fresh fruit, vegetables, and low fat dairy, alcohol moderation, and smoking cessation.;Monitor prescription use compliance.    Expected Outcomes  Short Term: Continued assessment and intervention until BP is < 140/957mHG in hypertensive participants. < 130/8041mG in hypertensive participants with diabetes, heart failure or chronic kidney disease.;Long Term: Maintenance of blood pressure at goal levels.    Lipids  Yes    Intervention  Provide education and support for participant on nutrition & aerobic/resistive exercise along with prescribed medications to achieve LDL <37m73mDL >40mg42m Expected Outcomes  Short Term: Participant states understanding of desired cholesterol values and is compliant with medications prescribed. Participant is following exercise prescription and nutrition guidelines.;Long Term: Cholesterol controlled with medications as prescribed, with  individualized exercise RX and with personalized nutrition plan. Value goals: LDL < 37mg,31m > 40 mg.       Core Components/Risk Factors/Patient Goals Review:  Goals and Risk Factor Review    Row Name 07/11/19 0922  1308       Core Components/Risk Factors/Patient Goals Review   Personal Goals  Review  Weight Management/Obesity;Hypertension;Lipids       Review  Brandon Henderson is taking meds as directed.  He is walking on days not at [facility].  He is trying to knock out sugar and reduce carbs/processed foods.  His wife is on board and does most of the cooking.       Expected Outcomes  Short - continue walking/ making dietary changes Long - maintain heart healthy habits          Core Components/Risk Factors/Patient Goals at Discharge (Final Review):  Goals and Risk Factor Review - 07/11/19 0922      Core Components/Risk Factors/Patient Goals Review   Personal Goals Review  Weight Management/Obesity;Hypertension;Lipids    Review  Brandon Henderson is taking meds as directed.  He is walking on days not at [facility]  He is trying to knock out sugar and reduce carbs/processed foods.  His wife is on board and does most of the cooking.    Expected Outcomes  Short - continue walking/ making dietary changes Long - maintain heart healthy habits       ITP Comments: ITP Comments    Row Name 05/10/19 0952 05/14/19 0900 05/29/19 0638 06/25/19 0920 06/26/19 0659   ITP Comments  Virtual orientation completed. Diagnosis can be found in [facility] 7/7. EP and RD orientation scheduled for 7/28 8am  6MWT, gym orientation, and RD initial consult completed today.  Initial ITP created and sent to Dr. Emily Filbert, Medical Director.  30 Day Review Completed today. Continue with ITP unless changed by Medical Director review.   Orientation completed . Has not started sessions  Unable to complete nutrition 30-day re-eval cycle due to inconsistent attendence  30 Day review. Continue with ITP unless directed changes per Medical Director review.    Had  another stent   waiting for clearance   Row Name 06/26/19 1402 07/02/19 0908 07/24/19 1050       ITP Comments  Called to check on pt.  He has a new stent and been cleared to return to rehab.  He will restart on Tues 9/15.  First full day of exercise!  Patient was oriented to gym and equipment including functions, settings, policies, and procedures.  Patient's individual exercise prescription and treatment plan were reviewed.  All starting workloads were established based on the results of the 6 minute walk test done at initial orientation visit.  The plan for exercise progression was also introduced and progression will be customized based on patient's performance and goals.  30 day review completed. ITP sent to Dr. Emily Filbert, Medical Director of Cardiac and Pulmonary Rehab. Continue with ITP unless changes are made by physician.  Department closed starting 10/2 until further notice by infection prevention and Health at Work teams for [place].        Comments: 30 day review

## 2019-07-26 ENCOUNTER — Ambulatory Visit: Payer: BC Managed Care – PPO

## 2019-07-29 ENCOUNTER — Encounter: Payer: BC Managed Care – PPO | Admitting: *Deleted

## 2019-07-29 ENCOUNTER — Other Ambulatory Visit: Payer: Self-pay

## 2019-07-29 DIAGNOSIS — Z79899 Other long term (current) drug therapy: Secondary | ICD-10-CM | POA: Diagnosis not present

## 2019-07-29 DIAGNOSIS — Z955 Presence of coronary angioplasty implant and graft: Secondary | ICD-10-CM

## 2019-07-29 DIAGNOSIS — G4730 Sleep apnea, unspecified: Secondary | ICD-10-CM | POA: Diagnosis not present

## 2019-07-29 DIAGNOSIS — Z7902 Long term (current) use of antithrombotics/antiplatelets: Secondary | ICD-10-CM | POA: Diagnosis not present

## 2019-07-29 DIAGNOSIS — Z6841 Body Mass Index (BMI) 40.0 and over, adult: Secondary | ICD-10-CM | POA: Diagnosis not present

## 2019-07-29 DIAGNOSIS — Z7982 Long term (current) use of aspirin: Secondary | ICD-10-CM | POA: Diagnosis not present

## 2019-07-29 DIAGNOSIS — E785 Hyperlipidemia, unspecified: Secondary | ICD-10-CM | POA: Diagnosis not present

## 2019-07-29 DIAGNOSIS — E119 Type 2 diabetes mellitus without complications: Secondary | ICD-10-CM | POA: Diagnosis not present

## 2019-07-29 DIAGNOSIS — I10 Essential (primary) hypertension: Secondary | ICD-10-CM | POA: Diagnosis not present

## 2019-07-29 DIAGNOSIS — M1990 Unspecified osteoarthritis, unspecified site: Secondary | ICD-10-CM | POA: Diagnosis not present

## 2019-07-29 DIAGNOSIS — F329 Major depressive disorder, single episode, unspecified: Secondary | ICD-10-CM | POA: Diagnosis not present

## 2019-07-29 DIAGNOSIS — I2510 Atherosclerotic heart disease of native coronary artery without angina pectoris: Secondary | ICD-10-CM | POA: Diagnosis not present

## 2019-07-29 NOTE — Progress Notes (Signed)
Daily Session Note  Patient Details  Name: Brandon Henderson MRN: [id number] Date of Birth: 01/25/1956 Referring Provider:     Cardiac Rehab from 05/14/2019 in [facility] Cardiac and Pulmonary Rehab  Referring Provider  End, Christopher MD [Primary Cardiologist: Dr. Timothy Gollan]      Encounter Date: 07/29/2019  Check In: Session Check In - 07/29/19 0828      Check-In   Supervising physician immediately available to respond to emergencies  See telemetry face sheet for immediately available ER MD    Location  [facility]-Cardiac & Pulmonary Rehab    Staff Present  Susanne Bice, RN, BSN, CCRP;Jeanna Durrell BS, Exercise Physiologist;Joseph Hood RCP,RRT,BSRT    Virtual Visit  No    Medication changes reported      No    Fall or balance concerns reported     No    Warm-up and Cool-down  Performed on first and last piece of equipment    Resistance Training Performed  Yes    VAD Patient?  No    PAD/SET Patient?  No      Pain Assessment   Currently in Pain?  No/denies          Social History   Tobacco Use  Smoking Status Former Smoker  Smokeless Tobacco Never Used  Tobacco Comment   quit vaping 10/2018. quit smoking 2016    Goals Met:  Independence with exercise equipment Exercise tolerated well No report of cardiac concerns or symptoms  Goals Unmet:  Not Applicable  Comments: Pt able to follow exercise prescription today without complaint.  Will continue to monitor for progression.    Dr. Mark Miller is Medical Director for [facility] and [name]. 

## 2019-07-31 ENCOUNTER — Encounter: Payer: BC Managed Care – PPO | Admitting: *Deleted

## 2019-07-31 ENCOUNTER — Other Ambulatory Visit: Payer: Self-pay

## 2019-07-31 DIAGNOSIS — Z955 Presence of coronary angioplasty implant and graft: Secondary | ICD-10-CM

## 2019-07-31 NOTE — Progress Notes (Signed)
Daily Session Note  Patient Details  Name: Kole Hilyard MRN: [id number] Date of Birth: January 13, 1956 Referring Provider:     Cardiac Rehab from 05/14/2019 in [facility] Cardiac and Pulmonary Rehab  Referring Provider  End, Harrell Gave MD [facility] Cardiologist: Dr. Ida Rogue      Encounter Date: 07/31/2019  Check In: Session Check In - 07/31/19 0816      Check-In   Supervising physician immediately available to respond to emergencies  See telemetry face sheet for immediately available ER MD    Location  [facility]    Staff Present  Gerlene Burdock, RN, BSN-BC, CCRP;Regis Wiland [place], MA, RCEP, CCRP, CCET;Joseph Hood RCP,RRT,BSRT    Virtual Visit  No    Medication changes reported      No    Fall or balance concerns reported     No    Warm-up and Cool-down  Performed on first and last piece of equipment    Resistance Training Performed  Yes    VAD Patient?  No    PAD/SET Patient?  No      Pain Assessment   Currently in Pain?  No/denies          Social History   Tobacco Use  Smoking Status Former Smoker  Smokeless Tobacco Never Used  Tobacco Comment   quit vaping 10/2018. quit smoking 2016    Goals Met:  Independence with exercise equipment Exercise tolerated well No report of cardiac concerns or symptoms Strength training completed today  Goals Unmet:  Not Applicable  Comments: Pt able to follow exercise prescription today without complaint.  Will continue to monitor for progression.    Dr. Emily Filbert is Medical Director for [place] and [name].

## 2019-08-02 ENCOUNTER — Other Ambulatory Visit: Payer: Self-pay

## 2019-08-02 ENCOUNTER — Encounter: Payer: BC Managed Care – PPO | Admitting: *Deleted

## 2019-08-02 ENCOUNTER — Ambulatory Visit: Payer: BC Managed Care – PPO

## 2019-08-02 DIAGNOSIS — Z955 Presence of coronary angioplasty implant and graft: Secondary | ICD-10-CM | POA: Diagnosis not present

## 2019-08-02 NOTE — Progress Notes (Signed)
Daily Session Note  Patient Details  Name: Brandon Henderson MRN: [id number] Date of Birth: Apr 14, 1956 Referring Provider:     Cardiac Rehab from 05/14/2019 in [facility] Cardiac and Pulmonary Rehab  Referring Provider  Henderson, Brandon Gave MD [facility] Cardiologist: Dr. Ida Henderson      Encounter Date: 08/02/2019  Check In: Session Check In - 08/02/19 0801      Check-In   Supervising physician immediately available to respond to emergencies  See telemetry face sheet for immediately available ER MD    Location  [facility]Cardiac [facility] Re[facility]    Staff Present  Brandon Lark, RN, BSN, CCRP;Brandon Greenwood, MA, RCEP, CCRP, Brandon Henderson, [place], ACSM CEP, Exercise Physiologist    Virtual Visit  No    Medication changes reported      No    Fall or balance concerns reported     No    Warm-up and Cool-down  Performed on first and last piece of equipment    Resistance Training Performed  Yes    VAD Patient?  No    PAD/SET Patient?  No      Pain Assessment   Currently in Pain?  No/denies          Social History   Tobacco Use  Smoking Status Former Smoker  Smokeless Tobacco Never Used  Tobacco Comment   quit vaping 10/2018. quit smoking 2016    Goals Met:  Independence with exercise equipment Exercise tolerated well No report of cardiac concerns or symptoms  Goals Unmet:  Not Applicable  Comments: Pt able to follow exercise prescription today without complaint.  Will continue to monitor for progression.    Dr. Emily Henderson is Medical Director for [name] and [name].

## 2019-08-06 ENCOUNTER — Other Ambulatory Visit: Payer: Self-pay

## 2019-08-06 ENCOUNTER — Encounter: Payer: BC Managed Care – PPO | Admitting: *Deleted

## 2019-08-06 DIAGNOSIS — Z955 Presence of coronary angioplasty implant and graft: Secondary | ICD-10-CM

## 2019-08-06 NOTE — Progress Notes (Signed)
Daily Session Note  Patient Details  Name: Brandon Henderson MRN: [id number] Date of Birth: Dec 06, 1955 Referring Provider:     Cardiac Rehab from 05/14/2019 in [facility] Cardiac and Pulmonary Rehab  Referring Provider  End, Harrell Gave MD [facility] Cardiologist: Dr. Ida Rogue      Encounter Date: 08/06/2019  Check In: Session Check In - 08/06/19 0836      Check-In   Supervising physician immediately available to respond to emergencies  See telemetry face sheet for immediately available ER MD    Location  [facility]iac & Pulmonary Rehab    Staff Present  Heath Lark, RN, BSN, CCRP;Joseph Foy [place], [place], ACSM CEP, Exercise Physiologist    Virtual Visit  No    Medication changes reported      No    Fall or balance concerns reported     No    Warm-up and Cool-down  Performed on first and last piece of equipment    Resistance Training Performed  Yes    VAD Patient?  No    PAD/SET Patient?  No      Pain Assessment   Currently in Pain?  No/denies          Social History   Tobacco Use  Smoking Status Former Smoker  Smokeless Tobacco Never Used  Tobacco Comment   quit vaping 10/2018. quit smoking 2016    Goals Met:  Independence with exercise equipment Exercise tolerated well No report of cardiac concerns or symptoms  Goals Unmet:  Not Applicable  Comments: Pt able to follow exercise prescription today without complaint.  Will continue to monitor for progression.    Dr. Emily Filbert is Medical Director for [place] and [name].

## 2019-08-09 ENCOUNTER — Ambulatory Visit: Payer: BC Managed Care – PPO

## 2019-08-16 ENCOUNTER — Ambulatory Visit: Payer: BC Managed Care – PPO

## 2019-08-19 ENCOUNTER — Encounter: Payer: BC Managed Care – PPO | Attending: Internal Medicine

## 2019-08-19 DIAGNOSIS — G4730 Sleep apnea, unspecified: Secondary | ICD-10-CM | POA: Insufficient documentation

## 2019-08-19 DIAGNOSIS — Z7982 Long term (current) use of aspirin: Secondary | ICD-10-CM | POA: Insufficient documentation

## 2019-08-19 DIAGNOSIS — I10 Essential (primary) hypertension: Secondary | ICD-10-CM | POA: Insufficient documentation

## 2019-08-19 DIAGNOSIS — M1990 Unspecified osteoarthritis, unspecified site: Secondary | ICD-10-CM | POA: Insufficient documentation

## 2019-08-19 DIAGNOSIS — Z6841 Body Mass Index (BMI) 40.0 and over, adult: Secondary | ICD-10-CM | POA: Insufficient documentation

## 2019-08-19 DIAGNOSIS — Z79899 Other long term (current) drug therapy: Secondary | ICD-10-CM | POA: Insufficient documentation

## 2019-08-19 DIAGNOSIS — Z955 Presence of coronary angioplasty implant and graft: Secondary | ICD-10-CM | POA: Insufficient documentation

## 2019-08-19 DIAGNOSIS — F329 Major depressive disorder, single episode, unspecified: Secondary | ICD-10-CM | POA: Insufficient documentation

## 2019-08-19 DIAGNOSIS — Z7902 Long term (current) use of antithrombotics/antiplatelets: Secondary | ICD-10-CM | POA: Insufficient documentation

## 2019-08-19 DIAGNOSIS — E119 Type 2 diabetes mellitus without complications: Secondary | ICD-10-CM | POA: Insufficient documentation

## 2019-08-19 DIAGNOSIS — E785 Hyperlipidemia, unspecified: Secondary | ICD-10-CM | POA: Insufficient documentation

## 2019-08-19 DIAGNOSIS — I2510 Atherosclerotic heart disease of native coronary artery without angina pectoris: Secondary | ICD-10-CM | POA: Insufficient documentation

## 2019-08-20 DIAGNOSIS — Z955 Presence of coronary angioplasty implant and graft: Secondary | ICD-10-CM

## 2019-08-20 NOTE — Progress Notes (Signed)
Unable to complete nutrition 30-day re-eval cycle due to inconsistent attendence

## 2019-08-21 ENCOUNTER — Encounter: Payer: Self-pay | Admitting: *Deleted

## 2019-08-21 DIAGNOSIS — Z955 Presence of coronary angioplasty implant and graft: Secondary | ICD-10-CM

## 2019-08-21 NOTE — Progress Notes (Signed)
Cardiac Individual Treatment Plan  Patient Details  Name: Brandon Henderson MRN: [id number] Date of Birth: [birth year] Referring Provider:     Cardiac Rehab from 05/14/2019 in [facility] Cardiac and Pulmonary Rehab  Referring Provider  End, Harrell Gave MD [facility] Cardiologist: Dr. Ida Rogue      Initial Encounter Date:    Cardiac Rehab from 05/14/2019 in [facility] Cardiac and Pulmonary Rehab  Date  05/14/19      Visit Diagnosis: Status post coronary artery stent placement  Patient's Home Medications on Admission:  Current Outpatient Medications:  .  Ascorbic Acid (VITAMIN C) 1000 MG tablet, Take 1,000 mg by mouth at bedtime. , Disp: , Rfl:  .  aspirin EC 81 MG tablet, Take 81 mg by mouth at bedtime. , Disp: , Rfl:  .  atorvastatin (LIPITOR) 40 MG tablet, TAKE 1 TABLET BY MOUTH ONCE A DAY (Patient taking differently: Take 40 mg by mouth at bedtime. ), Disp: 90 tablet, Rfl: 3 .  clopidogrel (PLAVIX) 75 MG tablet, Take 1 tablet (75 mg total) by mouth daily with breakfast. (Patient taking differently: Take 75 mg by mouth at bedtime. ), Disp: 90 tablet, Rfl: 3 .  Coenzyme Q10 (COQ10) 200 MG CAPS, Take 200 mg by mouth at bedtime., Disp: , Rfl:  .  diphenhydrAMINE (BENADRYL) 50 MG tablet, Take 1 tablet (50 mg) by mouth prior to leaving home to your procedure with a small sip of water., Disp: 1 tablet, Rfl: 0 .  escitalopram (LEXAPRO) 10 MG tablet, Take 10 mg by mouth at bedtime. , Disp: , Rfl:  .  ezetimibe (ZETIA) 10 MG tablet, Take 10 mg by mouth at bedtime. , Disp: , Rfl:  .  furosemide (LASIX) 40 MG tablet, Take 1 tablet (40 mg total) by mouth daily., Disp: 30 tablet, Rfl: 3 .  Glucosamine-Chondroitin (COSAMIN DS PO), Take 2 tablets by mouth at bedtime. , Disp: , Rfl:  .  isosorbide mononitrate (IMDUR) 30 MG 24 hr tablet, Take 1 tablet (30 mg total) by mouth daily. (Patient taking differently: Take 30 mg by mouth at bedtime. ), Disp: 90 tablet, Rfl: 3 .  magnesium oxide (MAG-OX) 400 MG  tablet, Take 400 mg by mouth at bedtime., Disp: , Rfl:  .  metoprolol tartrate (LOPRESSOR) 25 MG tablet, Take 0.5 tablets (12.5 mg total) by mouth 2 (two) times daily., Disp: 90 tablet, Rfl: 3 .  Multiple Vitamin (MULTIVITAMIN WITH MINERALS) TABS tablet, Take 1 tablet by mouth at bedtime., Disp: , Rfl:  .  nitroGLYCERIN (NITROSTAT) 0.4 MG SL tablet, Place 1 tablet (0.4 mg total) under the tongue every 5 (five) minutes as needed for chest pain., Disp: 90 tablet, Rfl: 3 .  pantoprazole (PROTONIX) 40 MG tablet, Take 40 mg by mouth at bedtime. , Disp: , Rfl:  .  Psyllium (METAMUCIL PO), Take 1 Dose by mouth at bedtime., Disp: , Rfl:  .  ramipril (ALTACE) 5 MG capsule, TAKE 1 CAPSULE BY MOUTH ONCE DAILY (Patient taking differently: Take 5 mg by mouth at bedtime. ), Disp: 90 capsule, Rfl: 3 .  ranolazine (RANEXA) 500 MG 12 hr tablet, Take 1 tablet (500 mg total) by mouth 2 (two) times daily., Disp: 60 tablet, Rfl: 11  Past Medical History: Past Medical History:  Diagnosis Date  . Anginal pain ([place])   . Arthritis   . Barrett esophagus   . Carotid arterial disease ([place])    a. 2011 s/p R CEA.  . Coronary artery disease    a. 2000 s/p  PCI/BMS x 2 of OM2 [name], [place]); b. 12/2018 MV: EF 43% (likely 2/2 GI uptake artifact No ischemia. Low risk; c. 03/2019 PCI: LM nl, LAD 12m LCX nl, OM2 90 ISR, RCA 40p, 883m3.5x15 Resolute Onyx DES), RPL2 70. d. 06/04/2019 LHC 1. severe OM2 (suspected in stent restenosis), mild to mod dz LAD & RCA, patent mRCA stent, nl LV pressure, balloon angio to OM2 90p to 40p  . Depression   . Diabetes mellitus without complication ([place]  . Diastasis recti   . GERD (gastroesophageal reflux disease)   . History of hiatal hernia   . History of kidney stones   . History of tobacco abuse   . Hyperlipidemia LDL goal <70   . Hypertension   . Morbid obesity ([place]  . Osteoarthritis   . Sleep apnea     Tobacco Use: Social History   Tobacco Use  Smoking Status Former Smoker   Smokeless Tobacco Never Used  Tobacco Comment   quit vaping 10/2018. quit smoking 2016    Labs: Recent Review Flowsheet Data    Labs for ITP Cardiac and Pulmonary Rehab Latest Ref Rng & Units 01/01/2019   Cholestrol 100 - 199 mg/dL 151   LDLCALC 0 - 99 mg/dL 76   HDL >39 mg/dL 44   Trlycerides 0 - 149 mg/dL 154(H)   Hemoglobin A1c 4.8 - 5.6 % 6.2(H)       Exercise Target Goals: Exercise Program Goal: Individual exercise prescription set using results from initial 6 min walk test and THRR while considering  patient's activity barriers and safety.   Exercise Prescription Goal: Initial exercise prescription builds to 30-45 minutes a day of aerobic activity, 2-3 days per week.  Home exercise guidelines will be given to patient during program as part of exercise prescription that the participant will acknowledge.  Activity Barriers & Risk Stratification: Activity Barriers & Cardiac Risk Stratification - 05/14/19 0905      Activity Barriers & Cardiac Risk Stratification   Activity Barriers  Back Problems;Joint Problems;Neck/Spine Problems;Deconditioning;Muscular Weakness;Shortness of Breath;Chest Pain/Angina    Cardiac Risk Stratification  Moderate       6 Minute Walk: 6 Minute Walk    Row Name 05/14/19 0903         6 Minute Walk   Phase  Initial     Distance  1430 feet     Walk Time  6 minutes     # of Rest Breaks  0     MPH  2.71     METS  3.16     RPE  15     Perceived Dyspnea   3     VO2 Peak  11.07     Symptoms  Yes (comment)     Comments  chest pain 5/10 relieved with rest, SOB, R knee pain 4/10     Resting HR  68 bpm     Resting BP  126/70     Resting Oxygen Saturation   98 %     Exercise Oxygen Saturation  during 6 min walk  96 %     Max Ex. HR  113 bpm     Max Ex. BP  176/82     2 Minute Post BP  142/70        Oxygen Initial Assessment:   Oxygen Re-Evaluation:   Oxygen Discharge (Final Oxygen Re-Evaluation):   Initial Exercise  Prescription: Initial Exercise Prescription - 05/14/19 0900      Date of Initial  Exercise RX and Referring Provider   Date  05/14/19    Referring Provider  End, Christopher MD   Primary Cardiologist: Dr. Ida Rogue     Treadmill   MPH  2.5    Grade  0.5    Minutes  15    METs  3.09      NuStep   Level  2    SPM  80    Minutes  15    METs  3      Recumbant Elliptical   Level  1    RPM  50    Minutes  15    METs  3      T5 Nustep   Level  2    SPM  80    Minutes  15    METs  3      Prescription Details   Frequency (times per week)  3    Duration  Progress to 30 minutes of continuous aerobic without signs/symptoms of physical distress      Intensity   THRR 40-80% of Max Heartrate  103-138    Ratings of Perceived Exertion  11-13    Perceived Dyspnea  0-4      Progression   Progression  Continue to progress workloads to maintain intensity without signs/symptoms of physical distress.      Resistance Training   Training Prescription  Yes    Weight  3 lbs    Reps  10-15       Perform Capillary Blood Glucose checks as needed.  Exercise Prescription Changes: Exercise Prescription Changes    Row Name 05/14/19 0900 07/17/19 1400 07/30/19 1400 08/14/19 1500       Response to Exercise   Blood Pressure (Admit)  126/70  110/62  122/70  122/58    Blood Pressure (Exercise)  176/82  124/64  130/64  193/68    Blood Pressure (Exit)  142/70  108/60  124/60  110/62    Heart Rate (Admit)  68 bpm  85 bpm  72 bpm  61 bpm    Heart Rate (Exercise)  113 bpm  110 bpm  87 bpm  104 bpm    Heart Rate (Exit)  79 bpm  84 bpm  66 bpm  76 bpm    Oxygen Saturation (Admit)  98 %  -  -  -    Oxygen Saturation (Exercise)  96 %  -  -  -    Rating of Perceived Exertion (Exercise)  15  11  15  14     Perceived Dyspnea (Exercise)  3  -  -  -    Symptoms  chest pain 5/10 (relieved with rest), SOB, r knee pain 4/10  none  none  none    Comments  walk test results  -  -  -    Duration  -   Progress to 30 minutes of  aerobic without signs/symptoms of physical distress  Continue with 30 min of aerobic exercise without signs/symptoms of physical distress.  Continue with 30 min of aerobic exercise without signs/symptoms of physical distress.    Intensity  -  THRR unchanged  THRR unchanged  THRR unchanged      Progression   Progression  -  Continue to progress workloads to maintain intensity without signs/symptoms of physical distress.  Continue to progress workloads to maintain intensity without signs/symptoms of physical distress.  Continue to progress workloads to maintain intensity without signs/symptoms of physical distress.  Average METs  -  2.55  2.62  3.5      Resistance Training   Training Prescription  -  Yes  Yes  Yes    Weight  -  4 lb  4 lbs  4 lbs    Reps  -  10-15  10-15  10-15      Interval Training   Interval Training  -  -  No  No      Treadmill   MPH  -  2.5  2.5  -    Grade  -  0  0  -    Minutes  -  15  15  -    METs  -  2.99  2.99  -      Arm Ergometer   Level  -  -  1  -    Minutes  -  -  15  -    METs  -  -  2.4  -      Recumbant Elliptical   Level  -  2  -  -    RPM  -  50  -  -    Minutes  -  15  -  -      T5 Nustep   Level  -  3  3  -    SPM  -  80  -  -    Minutes  -  15  15  -    METs  -  2.2  2.2  -      Biostep-RELP   Level  -  -  3  5    Minutes  -  -  15  15    METs  -  -  3  4       Exercise Comments: Exercise Comments    Row Name 07/02/19 0907           Exercise Comments  First full day of exercise!  Patient was oriented to gym and equipment including functions, settings, policies, and procedures.  Patient's individual exercise prescription and treatment plan were reviewed.  All starting workloads were established based on the results of the 6 minute walk test done at initial orientation visit.  The plan for exercise progression was also introduced and progression will be customized based on patient's performance and  goals.          Exercise Goals and Review: Exercise Goals    Row Name 05/14/19 0909             Exercise Goals   Increase Physical Activity  Yes       Intervention  Provide advice, education, support and counseling about physical activity/exercise needs.;Develop an individualized exercise prescription for aerobic and resistive training based on initial evaluation findings, risk stratification, comorbidities and participant's personal goals.       Expected Outcomes  Short Term: Attend rehab on a regular basis to increase amount of physical activity.;Long Term: Add in home exercise to make exercise part of routine and to increase amount of physical activity.;Long Term: Exercising regularly at least 3-5 days a week.       Increase Strength and Stamina  Yes       Intervention  Provide advice, education, support and counseling about physical activity/exercise needs.;Develop an individualized exercise prescription for aerobic and resistive training based on initial evaluation findings, risk stratification, comorbidities and participant's personal goals.       Expected Outcomes  Short Term: Increase workloads from initial exercise prescription for resistance, speed, and METs.;Short Term: Perform resistance training exercises routinely during rehab and add in resistance training at home;Long Term: Improve cardiorespiratory fitness, muscular endurance and strength as measured by increased METs and functional capacity (6MWT)       Able to understand and use rate of perceived exertion (RPE) scale  Yes       Intervention  Provide education and explanation on how to use RPE scale       Expected Outcomes  Short Term: Able to use RPE daily in rehab to express subjective intensity level;Long Term:  Able to use RPE to guide intensity level when exercising independently       Able to understand and use Dyspnea scale  Yes       Intervention  Provide education and explanation on how to use Dyspnea scale        Expected Outcomes  Short Term: Able to use Dyspnea scale daily in rehab to express subjective sense of shortness of breath during exertion;Long Term: Able to use Dyspnea scale to guide intensity level when exercising independently       Knowledge and understanding of Target Heart Rate Range (THRR)  Yes       Intervention  Provide education and explanation of THRR including how the numbers were predicted and where they are located for reference       Expected Outcomes  Short Term: Able to use daily as guideline for intensity in rehab;Long Term: Able to use THRR to govern intensity when exercising independently;Short Term: Able to state/look up THRR       Able to check pulse independently  Yes       Intervention  Provide education and demonstration on how to check pulse in carotid and radial arteries.;Review the importance of being able to check your own pulse for safety during independent exercise       Expected Outcomes  Short Term: Able to explain why pulse checking is important during independent exercise;Long Term: Able to check pulse independently and accurately       Understanding of Exercise Prescription  Yes       Intervention  Provide education, explanation, and written materials on patient's individual exercise prescription       Expected Outcomes  Short Term: Able to explain program exercise prescription;Long Term: Able to explain home exercise prescription to exercise independently          Exercise Goals Re-Evaluation : Exercise Goals Re-Evaluation    Row Name 07/02/19 0908 07/17/19 1439 07/17/19 1446 07/29/19 0853 08/14/19 1513     Exercise Goal Re-Evaluation   Exercise Goals Review  -  Increase Physical Activity;Increase Strength and Stamina;Able to understand and use rate of perceived exertion (RPE) scale;Knowledge and understanding of Target Heart Rate Range (THRR);Able to check pulse independently;Understanding of Exercise Prescription  -  Increase Physical Activity;Increase  Strength and Stamina;Understanding of Exercise Prescription  Increase Physical Activity;Increase Strength and Stamina;Understanding of Exercise Prescription   Comments  Reviewed RPE scale, THR and program prescription with pt today.  Pt voiced understanding and was given a copy of goals to take home.  Pt has been consistent two days per week since returning to [place] is doing well in rehab.  He is now attending three days a week.  Over the weekend he has been walking more.  They went to the mountains to hike to see the leaves.  He is feeling better overall.  Himmat is now attending when his work schedule allows.  He has a set time to come to class and attends as able. We are usually able to set up his schedule a week or two in advance.  He is up to level 5 on the BioStep now.  We will continue to monitor his progress.   Expected Outcomes  Short: Use RPE daily to regulate intensity. Long: Follow program prescription in THR.  -  Short - continue to attend consistently Long - improve stamina  Short: Continue to walk on his off days.  Long: Continue to improve stamina.  Short: Attend class when able  Long: Continue to improve stamina.      Discharge Exercise Prescription (Final Exercise Prescription Changes): Exercise Prescription Changes - 08/14/19 1500      Response to Exercise   Blood Pressure (Admit)  122/58    Blood Pressure (Exercise)  193/68    Blood Pressure (Exit)  110/62    Heart Rate (Admit)  61 bpm    Heart Rate (Exercise)  104 bpm    Heart Rate (Exit)  76 bpm    Rating of Perceived Exertion (Exercise)  14    Symptoms  none    Duration  Continue with 30 min of aerobic exercise without signs/symptoms of physical distress.    Intensity  THRR unchanged      Progression   Progression  Continue to progress workloads to maintain intensity without signs/symptoms of physical distress.    Average METs  3.5      Resistance Training   Training Prescription  Yes    Weight  4 lbs     Reps  10-15      Interval Training   Interval Training  No      Biostep-RELP   Level  5    Minutes  15    METs  4       Nutrition:  Target Goals: Understanding of nutrition guidelines, daily intake of sodium <1525m, cholesterol <2023m calories 30% from fat and 7% or less from saturated fats, daily to have 5 or more servings of fruits and vegetables.  Biometrics: Pre Biometrics - 05/14/19 0910      Pre Biometrics   Height  5' 6.5" (1.689 m)    Weight  253 lb 4.8 oz (114.9 kg)    BMI (Calculated)  40.28    Single Leg Stand  30 seconds        Nutrition Therapy Plan and Nutrition Goals: Nutrition Therapy & Goals - 05/14/19 0918      Nutrition Therapy   Diet  Low Na HH diet    Drug/Food Interactions  Statins/Certain Fruits   lipitor   Protein (specify units)  95g    Fiber  30 grams    Whole Grain Foods  3 servings    Saturated Fats  12 max. grams    Fruits and Vegetables  5 servings/day    Sodium  1.5 grams      Personal Nutrition Goals   Nutrition Goal  ST: reduce Na intake to 1500-200065mange LT: increase functional years and extend working years    Comments  Pt reports works at [employer] 5am starting. If 330am will drink veggie juice, if 5am will drink coffee. Pt reports regardless will have B at 7am (instant oatmeal, fruit, small amount of brown sugar). L is multigrain bread with [place] cuts and some vegetable or fruit. D baked/grilled/sauteed ("healthy oil" his wife uses) fish or meat  with wedge salad (cheese, olives, balsalmic, iceburg) or other veggies (roasted with the same oil). Pt will have yasso (frozen yogurt pop) for snack and beer (lime salt and lime wedge) or wine (~4x/week). Pt reports knowing what to do but getting back into it. Will go out to eat once per month but will have associated guilt afterwards. Pt once did diet (if high fat, low CHO - if high CHO, low fat) wants to try to move towards that. Pt will use spice mix with salt, lime/lemon, and  pepper instead of conventional salt. Pt will sometimes use NoSalt, but doesn't care for it on certain things. RD noted A1c 6.2, fasting BG no higher than 121 (these are prediabetic numbers), inquired with pt because diagnosis of diabetes in chart, pt reports not having diabetes, he was told he had prediabetes. Pt reports going to sleep at 630-7pm. Pt reports taking water pill, he will try to weight himslef and check his legs more often.      Intervention Plan   Intervention  Prescribe, educate and counsel regarding individualized specific dietary modifications aiming towards targeted core components such as weight, hypertension, lipid management, diabetes, heart failure and other comorbidities.;Nutrition handout(s) given to patient.    Expected Outcomes  Short Term Goal: Understand basic principles of dietary content, such as calories, fat, sodium, cholesterol and nutrients.;Short Term Goal: A plan has been developed with personal nutrition goals set during dietitian appointment.;Long Term Goal: Adherence to prescribed nutrition plan.       Nutrition Assessments: Nutrition Assessments - 05/13/19 0754      MEDFICTS Scores   Pre Score  18       Nutrition Goals Re-Evaluation: Nutrition Goals Re-Evaluation    Row Name 07/04/19 0932             Goals   Current Weight  262 lb 3.2 oz (118.9 kg)       Nutrition Goal  ST: continue to be mindful and reduce Na intake to 1500-2028m range LT: increase functional years and extend working years, pt wants to lost weight for sleep apnea       Comment  beer salt (lime flavored salt) --> lime. low to no salt V8 + salt --> V* w/o salt. No salt shaker at dinner, [place] (lower in salt).       Expected Outcome  ST: continue to be mindful and reduce Na intake to 1500-20047mrange LT: increase functional years and extend working years, pt wants to lost weight for sleep apnea          Nutrition Goals Discharge (Final Nutrition Goals  Re-Evaluation): Nutrition Goals Re-Evaluation - 07/04/19 0932      Goals   Current Weight  262 lb 3.2 oz (118.9 kg)    Nutrition Goal  ST: continue to be mindful and reduce Na intake to 1500-200056mange LT: increase functional years and extend working years, pt wants to lost weight for sleep apnea    Comment  beer salt (lime flavored salt) --> lime. low to no salt V8 + salt --> V* w/o salt. No salt shaker at dinner, cal[place] Republicend (lower in salt).    Expected Outcome  ST: continue to be mindful and reduce Na intake to 1500-2000m61mnge LT: increase functional years and extend working years, pt wants to lost weight for sleep apnea       Psychosocial: Target Goals: Acknowledge presence or absence of significant depression and/or stress, maximize coping skills, provide positive support system. Participant is  able to verbalize types and ability to use techniques and skills needed for reducing stress and depression.   Initial Review & Psychosocial Screening: Initial Psych Review & Screening - 05/10/19 0954      Initial Review   Current issues with  Current Stress Concerns    Comments  Jimie reports doing well now that he is back to work post Stent. He and his wife have been doing well during the pandemic. He said he feels a less stressed now that he can technically retire any time from his job at the [employer]?  Yes   wife     Barriers   Psychosocial barriers to participate in program  There are no identifiable barriers or psychosocial needs.      Screening Interventions   Interventions  Encouraged to exercise    Expected Outcomes  Short Term goal: Utilizing psychosocial counselor, staff and physician to assist with identification of specific Stressors or current issues interfering with healing process. Setting desired goal for each stressor or current issue identified.;Long Term Goal: Stressors or current issues are controlled  or eliminated.;Short Term goal: Identification and review with participant of any Quality of Life or Depression concerns found by scoring the questionnaire.;Long Term goal: The participant improves quality of Life and PHQ9 Scores as seen by post scores and/or verbalization of changes       Quality of Life Scores:  Quality of Life - 05/13/19 0753      Quality of Life   Select  Quality of Life      Quality of Life Scores   Health/Function Pre  15.93 %    Socioeconomic Pre  16.25 %    Psych/Spiritual Pre  21.21 %    Family Pre  25.9 %    GLOBAL Pre  18.48 %      Scores of 19 and below usually indicate a poorer quality of life in these areas.  A difference of  2-3 points is a clinically meaningful difference.  A difference of 2-3 points in the total score of the Quality of Life Index has been associated with significant improvement in overall quality of life, self-image, physical symptoms, and general health in studies assessing change in quality of life.  PHQ-9: Recent Review Flowsheet Data    Depression screen [facility] 2/9 05/14/2019   Decreased Interest 1   Down, Depressed, Hopeless 0   PHQ - 2 Score 1   Altered sleeping 0   Tired, decreased energy 2   Change in appetite 0   Feeling bad or failure about yourself  0   Trouble concentrating 0   Moving slowly or fidgety/restless 0   Suicidal thoughts 0   PHQ-9 Score 3   Difficult doing work/chores Not difficult at all     Interpretation of Total Score  Total Score Depression Severity:  1-4 = Minimal depression, 5-9 = Mild depression, 10-14 = Moderate depression, 15-19 = Moderately severe depression, 20-27 = Severe depression   Psychosocial Evaluation and Intervention:   Psychosocial Re-Evaluation: Psychosocial Re-Evaluation    Row Name 07/11/19 0924 07/29/19 0855           Psychosocial Re-Evaluation   Current issues with  Current Sleep Concerns;Current Stress Concerns  Current Sleep Concerns;Current Stress Concerns       Comments  Simona Huh is not sleeping well. He has an appointment in November for sleep study/CPAP.  He reports no other stress at  this time  Asier is doing well in rehab.  He is doing well mentally.  He starts back to work on Monday and will have a different schedule.  He may to need have a varing schedule week.   He would like to come into class on his days off.   He has been sleeping well.      Expected Outcomes  Short - follow through with sleep study Long - be able to sleep well  Short: Adjust to being back to work.  Long: Continue to practice self care.      Interventions  Encouraged to attend Cardiac Rehabilitation for the exercise  Encouraged to attend Cardiac Rehabilitation for the exercise      Continue Psychosocial Services   -  Follow up required by staff         Psychosocial Discharge (Final Psychosocial Re-Evaluation): Psychosocial Re-Evaluation - 07/29/19 0855      Psychosocial Re-Evaluation   Current issues with  Current Sleep Concerns;Current Stress Concerns    Comments  Shant is doing well in rehab.  He is doing well mentally.  He starts back to work on Monday and will have a different schedule.  He may to need have a varing schedule week.   He would like to come into class on his days off.   He has been sleeping well.    Expected Outcomes  Short: Adjust to being back to work.  Long: Continue to practice self care.    Interventions  Encouraged to attend Cardiac Rehabilitation for the exercise    Continue Psychosocial Services   Follow up required by staff       Vocational Rehabilitation: Provide vocational rehab assistance to qualifying candidates.   Vocational Rehab Evaluation & Intervention: Vocational Rehab - 05/10/19 0953      Initial Vocational Rehab Evaluation & Intervention   Assessment shows need for Vocational Rehabilitation  No       Education: Education Goals: Education classes will be provided on a variety of topics geared toward better understanding of heart  health and risk factor modification. Participant will state understanding/return demonstration of topics presented as noted by education test scores.  Learning Barriers/Preferences: Learning Barriers/Preferences - 05/10/19 0953      Learning Barriers/Preferences   Learning Barriers  None    Learning Preferences  None       Education Topics:  AED/CPR: - Group verbal and written instruction with the use of models to demonstrate the basic use of the AED with the basic ABC's of resuscitation.   General Nutrition Guidelines/Fats and Fiber: -Group instruction provided by verbal, written material, models and posters to present the general guidelines for heart healthy nutrition. Gives an explanation and review of dietary fats and fiber.   Controlling Sodium/Reading Food Labels: -Group verbal and written material supporting the discussion of sodium use in heart healthy nutrition. Review and explanation with models, verbal and written materials for utilization of the food label.   Exercise Physiology & General Exercise Guidelines: - Group verbal and written instruction with models to review the exercise physiology of the cardiovascular system and associated critical values. Provides general exercise guidelines with specific guidelines to those with heart or lung disease.    Aerobic Exercise & Resistance Training: - Gives group verbal and written instruction on the various components of exercise. Focuses on aerobic and resistive training programs and the benefits of this training and how to safely progress through these programs..   Flexibility, Balance, Mind/Body Relaxation: Provides  group verbal/written instruction on the benefits of flexibility and balance training, including mind/body exercise modes such as yoga, pilates and tai chi.  Demonstration and skill practice provided.   Stress and Anxiety: - Provides group verbal and written instruction about the health risks of elevated  stress and causes of high stress.  Discuss the correlation between heart/lung disease and anxiety and treatment options. Review healthy ways to manage with stress and anxiety.   Depression: - Provides group verbal and written instruction on the correlation between heart/lung disease and depressed mood, treatment options, and the stigmas associated with seeking treatment.   Anatomy & Physiology of the Heart: - Group verbal and written instruction and models provide basic cardiac anatomy and physiology, with the coronary electrical and arterial systems. Review of Valvular disease and Heart Failure   Cardiac Procedures: - Group verbal and written instruction to review commonly prescribed medications for heart disease. Reviews the medication, class of the drug, and side effects. Includes the steps to properly store meds and maintain the prescription regimen. (beta blockers and nitrates)   Cardiac Medications I: - Group verbal and written instruction to review commonly prescribed medications for heart disease. Reviews the medication, class of the drug, and side effects. Includes the steps to properly store meds and maintain the prescription regimen.   Cardiac Medications II: -Group verbal and written instruction to review commonly prescribed medications for heart disease. Reviews the medication, class of the drug, and side effects. (all other drug classes)    Go Sex-Intimacy & Heart Disease, Get SMART - Goal Setting: - Group verbal and written instruction through game format to discuss heart disease and the return to sexual intimacy. Provides group verbal and written material to discuss and apply goal setting through the application of the S.M.A.R.T. Method.   Other Matters of the Heart: - Provides group verbal, written materials and models to describe Stable Angina and Peripheral Artery. Includes description of the disease process and treatment options available to the cardiac  patient.   Exercise & Equipment Safety: - Individual verbal instruction and demonstration of equipment use and safety with use of the equipment.   Infection Prevention: - Provides verbal and written material to individual with discussion of infection control including proper hand washing and proper equipment cleaning during exercise session.   Falls Prevention: - Provides verbal and written material to individual with discussion of falls prevention and safety.   Diabetes: - Individual verbal and written instruction to review signs/symptoms of diabetes, desired ranges of glucose level fasting, after meals and with exercise. Acknowledge that pre and post exercise glucose checks will be done for 3 sessions at entry of program.   Know Your Numbers and Risk Factors: -Group verbal and written instruction about important numbers in your health.  Discussion of what are risk factors and how they play a role in the disease process.  Review of Cholesterol, Blood Pressure, Diabetes, and BMI and the role they play in your overall health.   Sleep Hygiene: -Provides group verbal and written instruction about how sleep can affect your health.  Define sleep hygiene, discuss sleep cycles and impact of sleep habits. Review good sleep hygiene tips.    Other: -Provides group and verbal instruction on various topics (see comments)   Knowledge Questionnaire Score: Knowledge Questionnaire Score - 05/13/19 0754      Knowledge Questionnaire Score   Pre Score  26/26       Core Components/Risk Factors/Patient Goals at Admission: Personal Goals and Risk Factors at  Admission - 05/14/19 0910      Core Components/Risk Factors/Patient Goals on Admission    Weight Management  Weight Loss;Yes;Obesity    Intervention  Weight Management: Develop a combined nutrition and exercise program designed to reach desired caloric intake, while maintaining appropriate intake of nutrient and fiber, sodium and fats, and  appropriate energy expenditure required for the weight goal.;Weight Management: Provide education and appropriate resources to help participant work on and attain dietary goals.;Weight Management/Obesity: Establish reasonable short term and long term weight goals.;Obesity: Provide education and appropriate resources to help participant work on and attain dietary goals.    Admit Weight  253 lb 4.8 oz (114.9 kg)    Goal Weight: Short Term  248 lb (112.5 kg)    Goal Weight: Long Term  240 lb (108.9 kg)    Expected Outcomes  Short Term: Continue to assess and modify interventions until short term weight is achieved;Long Term: Adherence to nutrition and physical activity/exercise program aimed toward attainment of established weight goal;Weight Loss: Understanding of general recommendations for a balanced deficit meal plan, which promotes 1-2 lb weight loss per week and includes a negative energy balance of [phone] kcal/d;Understanding recommendations for meals to include 15-35% energy as protein, 25-35% energy from fat, 35-60% energy from carbohydrates, less than 254m of dietary cholesterol, 20-35 gm of total fiber daily;Understanding of distribution of calorie intake throughout the day with the consumption of 4-5 meals/snacks    Hypertension  Yes    Intervention  Provide education on lifestyle modifcations including regular physical activity/exercise, weight management, moderate sodium restriction and increased consumption of fresh fruit, vegetables, and low fat dairy, alcohol moderation, and smoking cessation.;Monitor prescription use compliance.    Expected Outcomes  Short Term: Continued assessment and intervention until BP is < 140/972mHG in hypertensive participants. < 130/8072mG in hypertensive participants with diabetes, heart failure or chronic kidney disease.;Long Term: Maintenance of blood pressure at goal levels.    Lipids  Yes    Intervention  Provide education and support for participant on  nutrition & aerobic/resistive exercise along with prescribed medications to achieve LDL <47m6mDL >40mg32m Expected Outcomes  Short Term: Participant states understanding of desired cholesterol values and is compliant with medications prescribed. Participant is following exercise prescription and nutrition guidelines.;Long Term: Cholesterol controlled with medications as prescribed, with individualized exercise RX and with personalized nutrition plan. Value goals: LDL < 47mg,36m > 40 mg.       Core Components/Risk Factors/Patient Goals Review:  Goals and Risk Factor Review    Row Name 07/11/19 0922 14098/20 0857           Core Components/Risk Factors/Patient Goals Review   Personal Goals Review  Weight Management/Obesity;Hypertension;Lipids  Weight Management/Obesity;Hypertension;Lipids      Review  DennisSimona Huhking meds as directed.  He is walking on days not at [facility].  [facility] and reduce carbs/processed foods.  His wife is on board and does most of the cooking.  RobertVasilying well in rehab.  His weight is up and down some.  He is working on new eating habits and trying to figure it all out.   He is doing well with his new diet.  He checks his pressures daily here and at home and has been getting good numbers.  Overall, things are going well.      Expected Outcomes  Short - continue walking/ making dietary changes Long - maintain heart healthy habits  Short: Continue to work on diet for weight loss.  Long: Continue to monitor risk factors.         Core Components/Risk Factors/Patient Goals at Discharge (Final Review):  Goals and Risk Factor Review - 07/29/19 0857      Core Components/Risk Factors/Patient Goals Review   Personal Goals Review  Weight Management/Obesity;Hypertension;Lipids    Review  Janzen is doing well in rehab.  His weight is up and down some.  He is working on new eating habits and trying to figure it all out.   He is doing well with his new diet.   He checks his pressures daily here and at home and has been getting good numbers.  Overall, things are going well.    Expected Outcomes  Short: Continue to work on diet for weight loss.  Long: Continue to monitor risk factors.       ITP Comments: ITP Comments    Row Name 05/10/19 0952 05/14/19 0900 05/29/19 0638 06/25/19 0920 06/26/19 0659   ITP Comments  Virtual orientation completed. Diagnosis can be found in [facility] 7/7. EP and RD orientation scheduled for 7/28 8am  6MWT, gym orientation, and RD initial consult completed today.  Initial ITP created and sent to Dr. Emily Filbert, Medical Director.  30 Day Review Completed today. Continue with ITP unless changed by Medical Director review.   Orientation completed . Has not started sessions  Unable to complete nutrition 30-day re-eval cycle due to inconsistent attendence  30 Day review. Continue with ITP unless directed changes per Medical Director review.    Had another stent   waiting for clearance   Row Name 06/26/19 1402 07/02/19 0908 07/24/19 1050 08/14/19 1513 08/20/19 1137   ITP Comments  Called to check on pt.  He has a new stent and been cleared to return to rehab.  He will restart on Tues 9/15.  First full day of exercise!  Patient was oriented to gym and equipment including functions, settings, policies, and procedures.  Patient's individual exercise prescription and treatment plan were reviewed.  All starting workloads were established based on the results of the 6 minute walk test done at initial orientation visit.  The plan for exercise progression was also introduced and progression will be customized based on patient's performance and goals.  30 day review completed. ITP sent to Dr. Emily Filbert, Medical Director of Cardiac and Pulmonary Rehab. Continue with ITP unless changes are made by physician.  Department closed starting 10/2 until further notice by infection prevention and Health at Work teams for [place].  Cali[place]o will be attending based  on his work schedule  Unable to complete nutrition 30-day re-eval cycle due to inconsistent [place] Name 08/21/19 0711           ITP Comments  30 day review completed. Continue with ITP sent to Dr. Emily Filbert, Medical Director of Cardiac and Pulmonary Rehab for review , changes as needed and signature.  Last visit 10/20          Comments:

## 2019-08-23 ENCOUNTER — Ambulatory Visit: Payer: BC Managed Care – PPO

## 2019-08-28 ENCOUNTER — Telehealth: Payer: Self-pay | Admitting: *Deleted

## 2019-08-28 ENCOUNTER — Encounter: Payer: Self-pay | Admitting: *Deleted

## 2019-08-28 DIAGNOSIS — Z955 Presence of coronary angioplasty implant and graft: Secondary | ICD-10-CM

## 2019-08-28 NOTE — Telephone Encounter (Signed)
Called to check on pt.  Out since 10/20.  Left message.

## 2019-08-30 ENCOUNTER — Ambulatory Visit: Payer: BC Managed Care – PPO

## 2019-09-09 ENCOUNTER — Telehealth: Payer: Self-pay | Admitting: *Deleted

## 2019-09-09 ENCOUNTER — Encounter: Payer: Self-pay | Admitting: *Deleted

## 2019-09-09 DIAGNOSIS — Z955 Presence of coronary angioplasty implant and graft: Secondary | ICD-10-CM

## 2019-09-09 NOTE — Telephone Encounter (Signed)
Called to check on pt.  He has been out since 08/06/19.  Left message.

## 2019-09-17 ENCOUNTER — Encounter: Payer: Self-pay | Admitting: *Deleted

## 2019-09-17 ENCOUNTER — Telehealth: Payer: Self-pay | Admitting: *Deleted

## 2019-09-17 DIAGNOSIS — Z955 Presence of coronary angioplasty implant and graft: Secondary | ICD-10-CM

## 2019-09-17 NOTE — Progress Notes (Signed)
Cardiac Individual Treatment Plan  Patient Details  Name: Brandon Henderson MRN: [id number] Date of Birth: [birth year] Referring Provider:     Cardiac Rehab from 05/14/2019 in [facility] Cardiac and Pulmonary Rehab  Referring Provider  Henderson, Brandon Gave MD [facility] Cardiologist: Dr. Ida Henderson      Initial Encounter Date:    Cardiac Rehab from 05/14/2019 in [facility] Cardiac and Pulmonary Rehab  Date  05/14/19      Visit Diagnosis: Status post coronary artery stent placement  Patient's Home Medications on Admission:  Current Outpatient Medications:  .  Ascorbic Acid (VITAMIN C) 1000 MG tablet, Take 1,000 mg by mouth at bedtime. , Disp: , Rfl:  .  aspirin EC 81 MG tablet, Take 81 mg by mouth at bedtime. , Disp: , Rfl:  .  atorvastatin (LIPITOR) 40 MG tablet, TAKE 1 TABLET BY MOUTH ONCE A DAY (Patient taking differently: Take 40 mg by mouth at bedtime. ), Disp: 90 tablet, Rfl: 3 .  clopidogrel (PLAVIX) 75 MG tablet, Take 1 tablet (75 mg total) by mouth daily with breakfast. (Patient taking differently: Take 75 mg by mouth at bedtime. ), Disp: 90 tablet, Rfl: 3 .  Coenzyme Q10 (COQ10) 200 MG CAPS, Take 200 mg by mouth at bedtime., Disp: , Rfl:  .  diphenhydrAMINE (BENADRYL) 50 MG tablet, Take 1 tablet (50 mg) by mouth prior to leaving home to your procedure with a small sip of water., Disp: 1 tablet, Rfl: 0 .  escitalopram (LEXAPRO) 10 MG tablet, Take 10 mg by mouth at bedtime. , Disp: , Rfl:  .  ezetimibe (ZETIA) 10 MG tablet, Take 10 mg by mouth at bedtime. , Disp: , Rfl:  .  furosemide (LASIX) 40 MG tablet, Take 1 tablet (40 mg total) by mouth daily., Disp: 30 tablet, Rfl: 3 .  Glucosamine-Chondroitin (COSAMIN DS PO), Take 2 tablets by mouth at bedtime. , Disp: , Rfl:  .  isosorbide mononitrate (IMDUR) 30 MG 24 hr tablet, Take 1 tablet (30 mg total) by mouth daily. (Patient taking differently: Take 30 mg by mouth at bedtime. ), Disp: 90 tablet, Rfl: 3 .  magnesium oxide (MAG-OX) 400 MG  tablet, Take 400 mg by mouth at bedtime., Disp: , Rfl:  .  metoprolol tartrate (LOPRESSOR) 25 MG tablet, Take 0.5 tablets (12.5 mg total) by mouth 2 (two) times daily., Disp: 90 tablet, Rfl: 3 .  Multiple Vitamin (MULTIVITAMIN WITH MINERALS) TABS tablet, Take 1 tablet by mouth at bedtime., Disp: , Rfl:  .  nitroGLYCERIN (NITROSTAT) 0.4 MG SL tablet, Place 1 tablet (0.4 mg total) under the tongue every 5 (five) minutes as needed for chest pain., Disp: 90 tablet, Rfl: 3 .  pantoprazole (PROTONIX) 40 MG tablet, Take 40 mg by mouth at bedtime. , Disp: , Rfl:  .  Psyllium (METAMUCIL PO), Take 1 Dose by mouth at bedtime., Disp: , Rfl:  .  ramipril (ALTACE) 5 MG capsule, TAKE 1 CAPSULE BY MOUTH ONCE DAILY (Patient taking differently: Take 5 mg by mouth at bedtime. ), Disp: 90 capsule, Rfl: 3 .  ranolazine (RANEXA) 500 MG 12 hr tablet, Take 1 tablet (500 mg total) by mouth 2 (two) times daily., Disp: 60 tablet, Rfl: 11  Past Medical History: Past Medical History:  Diagnosis Date  . Anginal pain ([place])   . Arthritis   . Barrett esophagus   . Carotid arterial disease ([place])    a. 2011 s/p R CEA.  . Coronary artery disease    a. 2000 s/p  PCI/BMS x 2 of OM2 Brandon Henderson, [place]); b. 12/2018 MV: EF 43% (likely 2/2 GI uptake artifact No ischemia. Low risk; c. 03/2019 PCI: LM nl, LAD 44m LCX nl, OM2 90 ISR, RCA 40p, 863m3.5x15 Resolute Onyx DES), RPL2 70. d. 06/04/2019 LHC 1. severe OM2 (suspected in stent restenosis), mild to mod dz LAD & RCA, patent mRCA stent, nl LV pressure, balloon angio to OM2 90p to 40p  . Depression   . Diabetes mellitus without complication ([place]  . Diastasis recti   . GERD (gastroesophageal reflux disease)   . History of hiatal hernia   . History of kidney stones   . History of tobacco abuse   . Hyperlipidemia LDL goal <70   . Hypertension   . Morbid [place] ([place]  . Osteoarthritis   . Sleep apnea     Tobacco Use: Social History   Tobacco Use  Smoking Status Former Smoker   Smokeless Tobacco Never Used  Tobacco Comment   quit vaping 10/2018. quit smoking 2016    Labs: Recent Review Flowsheet Data    Labs for ITP Cardiac and Pulmonary Rehab Latest Ref Rng & Units 01/01/2019   Cholestrol 100 - 199 mg/dL 151   LDLCALC 0 - 99 mg/dL 76   HDL >39 mg/dL 44   Trlycerides 0 - 149 mg/dL 154(H)   Hemoglobin A1c 4.8 - 5.6 % 6.2(H)       Exercise Target Goals: Exercise Program Goal: Individual exercise prescription set using results from initial 6 min walk test and THRR while considering  patient's activity barriers and safety.   Exercise Prescription Goal: Initial exercise prescription builds to 30-45 minutes a day of aerobic activity, 2-3 days per week.  Home exercise guidelines will be given to patient during program as part of exercise prescription that the participant will acknowledge.  Activity Barriers & Risk Stratification: Activity Barriers & Cardiac Risk Stratification - 05/14/19 0905      Activity Barriers & Cardiac Risk Stratification   Activity Barriers  Back Problems;Joint Problems;Neck/Spine Problems;Deconditioning;Muscular Weakness;Shortness of Breath;Chest Pain/Angina    Cardiac Risk Stratification  Moderate       6 Minute Walk: 6 Minute Walk    Row Name 05/14/19 0903         6 Minute Walk   Phase  Initial     Distance  1430 feet     Walk Time  6 minutes     # of Rest Breaks  0     MPH  2.71     METS  3.16     RPE  15     Perceived Dyspnea   3     VO2 Peak  11.07     Symptoms  Yes (comment)     Comments  chest pain 5/10 relieved with rest, SOB, R knee pain 4/10     Resting HR  68 bpm     Resting BP  126/70     Resting Oxygen Saturation   98 %     Exercise Oxygen Saturation  during 6 min walk  96 %     Max Ex. HR  113 bpm     Max Ex. BP  176/82     2 Minute Post BP  142/70        Oxygen Initial Assessment:   Oxygen Re-Evaluation:   Oxygen Discharge (Final Oxygen Re-Evaluation):   Initial Exercise  Prescription: Initial Exercise Prescription - 05/14/19 0900      Date of Initial  Exercise RX and Referring Provider   Date  05/14/19    Referring Provider  Henderson, Christopher MD   Primary Cardiologist: Dr. Ida Henderson     Treadmill   MPH  2.5    Grade  0.5    Minutes  15    METs  3.09      NuStep   Level  2    SPM  80    Minutes  15    METs  3      Recumbant Elliptical   Level  1    RPM  50    Minutes  15    METs  3      T5 Nustep   Level  2    SPM  80    Minutes  15    METs  3      Prescription Details   Frequency (times per week)  3    Duration  Progress to 30 minutes of continuous aerobic without signs/symptoms of physical distress      Intensity   THRR 40-80% of Max Heartrate  103-138    Ratings of Perceived Exertion  11-13    Perceived Dyspnea  0-4      Progression   Progression  Continue to progress workloads to maintain intensity without signs/symptoms of physical distress.      Resistance Training   Training Prescription  Yes    Weight  3 lbs    Reps  10-15       Perform Capillary Blood Glucose checks as needed.  Exercise Prescription Changes: Exercise Prescription Changes    Row Name 05/14/19 0900 07/17/19 1400 07/30/19 1400 08/14/19 1500       Response to Exercise   Blood Pressure (Admit)  126/70  110/62  122/70  122/58    Blood Pressure (Exercise)  176/82  124/64  130/64  193/68    Blood Pressure (Exit)  142/70  108/60  124/60  110/62    Heart Rate (Admit)  68 bpm  85 bpm  72 bpm  61 bpm    Heart Rate (Exercise)  113 bpm  110 bpm  87 bpm  104 bpm    Heart Rate (Exit)  79 bpm  84 bpm  66 bpm  76 bpm    Oxygen Saturation (Admit)  98 %  -  -  -    Oxygen Saturation (Exercise)  96 %  -  -  -    Rating of Perceived Exertion (Exercise)  15  11  15  14     Perceived Dyspnea (Exercise)  3  -  -  -    Symptoms  chest pain 5/10 (relieved with rest), SOB, r knee pain 4/10  none  none  none    Comments  walk test results  -  -  -    Duration  -   Progress to 30 minutes of  aerobic without signs/symptoms of physical distress  Continue with 30 min of aerobic exercise without signs/symptoms of physical distress.  Continue with 30 min of aerobic exercise without signs/symptoms of physical distress.    Intensity  -  THRR unchanged  THRR unchanged  THRR unchanged      Progression   Progression  -  Continue to progress workloads to maintain intensity without signs/symptoms of physical distress.  Continue to progress workloads to maintain intensity without signs/symptoms of physical distress.  Continue to progress workloads to maintain intensity without signs/symptoms of physical distress.  Average METs  -  2.55  2.62  3.5      Resistance Training   Training Prescription  -  Yes  Yes  Yes    Weight  -  4 lb  4 lbs  4 lbs    Reps  -  10-15  10-15  10-15      Interval Training   Interval Training  -  -  No  No      Treadmill   MPH  -  2.5  2.5  -    Grade  -  0  0  -    Minutes  -  15  15  -    METs  -  2.99  2.99  -      Arm Ergometer   Level  -  -  1  -    Minutes  -  -  15  -    METs  -  -  2.4  -      Recumbant Elliptical   Level  -  2  -  -    RPM  -  50  -  -    Minutes  -  15  -  -      T5 Nustep   Level  -  3  3  -    SPM  -  80  -  -    Minutes  -  15  15  -    METs  -  2.2  2.2  -      Biostep-RELP   Level  -  -  3  5    Minutes  -  -  15  15    METs  -  -  3  4       Exercise Comments: Exercise Comments    Row Name 07/02/19 0907           Exercise Comments  First full day of exercise!  Patient was oriented to gym and equipment including functions, settings, policies, and procedures.  Patient's individual exercise prescription and treatment plan were reviewed.  All starting workloads were established based on the results of the 6 minute walk test done at initial orientation visit.  The plan for exercise progression was also introduced and progression will be customized based on patient's performance and  goals.          Exercise Goals and Review: Exercise Goals    Row Name 05/14/19 0909             Exercise Goals   Increase Physical Activity  Yes       Intervention  Provide advice, education, support and counseling about physical activity/exercise needs.;Develop an individualized exercise prescription for aerobic and resistive training based on initial evaluation findings, risk stratification, comorbidities and participant's personal goals.       Expected Outcomes  Short Term: Attend rehab on a regular basis to increase amount of physical activity.;Long Term: Add in home exercise to make exercise part of routine and to increase amount of physical activity.;Long Term: Exercising regularly at least 3-5 days a week.       Increase Strength and Stamina  Yes       Intervention  Provide advice, education, support and counseling about physical activity/exercise needs.;Develop an individualized exercise prescription for aerobic and resistive training based on initial evaluation findings, risk stratification, comorbidities and participant's personal goals.       Expected Outcomes  Short Term: Increase workloads from initial exercise prescription for resistance, speed, and METs.;Short Term: Perform resistance training exercises routinely during rehab and add in resistance training at home;Long Term: Improve cardiorespiratory fitness, muscular endurance and strength as measured by increased METs and functional capacity (6MWT)       Able to understand and use rate of perceived exertion (RPE) scale  Yes       Intervention  Provide education and explanation on how to use RPE scale       Expected Outcomes  Short Term: Able to use RPE daily in rehab to express subjective intensity level;Long Term:  Able to use RPE to guide intensity level when exercising independently       Able to understand and use Dyspnea scale  Yes       Intervention  Provide education and explanation on how to use Dyspnea scale        Expected Outcomes  Short Term: Able to use Dyspnea scale daily in rehab to express subjective sense of shortness of breath during exertion;Long Term: Able to use Dyspnea scale to guide intensity level when exercising independently       Knowledge and understanding of Target Heart Rate Range (THRR)  Yes       Intervention  Provide education and explanation of THRR including how the numbers were predicted and where they are located for reference       Expected Outcomes  Short Term: Able to use daily as guideline for intensity in rehab;Long Term: Able to use THRR to govern intensity when exercising independently;Short Term: Able to state/look up THRR       Able to check pulse independently  Yes       Intervention  Provide education and demonstration on how to check pulse in carotid and radial arteries.;Review the importance of being able to check your own pulse for safety during independent exercise       Expected Outcomes  Short Term: Able to explain why pulse checking is important during independent exercise;Long Term: Able to check pulse independently and accurately       Understanding of Exercise Prescription  Yes       Intervention  Provide education, explanation, and written materials on patient's individual exercise prescription       Expected Outcomes  Short Term: Able to explain program exercise prescription;Long Term: Able to explain home exercise prescription to exercise independently          Exercise Goals Re-Evaluation : Exercise Goals Re-Evaluation    Row Name 07/02/19 0908 07/17/19 1439 07/17/19 1446 07/29/19 0853 08/14/19 1513     Exercise Goal Re-Evaluation   Exercise Goals Review  -  Increase Physical Activity;Increase Strength and Stamina;Able to understand and use rate of perceived exertion (RPE) scale;Knowledge and understanding of Target Heart Rate Range (THRR);Able to check pulse independently;Understanding of Exercise Prescription  -  Increase Physical Activity;Increase  Strength and Stamina;Understanding of Exercise Prescription  Increase Physical Activity;Increase Strength and Stamina;Understanding of Exercise Prescription   Comments  Reviewed RPE scale, THR and program prescription with pt today.  Pt voiced understanding and was given a copy of goals to take home.  Pt has been consistent two days per week since returning to [place] is doing well in rehab.  He is now attending three days a week.  Over the weekend he has been walking more.  They went to the mountains to hike to see the leaves.  He is feeling better overall.  Lane is now attending when his work schedule allows.  He has a set time to come to class and attends as able. We are usually able to set up his schedule a week or two in advance.  He is up to level 5 on the BioStep now.  We will continue to monitor his progress.   Expected Outcomes  Short: Use RPE daily to regulate intensity. Long: Follow program prescription in THR.  -  Short - continue to attend consistently Long - improve stamina  Short: Continue to walk on his off days.  Long: Continue to improve stamina.  Short: Attend class when able  Long: Continue to improve stamina.   [place] Name 08/28/19 1359             Exercise Goal Re-Evaluation   Comments  Out since last review          Discharge Exercise Prescription (Final Exercise Prescription Changes): Exercise Prescription Changes - 08/14/19 1500      Response to Exercise   Blood Pressure (Admit)  122/58    Blood Pressure (Exercise)  193/68    Blood Pressure (Exit)  110/62    Heart Rate (Admit)  61 bpm    Heart Rate (Exercise)  104 bpm    Heart Rate (Exit)  76 bpm    Rating of Perceived Exertion (Exercise)  14    Symptoms  none    Duration  Continue with 30 min of aerobic exercise without signs/symptoms of physical distress.    Intensity  THRR unchanged      Progression   Progression  Continue to progress workloads to maintain intensity without signs/symptoms of physical  distress.    Average METs  3.5      Resistance Training   Training Prescription  Yes    Weight  4 lbs    Reps  10-15      Interval Training   Interval Training  No      Biostep-RELP   Level  5    Minutes  15    METs  4       Nutrition:  Target Goals: Understanding of nutrition guidelines, daily intake of sodium <1544m, cholesterol <2051m calories 30% from fat and 7% or less from saturated fats, daily to have 5 or more servings of fruits and vegetables.  Biometrics: Pre Biometrics - 05/14/19 0910      Pre Biometrics   Height  5' 6.5" (1.689 m)    Weight  253 lb 4.8 oz (114.9 kg)    BMI (Calculated)  40.28    Single Leg Stand  30 seconds        Nutrition Therapy Plan and Nutrition Goals: Nutrition Therapy & Goals - 05/14/19 0918      Nutrition Therapy   Diet  Low Na HH diet    Drug/Food Interactions  Statins/Certain Fruits   lipitor   Protein (specify units)  95g    Fiber  30 grams    Whole Grain Foods  3 servings    Saturated Fats  12 max. grams    Fruits and Vegetables  5 servings/day    Sodium  1.5 grams      Personal Nutrition Goals   Nutrition Goal  ST: reduce Na intake to 1500-200045mange LT: increase functional years and extend working years    Comments  Pt reports works at [employer] 5am starting. If 330am will drink veggie juice, if 5am will drink coffee. Pt reports regardless will have B  at 7am (instant oatmeal, fruit, small amount of brown sugar). L is multigrain bread with [place] cold cuts and some vegetable or fruit. D baked/grilled/sauteed ("healthy oil" his wife uses) fish or meat with wedge salad (cheese, olives, balsalmic, iceburg) or other veggies (roasted with the same oil). Pt will have yasso (frozen yogurt pop) for snack and beer (lime salt and lime wedge) or wine (~4x/week). Pt reports knowing what to do but getting back into it. Will go out to eat once per month but will have associated guilt afterwards. Pt once did diet (if high fat, low CHO -  if high CHO, low fat) wants to try to move towards that. Pt will use spice mix with salt, lime/lemon, and pepper instead of conventional salt. Pt will sometimes use NoSalt, but doesn't care for it on certain things. RD noted A1c 6.2, fasting BG no higher than 121 (these are prediabetic numbers), inquired with pt because diagnosis of diabetes in chart, pt reports not having diabetes, he was told he had prediabetes. Pt reports going to sleep at 630-7pm. Pt reports taking water pill, he will try to weight himslef and check his legs more often.      Intervention Plan   Intervention  Prescribe, educate and counsel regarding individualized specific dietary modifications aiming towards targeted core components such as weight, hypertension, lipid management, diabetes, heart failure and other comorbidities.;Nutrition handout(s) given to patient.    Expected Outcomes  Short Term Goal: Understand basic principles of dietary content, such as calories, fat, sodium, cholesterol and nutrients.;Short Term Goal: A plan has been developed with personal nutrition goals set during dietitian appointment.;Long Term Goal: Adherence to prescribed nutrition plan.       Nutrition Assessments: Nutrition Assessments - 05/13/19 0754      MEDFICTS Scores   Pre Score  18       Nutrition Goals Re-Evaluation: Nutrition Goals Re-Evaluation    Row Name 07/04/19 0932             Goals   Current Weight  262 lb 3.2 oz (118.9 kg)       Nutrition Goal  ST: continue to be mindful and reduce Na intake to 1500-2061m range LT: increase functional years and extend working years, pt wants to lost weight for sleep apnea       Comment  beer salt (lime flavored salt) --> lime. low to no salt V8 + salt --> V* w/o salt. No salt shaker at dinner, [place]end (lower in salt).       Expected Outcome  ST: continue to be mindful and reduce Na intake to 1500-20031mrange LT: increase functional years and extend working years, pt wants to  lost weight for sleep apnea          Nutrition Goals Discharge (Final Nutrition Goals Re-Evaluation): Nutrition Goals Re-Evaluation - 07/04/19 0932      Goals   Current Weight  262 lb 3.2 oz (118.9 kg)    Nutrition Goal  ST: continue to be mindful and reduce Na intake to 1500-200084mange LT: increase functional years and extend working years, pt wants to lost weight for sleep apnea    Comment  beer salt (lime flavored salt) --> lime. low to no salt V8 + salt --> V* w/o salt. No salt shaker at dinner, cal[place]end (lower in salt).    Expected Outcome  ST: continue to be mindful and reduce Na intake to 1500-2000m31mnge LT: increase functional years and extend working years, pt wants  to lost weight for sleep apnea       Psychosocial: Target Goals: Acknowledge presence or absence of significant depression and/or stress, maximize coping skills, provide positive support system. Participant is able to verbalize types and ability to use techniques and skills needed for reducing stress and depression.   Initial Review & Psychosocial Screening: Initial Psych Review & Screening - 05/10/19 0954      Initial Review   Current issues with  Current Stress Concerns    Comments  Zain reports doing well now that he is back to work post Stent. He and his wife have been doing well during the pandemic. He said he feels a less stressed now that he can technically retire any time from his job at the [employer]?  Yes   wife     Barriers   Psychosocial barriers to participate in program  There are no identifiable barriers or psychosocial needs.      Screening Interventions   Interventions  Encouraged to exercise    Expected Outcomes  Short Term goal: Utilizing psychosocial counselor, staff and physician to assist with identification of specific Stressors or current issues interfering with healing process. Setting desired goal for each  stressor or current issue identified.;Long Term Goal: Stressors or current issues are controlled or eliminated.;Short Term goal: Identification and review with participant of any Quality of Life or Depression concerns found by scoring the questionnaire.;Long Term goal: The participant improves quality of Life and PHQ9 Scores as seen by post scores and/or verbalization of changes       Quality of Life Scores:  Quality of Life - 05/13/19 0753      Quality of Life   Select  Quality of Life      Quality of Life Scores   Health/Function Pre  15.93 %    Socioeconomic Pre  16.25 %    Psych/Spiritual Pre  21.21 %    Family Pre  25.9 %    GLOBAL Pre  18.48 %      Scores of 19 and below usually indicate a poorer quality of life in these areas.  A difference of  2-3 points is a clinically meaningful difference.  A difference of 2-3 points in the total score of the Quality of Life Index has been associated with significant improvement in overall quality of life, self-image, physical symptoms, and general health in studies assessing change in quality of life.  PHQ-9: Recent Review Flowsheet Data    Depression screen [facility] 2/9 05/14/2019   Decreased Interest 1   Down, Depressed, Hopeless 0   PHQ - 2 Score 1   Altered sleeping 0   Tired, decreased energy 2   Change in appetite 0   Feeling bad or failure about yourself  0   Trouble concentrating 0   Moving slowly or fidgety/restless 0   Suicidal thoughts 0   PHQ-9 Score 3   Difficult doing work/chores Not difficult at all     Interpretation of Total Score  Total Score Depression Severity:  1-4 = Minimal depression, 5-9 = Mild depression, 10-14 = Moderate depression, 15-19 = Moderately severe depression, 20-27 = Severe depression   Psychosocial Evaluation and Intervention:   Psychosocial Re-Evaluation: Psychosocial Re-Evaluation    [place] Name 07/11/19 0924 07/29/19 0855           Psychosocial Re-Evaluation   Current issues with  Current  Sleep Concerns;Current Stress Concerns  Current Sleep Concerns;Current Stress Concerns      Comments  Simona Huh is not sleeping well. He has an appointment in November for sleep study/CPAP.  He reports no other stress at this time  Ravon is doing well in rehab.  He is doing well mentally.  He starts back to work on Monday and will have a different schedule.  He may to need have a varing schedule week.   He would like to come into class on his days off.   He has been sleeping well.      Expected Outcomes  Short - follow through with sleep study Long - be able to sleep well  Short: Adjust to being back to work.  Long: Continue to practice self care.      Interventions  Encouraged to attend Cardiac Rehabilitation for the exercise  Encouraged to attend Cardiac Rehabilitation for the exercise      Continue Psychosocial Services   -  Follow up required by staff         Psychosocial Discharge (Final Psychosocial Re-Evaluation): Psychosocial Re-Evaluation - 07/29/19 0855      Psychosocial Re-Evaluation   Current issues with  Current Sleep Concerns;Current Stress Concerns    Comments  Kaileb is doing well in rehab.  He is doing well mentally.  He starts back to work on Monday and will have a different schedule.  He may to need have a varing schedule week.   He would like to come into class on his days off.   He has been sleeping well.    Expected Outcomes  Short: Adjust to being back to work.  Long: Continue to practice self care.    Interventions  Encouraged to attend Cardiac Rehabilitation for the exercise    Continue Psychosocial Services   Follow up required by staff       Vocational Rehabilitation: Provide vocational rehab assistance to qualifying candidates.   Vocational Rehab Evaluation & Intervention: Vocational Rehab - 05/10/19 0953      Initial Vocational Rehab Evaluation & Intervention   Assessment shows need for Vocational Rehabilitation  No       Education: Education Goals:  Education classes will be provided on a variety of topics geared toward better understanding of heart health and risk factor modification. Participant will state understanding/return demonstration of topics presented as noted by education test scores.  Learning Barriers/Preferences: Learning Barriers/Preferences - 05/10/19 0953      Learning Barriers/Preferences   Learning Barriers  None    Learning Preferences  None       Education Topics:  AED/CPR: - Group verbal and written instruction with the use of models to demonstrate the basic use of the AED with the basic ABC's of resuscitation.   General Nutrition Guidelines/Fats and Fiber: -Group instruction provided by verbal, written material, models and posters to present the general guidelines for heart healthy nutrition. Gives an explanation and review of dietary fats and fiber.   Controlling Sodium/Reading Food Labels: -Group verbal and written material supporting the discussion of sodium use in heart healthy nutrition. Review and explanation with models, verbal and written materials for utilization of the food label.   Exercise Physiology & General Exercise Guidelines: - Group verbal and written instruction with models to review the exercise physiology of the cardiovascular system and associated critical values. Provides general exercise guidelines with specific guidelines to those with heart or lung disease.    Aerobic Exercise & Resistance Training: - Gives group verbal and written instruction on  the various components of exercise. Focuses on aerobic and resistive training programs and the benefits of this training and how to safely progress through these programs..   Flexibility, Balance, Mind/Body Relaxation: Provides group verbal/written instruction on the benefits of flexibility and balance training, including mind/body exercise modes such as yoga, pilates and tai chi.  Demonstration and skill practice provided.   Stress  and Anxiety: - Provides group verbal and written instruction about the health risks of elevated stress and causes of high stress.  Discuss the correlation between heart/lung disease and anxiety and treatment options. Review healthy ways to manage with stress and anxiety.   Depression: - Provides group verbal and written instruction on the correlation between heart/lung disease and depressed mood, treatment options, and the stigmas associated with seeking treatment.   Anatomy & Physiology of the Heart: - Group verbal and written instruction and models provide basic cardiac anatomy and physiology, with the coronary electrical and arterial systems. Review of Valvular disease and Heart Failure   Cardiac Procedures: - Group verbal and written instruction to review commonly prescribed medications for heart disease. Reviews the medication, class of the drug, and side effects. Includes the steps to properly store meds and maintain the prescription regimen. (beta blockers and nitrates)   Cardiac Medications I: - Group verbal and written instruction to review commonly prescribed medications for heart disease. Reviews the medication, class of the drug, and side effects. Includes the steps to properly store meds and maintain the prescription regimen.   Cardiac Medications II: -Group verbal and written instruction to review commonly prescribed medications for heart disease. Reviews the medication, class of the drug, and side effects. (all other drug classes)    Go Sex-Intimacy & Heart Disease, Get SMART - Goal Setting: - Group verbal and written instruction through game format to discuss heart disease and the return to sexual intimacy. Provides group verbal and written material to discuss and apply goal setting through the application of the S.M.A.R.T. Method.   Other Matters of the Heart: - Provides group verbal, written materials and models to describe Stable Angina and Peripheral Artery. Includes  description of the disease process and treatment options available to the cardiac patient.   Exercise & Equipment Safety: - Individual verbal instruction and demonstration of equipment use and safety with use of the equipment.   Infection Prevention: - Provides verbal and written material to individual with discussion of infection control including proper hand washing and proper equipment cleaning during exercise session.   Falls Prevention: - Provides verbal and written material to individual with discussion of falls prevention and safety.   Diabetes: - Individual verbal and written instruction to review signs/symptoms of diabetes, desired ranges of glucose level fasting, after meals and with exercise. Acknowledge that pre and post exercise glucose checks will be done for 3 sessions at entry of program.   Know Your Numbers and Risk Factors: -Group verbal and written instruction about important numbers in your health.  Discussion of what are risk factors and how they play a role in the disease process.  Review of Cholesterol, Blood Pressure, Diabetes, and BMI and the role they play in your overall health.   Sleep Hygiene: -Provides group verbal and written instruction about how sleep can affect your health.  Define sleep hygiene, discuss sleep cycles and impact of sleep habits. Review good sleep hygiene tips.    Other: -Provides group and verbal instruction on various topics (see comments)   Knowledge Questionnaire Score: Knowledge Questionnaire Score - 05/13/19  0300      Knowledge Questionnaire Score   Pre Score  26/26       Core Components/Risk Factors/Patient Goals at Admission: Personal Goals and Risk Factors at Admission - 05/14/19 0910      Core Components/Risk Factors/Patient Goals on Admission    Weight Management  Weight Loss;Yes;Obesity    Intervention  Weight Management: Develop a combined nutrition and exercise program designed to reach desired caloric intake,  while maintaining appropriate intake of nutrient and fiber, sodium and fats, and appropriate energy expenditure required for the weight goal.;Weight Management: Provide education and appropriate resources to help participant work on and attain dietary goals.;Weight Management/Obesity: Establish reasonable short term and long term weight goals.;Obesity: Provide education and appropriate resources to help participant work on and attain dietary goals.    Admit Weight  253 lb 4.8 oz (114.9 kg)    Goal Weight: Short Term  248 lb (112.5 kg)    Goal Weight: Long Term  240 lb (108.9 kg)    Expected Outcomes  Short Term: Continue to assess and modify interventions until short term weight is achieved;Long Term: Adherence to nutrition and physical activity/exercise program aimed toward attainment of established weight goal;Weight Loss: Understanding of general recommendations for a balanced deficit meal plan, which promotes 1-2 lb weight loss per week and includes a negative energy balance of [phone] kcal/d;Understanding recommendations for meals to include 15-35% energy as protein, 25-35% energy from fat, 35-60% energy from carbohydrates, less than 259m of dietary cholesterol, 20-35 gm of total fiber daily;Understanding of distribution of calorie intake throughout the day with the consumption of 4-5 meals/snacks    Hypertension  Yes    Intervention  Provide education on lifestyle modifcations including regular physical activity/exercise, weight management, moderate sodium restriction and increased consumption of fresh fruit, vegetables, and low fat dairy, alcohol moderation, and smoking cessation.;Monitor prescription use compliance.    Expected Outcomes  Short Term: Continued assessment and intervention until BP is < 140/932mHG in hypertensive participants. < 130/8028mG in hypertensive participants with diabetes, heart failure or chronic kidney disease.;Long Term: Maintenance of blood pressure at goal levels.     Lipids  Yes    Intervention  Provide education and support for participant on nutrition & aerobic/resistive exercise along with prescribed medications to achieve LDL <47m39mDL >40mg72m Expected Outcomes  Short Term: Participant states understanding of desired cholesterol values and is compliant with medications prescribed. Participant is following exercise prescription and nutrition guidelines.;Long Term: Cholesterol controlled with medications as prescribed, with individualized exercise RX and with personalized nutrition plan. Value goals: LDL < 47mg,36m > 40 mg.       Core Components/Risk Factors/Patient Goals Review:  Goals and Risk Factor Review    Row Name 07/11/19 0922 19233/20 0857           Core Components/Risk Factors/Patient Goals Review   Personal Goals Review  Weight Management/Obesity;Hypertension;Lipids  Weight Management/Obesity;Hypertension;Lipids      Review  DennisSimona Huhking meds as directed.  He is walking on days not at [facility].  [facility] and reduce carbs/processed foods.  His wife is on board and does most of the cooking.  RobertGohaning well in rehab.  His weight is up and down some.  He is working on new eating habits and trying to figure it all out.   He is doing well with his new diet.  He checks his pressures daily here and at home  and has been getting good numbers.  Overall, things are going well.      Expected Outcomes  Short - continue walking/ making dietary changes Long - maintain heart healthy habits  Short: Continue to work on diet for weight loss.  Long: Continue to monitor risk factors.         Core Components/Risk Factors/Patient Goals at Discharge (Final Review):  Goals and Risk Factor Review - 07/29/19 0857      Core Components/Risk Factors/Patient Goals Review   Personal Goals Review  Weight Management/Obesity;Hypertension;Lipids    Review  Kyree is doing well in rehab.  His weight is up and down some.  He is working on new eating  habits and trying to figure it all out.   He is doing well with his new diet.  He checks his pressures daily here and at home and has been getting good numbers.  Overall, things are going well.    Expected Outcomes  Short: Continue to work on diet for weight loss.  Long: Continue to monitor risk factors.       ITP Comments: ITP Comments    Row Name 05/10/19 0952 05/14/19 0900 05/29/19 0638 06/25/19 0920 06/26/19 0659   ITP Comments  Virtual orientation completed. Diagnosis can be found in [facility] 7/7. EP and RD orientation scheduled for 7/28 8am  6MWT, gym orientation, and RD initial consult completed today.  Initial ITP created and sent to Dr. Emily Filbert, Medical Director.  30 Day Review Completed today. Continue with ITP unless changed by Medical Director review.   Orientation completed . Has not started sessions  Unable to complete nutrition 30-day re-eval cycle due to inconsistent attendence  30 Day review. Continue with ITP unless directed changes per Medical Director review.    Had another stent   waiting for clearance   Row Name 06/26/19 1402 07/02/19 0908 07/24/19 1050 08/14/19 1513 08/20/19 1137   ITP Comments  Called to check on pt.  He has a new stent and been cleared to return to rehab.  He will restart on Tues 9/15.  First full day of exercise!  Patient was oriented to gym and equipment including functions, settings, policies, and procedures.  Patient's individual exercise prescription and treatment plan were reviewed.  All starting workloads were established based on the results of the 6 minute walk test done at initial orientation visit.  The plan for exercise progression was also introduced and progression will be customized based on patient's performance and goals.  30 day review completed. ITP sent to Dr. Emily Filbert, Medical Director of Cardiac and Pulmonary Rehab. Continue with ITP unless changes are made by physician.  Department closed starting 10/2 until further notice by infection  prevention and Health at Work teams for [place].  Duval will be attending based on his work schedule  Unable to complete nutrition 30-day re-eval cycle due to inconsistent [place] Name 08/21/19 0711 08/28/19 1359 09/09/19 1315 09/17/19 1129 09/17/19 1441   ITP Comments  30 day review completed. Continue with ITP sent to Dr. Emily Filbert, Medical Director of Cardiac and Pulmonary Rehab for review , changes as needed and signature.  Last visit 10/20  Called to check on pt.  Out since 10/20.  Left message.  Called to check on pt.  He has been out since 08/06/19.  Left message.  Called to check on pt. Out since 10/20. Left message.  Sent letter.  Tyan returned our call.  He has been working and  still having trouble with his knee and back.  He would like to discharge from the program at this time.      Comments: Discharge ITP

## 2019-09-17 NOTE — Telephone Encounter (Signed)
Called to check on pt. Out since 10/20. Left message.  Sent letter.

## 2019-09-17 NOTE — Progress Notes (Signed)
Discharge Progress Report  Patient Details  Name: Brandon Henderson MRN: [id number] Date of Birth: October 07, 1956 Referring Provider:     Cardiac Rehab from 05/14/2019 in [facility]  Referring Provider  End, Harrell Gave MD [Primary Cardiologist: Dr. Ida Rogue       Number of Visits: 11  Reason for Discharge:  Early Exit:  Back to work, Personal and Lack of attendance  Smoking History:  Social History   Tobacco Use  Smoking Status Former Smoker  Smokeless Tobacco Never Used  Tobacco Comment   quit vaping 10/2018. quit smoking 2016    Diagnosis:  Status post coronary artery stent placement  ADL UCSD:   Initial Exercise Prescription: Initial Exercise Prescription - 05/14/19 0900      Date of Initial Exercise RX and Referring Provider   Date  05/14/19    Referring Provider  End, Christopher MD   Primary Cardiologist: Dr. Ida Rogue     Treadmill   MPH  2.5    Grade  0.5    Minutes  15    METs  3.09      NuStep   Level  2    SPM  80    Minutes  15    METs  3      Recumbant Elliptical   Level  1    RPM  50    Minutes  15    METs  3      T5 Nustep   Level  2    SPM  80    Minutes  15    METs  3      Prescription Details   Frequency (times per week)  3    Duration  Progress to 30 minutes of continuous aerobic without signs/symptoms of physical distress      Intensity   THRR 40-80% of Max Heartrate  103-138    Ratings of Perceived Exertion  11-13    Perceived Dyspnea  0-4      Progression   Progression  Continue to progress workloads to maintain intensity without signs/symptoms of physical distress.      Resistance Training   Training Prescription  Yes    Weight  3 lbs    Reps  10-15       Discharge Exercise Prescription (Final Exercise Prescription Changes): Exercise Prescription Changes - 08/14/19 1500      Response to Exercise   Blood Pressure (Admit)  122/58    Blood Pressure (Exercise)  193/68    Blood  Pressure (Exit)  110/62    Heart Rate (Admit)  61 bpm    Heart Rate (Exercise)  104 bpm    Heart Rate (Exit)  76 bpm    Rating of Perceived Exertion (Exercise)  14    Symptoms  none    Duration  Continue with 30 min of aerobic exercise without signs/symptoms of physical distress.    Intensity  THRR unchanged      Progression   Progression  Continue to progress workloads to maintain intensity without signs/symptoms of physical distress.    Average METs  3.5      Resistance Training   Training Prescription  Yes    Weight  4 lbs    Reps  10-15      Interval Training   Interval Training  No      Biostep-RELP   Level  5    Minutes  15    METs  4  Functional Capacity: 6 Minute Walk    Row Name 05/14/19 0903         6 Minute Walk   Phase  Initial     Distance  1430 feet     Walk Time  6 minutes     # of Rest Breaks  0     MPH  2.71     METS  3.16     RPE  15     Perceived Dyspnea   3     VO2 Peak  11.07     Symptoms  Yes (comment)     Comments  chest pain 5/10 relieved with rest, SOB, R knee pain 4/10     Resting HR  68 bpm     Resting BP  126/70     Resting Oxygen Saturation   98 %     Exercise Oxygen Saturation  during 6 min walk  96 %     Max Ex. HR  113 bpm     Max Ex. BP  176/82     2 Minute Post BP  142/70        Psychological, QOL, Others - Outcomes: PHQ 2/9: Depression screen PHQ 2/9 05/14/2019  Decreased Interest 1  Down, Depressed, Hopeless 0  PHQ - 2 Score 1  Altered sleeping 0  Tired, decreased energy 2  Change in appetite 0  Feeling bad or failure about yourself  0  Trouble concentrating 0  Moving slowly or fidgety/restless 0  Suicidal thoughts 0  PHQ-9 Score 3  Difficult doing work/chores Not difficult at all    Quality of Life: Quality of Life - 05/13/19 0753      Quality of Life   Select  Quality of Life      Quality of Life Scores   Health/Function Pre  15.93 %    Socioeconomic Pre  16.25 %    Psych/Spiritual Pre  21.21 %     Family Pre  25.9 %    GLOBAL Pre  18.48 %       Personal Goals: Goals established at orientation with interventions provided to work toward goal. Personal Goals and Risk Factors at Admission - 05/14/19 0910      Core Components/Risk Factors/Patient Goals on Admission    Weight Management  Weight Loss;Yes;Obesity    Intervention  Weight Management: Develop a combined nutrition and exercise program designed to reach desired caloric intake, while maintaining appropriate intake of nutrient and fiber, sodium and fats, and appropriate energy expenditure required for the weight goal.;Weight Management: Provide education and appropriate resources to help participant work on and attain dietary goals.;Weight Management/Obesity: Establish reasonable short term and long term weight goals.;Obesity: Provide education and appropriate resources to help participant work on and attain dietary goals.    Admit Weight  253 lb 4.8 oz (114.9 kg)    Goal Weight: Short Term  248 lb (112.5 kg)    Goal Weight: Long Term  240 lb (108.9 kg)    Expected Outcomes  Short Term: Continue to assess and modify interventions until short term weight is achieved;Long Term: Adherence to nutrition and physical activity/exercise program aimed toward attainment of established weight goal;Weight Loss: Understanding of general recommendations for a balanced deficit meal plan, which promotes 1-2 lb weight loss per week and includes a negative energy balance of [phone] kcal/d;Understanding recommendations for meals to include 15-35% energy as protein, 25-35% energy from fat, 35-60% energy from carbohydrates, less than 200mg  of dietary cholesterol, 20-35 gm  of total fiber daily;Understanding of distribution of calorie intake throughout the day with the consumption of 4-5 meals/snacks    Hypertension  Yes    Intervention  Provide education on lifestyle modifcations including regular physical activity/exercise, weight management, moderate  sodium restriction and increased consumption of fresh fruit, vegetables, and low fat dairy, alcohol moderation, and smoking cessation.;Monitor prescription use compliance.    Expected Outcomes  Short Term: Continued assessment and intervention until BP is < 140/79mm HG in hypertensive participants. < 130/15mm HG in hypertensive participants with diabetes, heart failure or chronic kidney disease.;Long Term: Maintenance of blood pressure at goal levels.    Lipids  Yes    Intervention  Provide education and support for participant on nutrition & aerobic/resistive exercise along with prescribed medications to achieve LDL 70mg , HDL >40mg .    Expected Outcomes  Short Term: Participant states understanding of desired cholesterol values and is compliant with medications prescribed. Participant is following exercise prescription and nutrition guidelines.;Long Term: Cholesterol controlled with medications as prescribed, with individualized exercise RX and with personalized nutrition plan. Value goals: LDL < 70mg , HDL > 40 mg.        Personal Goals Discharge: Goals and Risk Factor Review    Row Name 07/11/19 [id number] 07/29/19 0857           Core Components/Risk Factors/Patient Goals Review   Personal Goals Review  Weight Management/Obesity;Hypertension;Lipids  Weight Management/Obesity;Hypertension;Lipids      Review  Brandon Henderson is taking meds as directed.  He is walking on days not at [facility].  He is trying to knock out sugar and reduce carbs/processed foods.  His wife is on board and does most of the cooking.  Brandon Henderson is doing well in rehab.  His weight is up and down some.  He is working on new eating habits and trying to figure it all out.   He is doing well with his new diet.  He checks his pressures daily here and at home and has been getting good numbers.  Overall, things are going well.      Expected Outcomes  Short - continue walking/ making dietary changes Long - maintain heart healthy habits  Short: Continue to  work on diet for weight loss.  Long: Continue to monitor risk factors.         Exercise Goals and Review: Exercise Goals    Row Name 05/14/19 0909             Exercise Goals   Increase Physical Activity  Yes       Intervention  Provide advice, education, support and counseling about physical activity/exercise needs.;Develop an individualized exercise prescription for aerobic and resistive training based on initial evaluation findings, risk stratification, comorbidities and participant's personal goals.       Expected Outcomes  Short Term: Attend rehab on a regular basis to increase amount of physical activity.;Long Term: Add in home exercise to make exercise part of routine and to increase amount of physical activity.;Long Term: Exercising regularly at least 3-5 days a week.       Increase Strength and Stamina  Yes       Intervention  Provide advice, education, support and counseling about physical activity/exercise needs.;Develop an individualized exercise prescription for aerobic and resistive training based on initial evaluation findings, risk stratification, comorbidities and participant's personal goals.       Expected Outcomes  Short Term: Increase workloads from initial exercise prescription for resistance, speed, and METs.;Short Term: Perform resistance training exercises routinely during  rehab and add in resistance training at home;Long Term: Improve cardiorespiratory fitness, muscular endurance and strength as measured by increased METs and functional capacity (6MWT)       Able to understand and use rate of perceived exertion (RPE) scale  Yes       Intervention  Provide education and explanation on how to use RPE scale       Expected Outcomes  Short Term: Able to use RPE daily in rehab to express subjective intensity level;Long Term:  Able to use RPE to guide intensity level when exercising independently       Able to understand and use Dyspnea scale  Yes       Intervention  Provide  education and explanation on how to use Dyspnea scale       Expected Outcomes  Short Term: Able to use Dyspnea scale daily in rehab to express subjective sense of shortness of breath during exertion;Long Term: Able to use Dyspnea scale to guide intensity level when exercising independently       Knowledge and understanding of Target Heart Rate Range (THRR)  Yes       Intervention  Provide education and explanation of THRR including how the numbers were predicted and where they are located for reference       Expected Outcomes  Short Term: Able to use daily as guideline for intensity in rehab;Long Term: Able to use THRR to govern intensity when exercising independently;Short Term: Able to state/look up THRR       Able to check pulse independently  Yes       Intervention  Provide education and demonstration on how to check pulse in carotid and radial arteries.;Review the importance of being able to check your own pulse for safety during independent exercise       Expected Outcomes  Short Term: Able to explain why pulse checking is important during independent exercise;Long Term: Able to check pulse independently and accurately       Understanding of Exercise Prescription  Yes       Intervention  Provide education, explanation, and written materials on patient's individual exercise prescription       Expected Outcomes  Short Term: Able to explain program exercise prescription;Long Term: Able to explain home exercise prescription to exercise independently          Exercise Goals Re-Evaluation: Exercise Goals Re-Evaluation    Row Name 07/02/19 0908 07/17/19 1439 07/17/19 1446 07/29/19 0853 08/14/19 1513     Exercise Goal Re-Evaluation   Exercise Goals Review  -  Increase Physical Activity;Increase Strength and Stamina;Able to understand and use rate of perceived exertion (RPE) scale;Knowledge and understanding of Target Heart Rate Range (THRR);Able to check pulse independently;Understanding of Exercise  Prescription  -  Increase Physical Activity;Increase Strength and Stamina;Understanding of Exercise Prescription  Increase Physical Activity;Increase Strength and Stamina;Understanding of Exercise Prescription   Comments  Reviewed RPE scale, THR and program prescription with pt today.  Pt voiced understanding and was given a copy of goals to take home.  Pt has been consistent two days per week since returning to [place] is doing well in rehab.  He is now attending three days a week.  Over the weekend he has been walking more.  [place] went to the [place] to hike to see the leaves.  He is feeling better overall.  Alexjandro is now attending when his work schedule allows.  He has a set time to come to class and  attends as able. We are usually able to set up his schedule a week or two in advance.  He is up to level 5 on the BioStep now.  We will continue to monitor his progress.   Expected Outcomes  Short: Use RPE daily to regulate intensity. Long: Follow program prescription in THR.  -  Short - continue to attend consistently Long - improve stamina  Short: Continue to walk on his off days.  Long: Continue to improve stamina.  Short: Attend class when able  Long: Continue to improve stamina.   Ware Name 08/28/19 1359             Exercise Goal Re-Evaluation   Comments  Out since last review          Nutrition & Weight - Outcomes: Pre Biometrics - 05/14/19 0910      Pre Biometrics   Height  5' 6.5" (1.689 m)    Weight  253 lb 4.8 oz (114.9 kg)    BMI (Calculated)  40.28    Single Leg Stand  30 seconds        Nutrition: Nutrition Therapy & Goals - 05/14/19 0918      Nutrition Therapy   Diet  Low Na HH diet    Drug/Food Interactions  Statins/Certain Fruits   lipitor   Protein (specify units)  95g    Fiber  30 grams    Whole Grain Foods  3 servings    Saturated Fats  12 max. grams    Fruits and Vegetables  5 servings/day    Sodium  1.5 grams      Personal Nutrition Goals   Nutrition  Goal  ST: reduce Na intake to 1500-2000mg  range LT: increase functional years and extend working years    Comments  Pt reports works at [employer] and 5am starting. If 330am will drink veggie juice, if 5am will drink coffee. Pt reports regardless will have B at 7am (instant oatmeal, fruit, small amount of brown sugar). L is multigrain bread with [place] cold cuts and some vegetable or fruit. D baked/grilled/sauteed ("healthy oil" his wife uses) fish or meat with wedge salad (cheese, olives, balsalmic, iceburg) or other veggies (roasted with the same oil). Pt will have yasso (frozen yogurt pop) for snack and beer (lime salt and lime wedge) or wine (~4x/week). Pt reports knowing what to do but getting back into it. Will go out to eat once per month but will have associated guilt afterwards. Pt once did diet (if high fat, low CHO - if high CHO, low fat) wants to try to move towards that. Pt will use spice mix with salt, lime/lemon, and pepper instead of conventional salt. Pt will sometimes use NoSalt, but doesn't care for it on certain things. RD noted A1c 6.2, fasting BG no higher than 121 (these are prediabetic numbers), inquired with pt because diagnosis of diabetes in chart, pt reports not having diabetes, he was told he had prediabetes. Pt reports going to sleep at 630-7pm. Pt reports taking water pill, he will try to weight himslef and check his legs more often.      Intervention Plan   Intervention  Prescribe, educate and counsel regarding individualized specific dietary modifications aiming towards targeted core components such as weight, hypertension, lipid management, diabetes, heart failure and other comorbidities.;Nutrition handout(s) given to patient.    Expected Outcomes  Short Term Goal: Understand basic principles of dietary content, such as calories, fat, sodium, cholesterol and nutrients.;Short Term Goal: A plan  has been developed with personal nutrition goals set during dietitian appointment.;Long  Term Goal: Adherence to prescribed nutrition plan.       Nutrition Discharge: Nutrition Assessments - 05/13/19 0754      MEDFICTS Scores   Pre Score  18       Education Questionnaire Score: Knowledge Questionnaire Score - 05/13/19 0754      Knowledge Questionnaire Score   Pre Score  26/26       Goals reviewed with patient; copy given to patient.

## 2019-10-07 DIAGNOSIS — I771 Stricture of artery: Secondary | ICD-10-CM | POA: Insufficient documentation

## 2019-10-09 ENCOUNTER — Ambulatory Visit: Payer: BC Managed Care – PPO | Attending: Internal Medicine

## 2019-10-09 DIAGNOSIS — Z20822 Contact with and (suspected) exposure to covid-19: Secondary | ICD-10-CM

## 2019-10-10 LAB — NOVEL CORONAVIRUS, NAA: SARS-CoV-2, NAA: NOT DETECTED

## 2019-10-25 ENCOUNTER — Other Ambulatory Visit: Payer: Self-pay | Admitting: Cardiovascular Disease

## 2020-01-18 ENCOUNTER — Other Ambulatory Visit: Payer: Self-pay | Admitting: Cardiovascular Disease

## 2020-01-20 NOTE — Telephone Encounter (Signed)
Please schedule office visit with Dr. Rockey Situ for refills. Thanks!

## 2020-01-20 NOTE — Telephone Encounter (Signed)
Attempted to schedule.  LMOV to call office.  ° °

## 2020-02-12 NOTE — Telephone Encounter (Signed)
Attempted to schedule.  LMOV to call office.  ° °

## 2020-02-17 NOTE — Telephone Encounter (Signed)
3 attempts to schedule fu appt from recall list.   Deleting recall.      Attempted to schedule.  LMOV to call office.   

## 2020-03-18 ENCOUNTER — Encounter: Payer: BC Managed Care – PPO | Attending: Internal Medicine | Admitting: *Deleted

## 2020-03-18 ENCOUNTER — Encounter: Payer: Self-pay | Admitting: *Deleted

## 2020-03-18 ENCOUNTER — Other Ambulatory Visit: Payer: Self-pay

## 2020-03-18 DIAGNOSIS — Z955 Presence of coronary angioplasty implant and graft: Secondary | ICD-10-CM | POA: Insufficient documentation

## 2020-03-18 DIAGNOSIS — Z87442 Personal history of urinary calculi: Secondary | ICD-10-CM | POA: Insufficient documentation

## 2020-03-18 DIAGNOSIS — Z7982 Long term (current) use of aspirin: Secondary | ICD-10-CM | POA: Insufficient documentation

## 2020-03-18 DIAGNOSIS — I2510 Atherosclerotic heart disease of native coronary artery without angina pectoris: Secondary | ICD-10-CM | POA: Insufficient documentation

## 2020-03-18 DIAGNOSIS — K219 Gastro-esophageal reflux disease without esophagitis: Secondary | ICD-10-CM | POA: Insufficient documentation

## 2020-03-18 DIAGNOSIS — G4730 Sleep apnea, unspecified: Secondary | ICD-10-CM | POA: Insufficient documentation

## 2020-03-18 DIAGNOSIS — Z87891 Personal history of nicotine dependence: Secondary | ICD-10-CM | POA: Insufficient documentation

## 2020-03-18 DIAGNOSIS — I10 Essential (primary) hypertension: Secondary | ICD-10-CM | POA: Insufficient documentation

## 2020-03-18 DIAGNOSIS — E119 Type 2 diabetes mellitus without complications: Secondary | ICD-10-CM | POA: Insufficient documentation

## 2020-03-18 DIAGNOSIS — M1990 Unspecified osteoarthritis, unspecified site: Secondary | ICD-10-CM | POA: Insufficient documentation

## 2020-03-18 DIAGNOSIS — Z79899 Other long term (current) drug therapy: Secondary | ICD-10-CM | POA: Insufficient documentation

## 2020-03-18 DIAGNOSIS — Z7902 Long term (current) use of antithrombotics/antiplatelets: Secondary | ICD-10-CM | POA: Insufficient documentation

## 2020-03-18 DIAGNOSIS — Z6839 Body mass index (BMI) 39.0-39.9, adult: Secondary | ICD-10-CM | POA: Insufficient documentation

## 2020-03-18 DIAGNOSIS — E785 Hyperlipidemia, unspecified: Secondary | ICD-10-CM | POA: Insufficient documentation

## 2020-03-18 DIAGNOSIS — F329 Major depressive disorder, single episode, unspecified: Secondary | ICD-10-CM | POA: Insufficient documentation

## 2020-03-18 NOTE — Progress Notes (Signed)
Virtual Orientation completed today. Has an appt for EP Eval and gym orientation tomorrow at 8 AM. Documentation of Diagnosis can be found in [facility] 02/17/2020

## 2020-03-19 ENCOUNTER — Encounter: Payer: BC Managed Care – PPO | Admitting: *Deleted

## 2020-03-19 VITALS — Ht 67.75 in | Wt 257.1 lb

## 2020-03-19 DIAGNOSIS — Z79899 Other long term (current) drug therapy: Secondary | ICD-10-CM | POA: Diagnosis not present

## 2020-03-19 DIAGNOSIS — F329 Major depressive disorder, single episode, unspecified: Secondary | ICD-10-CM | POA: Diagnosis not present

## 2020-03-19 DIAGNOSIS — K219 Gastro-esophageal reflux disease without esophagitis: Secondary | ICD-10-CM | POA: Diagnosis not present

## 2020-03-19 DIAGNOSIS — Z7902 Long term (current) use of antithrombotics/antiplatelets: Secondary | ICD-10-CM | POA: Diagnosis not present

## 2020-03-19 DIAGNOSIS — E785 Hyperlipidemia, unspecified: Secondary | ICD-10-CM | POA: Diagnosis not present

## 2020-03-19 DIAGNOSIS — I10 Essential (primary) hypertension: Secondary | ICD-10-CM | POA: Diagnosis not present

## 2020-03-19 DIAGNOSIS — Z955 Presence of coronary angioplasty implant and graft: Secondary | ICD-10-CM | POA: Diagnosis not present

## 2020-03-19 DIAGNOSIS — Z6839 Body mass index (BMI) 39.0-39.9, adult: Secondary | ICD-10-CM | POA: Diagnosis not present

## 2020-03-19 DIAGNOSIS — Z87891 Personal history of nicotine dependence: Secondary | ICD-10-CM | POA: Diagnosis not present

## 2020-03-19 DIAGNOSIS — I2510 Atherosclerotic heart disease of native coronary artery without angina pectoris: Secondary | ICD-10-CM | POA: Diagnosis not present

## 2020-03-19 DIAGNOSIS — Z87442 Personal history of urinary calculi: Secondary | ICD-10-CM | POA: Diagnosis not present

## 2020-03-19 DIAGNOSIS — E119 Type 2 diabetes mellitus without complications: Secondary | ICD-10-CM | POA: Diagnosis not present

## 2020-03-19 DIAGNOSIS — G4730 Sleep apnea, unspecified: Secondary | ICD-10-CM | POA: Diagnosis not present

## 2020-03-19 DIAGNOSIS — M1990 Unspecified osteoarthritis, unspecified site: Secondary | ICD-10-CM | POA: Diagnosis not present

## 2020-03-19 DIAGNOSIS — Z7982 Long term (current) use of aspirin: Secondary | ICD-10-CM | POA: Diagnosis not present

## 2020-03-19 NOTE — Progress Notes (Signed)
Cardiac Individual Treatment Plan  Patient Details  Name: Brandon Henderson MRN: [id number] Date of Birth: 09/01/1956 Referring Provider:     Cardiac Rehab from 03/19/2020 in [facility] Cardiac and Pulmonary Rehab  Referring Provider  Frazier Richards MD      Initial Encounter Date:    Cardiac Rehab from 03/19/2020 in [facility] Cardiac and Pulmonary Rehab  Date  03/19/20      Visit Diagnosis: Status post coronary artery stent placement  Patient's Home Medications on Admission:  Current Outpatient Medications:    Ascorbic Acid (VITAMIN C) 1000 MG tablet, Take 1,000 mg by mouth at bedtime. , Disp: , Rfl:    aspirin EC 81 MG tablet, Take 81 mg by mouth at bedtime. , Disp: , Rfl:    atorvastatin (LIPITOR) 40 MG tablet, Take 1 tablet (40 mg total) by mouth daily. Please call to schedule an appointment for further refills., Disp: 90 tablet, Rfl: 0   clopidogrel (PLAVIX) 75 MG tablet, Take 1 tablet (75 mg total) by mouth daily with breakfast. (Patient taking differently: Take 75 mg by mouth at bedtime. ), Disp: 90 tablet, Rfl: 3   Coenzyme Q10 (COQ10) 200 MG CAPS, Take 200 mg by mouth at bedtime., Disp: , Rfl:    diphenhydrAMINE (BENADRYL) 50 MG tablet, Take 1 tablet (50 mg) by mouth prior to leaving home to your procedure with a small sip of water., Disp: 1 tablet, Rfl: 0   escitalopram (LEXAPRO) 10 MG tablet, Take 10 mg by mouth at bedtime. , Disp: , Rfl:    ezetimibe (ZETIA) 10 MG tablet, Take 10 mg by mouth at bedtime. , Disp: , Rfl:    furosemide (LASIX) 40 MG tablet, Take 1 tablet (40 mg total) by mouth daily., Disp: 30 tablet, Rfl: 3   Glucosamine-Chondroitin (COSAMIN DS PO), Take 2 tablets by mouth at bedtime. , Disp: , Rfl:    isosorbide mononitrate (IMDUR) 30 MG 24 hr tablet, Take 1 tablet (30 mg total) by mouth daily. (Patient taking differently: Take 30 mg by mouth at bedtime. ), Disp: 90 tablet, Rfl: 3   magnesium oxide (MAG-OX) 400 MG tablet, Take 400 mg by mouth at bedtime.,  Disp: , Rfl:    metoprolol tartrate (LOPRESSOR) 25 MG tablet, Take 0.5 tablets (12.5 mg total) by mouth 2 (two) times daily., Disp: 90 tablet, Rfl: 3   Multiple Vitamin (MULTIVITAMIN WITH MINERALS) TABS tablet, Take 1 tablet by mouth at bedtime., Disp: , Rfl:    nitroGLYCERIN (NITROSTAT) 0.4 MG SL tablet, Place 1 tablet (0.4 mg total) under the tongue every 5 (five) minutes as needed for chest pain., Disp: 90 tablet, Rfl: 3   pantoprazole (PROTONIX) 40 MG tablet, Take 40 mg by mouth at bedtime. , Disp: , Rfl:    Psyllium (METAMUCIL PO), Take 1 Dose by mouth at bedtime., Disp: , Rfl:    ramipril (ALTACE) 5 MG capsule, Take 1 capsule (5 mg total) by mouth daily. Please call to schedule appointment for further refills., Disp: 90 capsule, Rfl: 0   ranolazine (RANEXA) 500 MG 12 hr tablet, Take 1 tablet (500 mg total) by mouth 2 (two) times daily., Disp: 60 tablet, Rfl: 11  Past Medical History: Past Medical History:  Diagnosis Date   Anginal pain ([place])    Arthritis    Barrett esophagus    Carotid arterial disease ([place])    a. 2011 s/p R CEA.   Coronary artery disease    a. 2000 s/p PCI/BMS x 2 of OM2 [name], [place]);  b. 12/2018 MV: EF 43% (likely 2/2 GI uptake artifact No ischemia. Low risk; c. 03/2019 PCI: LM nl, LAD 46m LCX nl, OM2 90 ISR, RCA 40p, 829m3.5x15 Resolute Onyx DES), RPL2 70. d. 06/04/2019 LHC 1. severe OM2 (suspected in stent restenosis), mild to mod dz LAD & RCA, patent mRCA stent, nl LV pressure, balloon angio to OM2 90p to 40p   Depression    Diabetes mellitus without complication (HCC)    Diastasis recti    GERD (gastroesophageal reflux disease)    History of hiatal hernia    History of kidney stones    History of tobacco abuse    Hyperlipidemia LDL goal <70    Hypertension    Morbid obesity ([place]   Osteoarthritis    Sleep apnea     Tobacco Use: Social History   Tobacco Use  Smoking Status Former Smoker   Types: Cigarettes, E-cigarettes    Quit date: 10/17/2018   Years since quitting: 1.4  Smokeless Tobacco Never Used  Tobacco Comment   quit vaping 10/2018. quit smoking 2016    Labs: Recent Review Flowsheet Data    Labs for ITP Cardiac and Pulmonary Rehab Latest Ref Rng & Units 01/01/2019   Cholestrol 100 - 199 mg/dL 151   LDLCALC 0 - 99 mg/dL 76   HDL >39 mg/dL 44   Trlycerides 0 - 149 mg/dL 154(H)   Hemoglobin A1c 4.8 - 5.6 % 6.2(H)       Exercise Target Goals: Exercise Program Goal: Individual exercise prescription set using results from initial 6 min walk test and THRR while considering  patients activity barriers and safety.   Exercise Prescription Goal: Initial exercise prescription builds to 30-45 minutes a day of aerobic activity, 2-3 days per week.  Home exercise guidelines will be given to patient during program as part of exercise prescription that the participant will acknowledge.   Education: Aerobic Exercise & Resistance Training: - Gives group verbal and written instruction on the various components of exercise. Focuses on aerobic and resistive training programs and the benefits of this training and how to safely progress through these programs..   Education: Exercise & Equipment Safety: - Individual verbal instruction and demonstration of equipment use and safety with use of the equipment.   [facility]  Date  03/19/20  [facility]  [facility]  1- Verbalizes Understanding      Education: Exercise Physiology & General Exercise Guidelines: - Group verbal and written instruction with models to review the exercise physiology of the cardiovascular system and associated critical values. Provides general exercise guidelines with specific guidelines to those with heart or lung disease.    Education: Flexibility, Balance, Mind/Body Relaxation: Provides group verbal/written instruction on the benefits of flexibility and balance training,  including mind/body exercise modes such as yoga, pilates and tai chi.  Demonstration and skill practice provided.   Activity Barriers & Risk Stratification: Activity Barriers & Cardiac Risk Stratification - 03/19/20 0929      Activity Barriers & Cardiac Risk Stratification   Activity Barriers  Joint Problems;Other (comment);Back Problems;Chest Pain/Angina;Shortness of Breath;Balance Concerns;Deconditioning;Muscular Weakness    Comments  Right knee  needs replacement, back with spinal stenosis    Cardiac Risk Stratification  High       6 Minute Walk: 6 Minute Walk    Row Name 03/19/20 0926         6 Minute Walk   Phase  Initial  Distance  668 feet     Walk Time  2.75 minutes     # of Rest Breaks  0 stopped due to chest pain     MPH  2.76     METS  2.06     RPE  17     Perceived Dyspnea   3     VO2 Peak  7.19     Symptoms  Yes (comment)     Comments  chest pain coming in from car 2/10, chest pain with walk test 5/10 with hypertensive response, lightheaded, headache     Resting HR  62 bpm     Resting BP  144/82     Resting Oxygen Saturation   99 %     Exercise Oxygen Saturation  during 6 min walk  96 %     Max Ex. HR  104 bpm     Max Ex. BP  214/98     2 Minute Post BP  206/94 rck 156/80        Oxygen Initial Assessment:   Oxygen Re-Evaluation:   Oxygen Discharge (Final Oxygen Re-Evaluation):   Initial Exercise Prescription: Initial Exercise Prescription - 03/19/20 1000      Date of Initial Exercise RX and Referring Provider   Date  03/19/20    Referring Provider  Frazier Richards MD      Treadmill   MPH  2    Grade  0.5    Minutes  15    METs  2.67      NuStep   Level  1    SPM  80    Minutes  15    METs  2      REL-XR   Level  2    Speed  50    Minutes  15    METs  2      Biostep-RELP   Level  1    SPM  50    Minutes  15    METs  2      Prescription Details   Frequency (times per week)  2    Duration  Progress to 30 minutes of  continuous aerobic without signs/symptoms of physical distress      Intensity   THRR 40-80% of Max Heartrate  99-136    Ratings of Perceived Exertion  11-13    Perceived Dyspnea  0-4      Progression   Progression  Continue to progress workloads to maintain intensity without signs/symptoms of physical distress.      Resistance Training   Training Prescription  Yes    Weight  3 lb    Reps  10-15       Perform Capillary Blood Glucose checks as needed.  Exercise Prescription Changes: Exercise Prescription Changes    Row Name 03/19/20 1000             Response to Exercise   Blood Pressure (Admit)  144/82       Blood Pressure (Exercise)  214/98 202/94, 156/80       Blood Pressure (Exit)  136/80       Heart Rate (Admit)  62 bpm       Heart Rate (Exercise)  104 bpm       Heart Rate (Exit)  68 bpm       Oxygen Saturation (Admit)  99 %       Oxygen Saturation (Exercise)  98 %       Rating of  Perceived Exertion (Exercise)  17       Perceived Dyspnea (Exercise)  3       Symptoms  chest pain (2/10 on arrival) (5/10 walk test), lightheaded, headache       Comments  walk test results          Exercise Comments:   Exercise Goals and Review: Exercise Goals    Row Name 03/19/20 1036             Exercise Goals   Increase Physical Activity  Yes       Intervention  Provide advice, education, support and counseling about physical activity/exercise needs.;Develop an individualized exercise prescription for aerobic and resistive training based on initial evaluation findings, risk stratification, comorbidities and participant's personal goals.       Expected Outcomes  Short Term: Attend rehab on a regular basis to increase amount of physical activity.;Long Term: Add in home exercise to make exercise part of routine and to increase amount of physical activity.;Long Term: Exercising regularly at least 3-5 days a week.       Increase Strength and Stamina  Yes       Intervention   Provide advice, education, support and counseling about physical activity/exercise needs.;Develop an individualized exercise prescription for aerobic and resistive training based on initial evaluation findings, risk stratification, comorbidities and participant's personal goals.       Expected Outcomes  Short Term: Increase workloads from initial exercise prescription for resistance, speed, and METs.;Short Term: Perform resistance training exercises routinely during rehab and add in resistance training at home;Long Term: Improve cardiorespiratory fitness, muscular endurance and strength as measured by increased METs and functional capacity (6MWT)       Able to understand and use rate of perceived exertion (RPE) scale  Yes       Intervention  Provide education and explanation on how to use RPE scale       Expected Outcomes  Short Term: Able to use RPE daily in rehab to express subjective intensity level;Long Term:  Able to use RPE to guide intensity level when exercising independently       Able to understand and use Dyspnea scale  Yes       Intervention  Provide education and explanation on how to use Dyspnea scale       Expected Outcomes  Short Term: Able to use Dyspnea scale daily in rehab to express subjective sense of shortness of breath during exertion;Long Term: Able to use Dyspnea scale to guide intensity level when exercising independently       Knowledge and understanding of Target Heart Rate Range (THRR)  Yes       Intervention  Provide education and explanation of THRR including how the numbers were predicted and where they are located for reference       Expected Outcomes  Short Term: Able to use daily as guideline for intensity in rehab;Long Term: Able to use THRR to govern intensity when exercising independently;Short Term: Able to state/look up THRR       Able to check pulse independently  Yes       Intervention  Provide education and demonstration on how to check pulse in carotid and  radial arteries.;Review the importance of being able to check your own pulse for safety during independent exercise       Expected Outcomes  Short Term: Able to explain why pulse checking is important during independent exercise;Long Term: Able to check pulse independently and accurately  Understanding of Exercise Prescription  Yes       Intervention  Provide education, explanation, and written materials on patient's individual exercise prescription       Expected Outcomes  Short Term: Able to explain program exercise prescription;Long Term: Able to explain home exercise prescription to exercise independently          Exercise Goals Re-Evaluation :   Discharge Exercise Prescription (Final Exercise Prescription Changes): Exercise Prescription Changes - 03/19/20 1000      Response to Exercise   Blood Pressure (Admit)  144/82    Blood Pressure (Exercise)  214/98   202/94, 156/80   Blood Pressure (Exit)  136/80    Heart Rate (Admit)  62 bpm    Heart Rate (Exercise)  104 bpm    Heart Rate (Exit)  68 bpm    Oxygen Saturation (Admit)  99 %    Oxygen Saturation (Exercise)  98 %    Rating of Perceived Exertion (Exercise)  17    Perceived Dyspnea (Exercise)  3    Symptoms  chest pain (2/10 on arrival) (5/10 walk test), lightheaded, headache    Comments  walk test results       Nutrition:  Target Goals: Understanding of nutrition guidelines, daily intake of sodium <1596m, cholesterol <2036m calories 30% from fat and 7% or less from saturated fats, daily to have 5 or more servings of fruits and vegetables.  Education: Controlling Sodium/Reading Food Labels -Group verbal and written material supporting the discussion of sodium use in heart healthy nutrition. Review and explanation with models, verbal and written materials for utilization of the food label.   Education: General Nutrition Guidelines/Fats and Fiber: -Group instruction provided by verbal, written material, models and  posters to present the general guidelines for heart healthy nutrition. Gives an explanation and review of dietary fats and fiber.   Biometrics: Pre Biometrics - 03/19/20 1038      Pre Biometrics   Height  5' 7.75" (1.721 m)    Weight  257 lb 1.6 oz (116.6 kg)    BMI (Calculated)  39.37    Single Leg Stand  30 seconds        Nutrition Therapy Plan and Nutrition Goals:   Nutrition Assessments: Nutrition Assessments - 03/19/20 1040      MEDFICTS Scores   Pre Score  23       MEDIFICTS Score Key:          ?70 Need to make dietary changes          40-70 Heart Healthy Diet         ? 40 Therapeutic Level Cholesterol Diet  Nutrition Goals Re-Evaluation:   Nutrition Goals Discharge (Final Nutrition Goals Re-Evaluation):   Psychosocial: Target Goals: Acknowledge presence or absence of significant depression and/or stress, maximize coping skills, provide positive support system. Participant is able to verbalize types and ability to use techniques and skills needed for reducing stress and depression.   Education: Depression - Provides group verbal and written instruction on the correlation between heart/lung disease and depressed mood, treatment options, and the stigmas associated with seeking treatment.   Education: Sleep Hygiene -Provides group verbal and written instruction about how sleep can affect your health.  Define sleep hygiene, discuss sleep cycles and impact of sleep habits. Review good sleep hygiene tips.     Education: Stress and Anxiety: - Provides group verbal and written instruction about the health risks of elevated stress and causes of high stress.  Discuss the  correlation between heart/lung disease and anxiety and treatment options. Review healthy ways to manage with stress and anxiety.    Initial Review & Psychosocial Screening: Initial Psych Review & Screening - 03/18/20 1116      Initial Review   Current issues with  Current Stress Concerns;Current  Psychotropic Meds    Source of Stress Concerns  Chronic Illness    Comments  Stress of the illness and has increased Lexapro recently.      Family Dynamics   Good Support System?  Yes   Wife, family     Barriers   Psychosocial barriers to participate in program  There are no identifiable barriers or psychosocial needs.;The patient should benefit from training in stress management and relaxation.      Screening Interventions   Interventions  Encouraged to exercise;Provide feedback about the scores to participant;To provide support and resources with identified psychosocial needs    Expected Outcomes  Short Term goal: Utilizing psychosocial counselor, staff and physician to assist with identification of specific Stressors or current issues interfering with healing process. Setting desired goal for each stressor or current issue identified.;Long Term Goal: Stressors or current issues are controlled or eliminated.;Short Term goal: Identification and review with participant of any Quality of Life or Depression concerns found by scoring the questionnaire.;Long Term goal: The participant improves quality of Life and PHQ9 Scores as seen by post scores and/or verbalization of changes       Quality of Life Scores:  Quality of Life - 03/19/20 1039      Quality of Life   Select  Quality of Life      Quality of Life Scores   Health/Function Pre  20.9 %    Socioeconomic Pre  22.87 %    Psych/Spiritual Pre  21.14 %    Family Pre  29.5 %    GLOBAL Pre  22.46 %      Scores of 19 and below usually indicate a poorer quality of life in these areas.  A difference of  2-3 points is a clinically meaningful difference.  A difference of 2-3 points in the total score of the Quality of Life Index has been associated with significant improvement in overall quality of life, self-image, physical symptoms, and general health in studies assessing change in quality of life.  PHQ-9: Recent Review Flowsheet Data     Depression screen [facility] 2/9 03/19/2020 05/14/2019   Decreased Interest 1 1   Down, Depressed, Hopeless 1 0   PHQ - 2 Score 2 1   Altered sleeping 0 0   Tired, decreased energy 1 2   Change in appetite 1 0   Feeling bad or failure about yourself  0 0   Trouble concentrating 0 0   Moving slowly or fidgety/restless 0 0   Suicidal thoughts 0 0   PHQ-9 Score 4 3   Difficult doing work/chores Not difficult at all Not difficult at all     Interpretation of Total Score  Total Score Depression Severity:  1-4 = Minimal depression, 5-9 = Mild depression, 10-14 = Moderate depression, 15-19 = Moderately severe depression, 20-27 = Severe depression   Psychosocial Evaluation and Intervention: Psychosocial Evaluation - 03/18/20 1132      Psychosocial Evaluation & Interventions   Interventions  Encouraged to exercise with the program and follow exercise prescription;Relaxation education;Stress management education    Comments  Roberthas no barriers to entering the program.  He has stress concerns with his chronic symptoms and BP elevations.  He recently had his Lexapro dose increased from 10 to 20 mg and stated that it is helping with the anxiety of his uncontrolled BP. He also stated that his cardiologist recently told him  that they will continue to search for a reason why his BP goes so high without warning. He was encouraged to hear that his doctor has not stopped trying to figure out what is causing his health issues.  Trig lives in his home with his wife. He has a great support system with his wife and family and church family. He has not retired yet, and is presently  on short term disabilty to soon return to work. Dak should do well with the program .    Expected Outcomes  STG: Braxxton will be able to attend on consistent basis and manage to work on equipment that will not aggravate his knee or back problems.    LTG: Lamarius will be able to continue the lifestyle changes he learned and started in class.     Continue Psychosocial Services   Follow up required by staff       Psychosocial Re-Evaluation:   Psychosocial Discharge (Final Psychosocial Re-Evaluation):   Vocational Rehabilitation: Provide vocational rehab assistance to qualifying candidates.   Vocational Rehab Evaluation & Intervention: Vocational Rehab - 03/18/20 1119      Initial Vocational Rehab Evaluation & Intervention   Assessment shows need for Vocational Rehabilitation  No       Education: Education Goals: Education classes will be provided on a variety of topics geared toward better understanding of heart health and risk factor modification. Participant will state understanding/return demonstration of topics presented as noted by education test scores.  Learning Barriers/Preferences: Learning Barriers/Preferences - 03/18/20 1119      Learning Barriers/Preferences   Learning Barriers  None    Learning Preferences  None       General Cardiac Education Topics:  AED/CPR: - Group verbal and written instruction with the use of models to demonstrate the basic use of the AED with the basic ABC's of resuscitation.   Anatomy & Physiology of the Heart: - Group verbal and written instruction and models provide basic cardiac anatomy and physiology, with the coronary electrical and arterial systems. Review of Valvular disease and Heart Failure   Cardiac Procedures: - Group verbal and written instruction to review commonly prescribed medications for heart disease. Reviews the medication, class of the drug, and side effects. Includes the steps to properly store meds and maintain the prescription regimen. (beta blockers and nitrates)   Cardiac Medications I: - Group verbal and written instruction to review commonly prescribed medications for heart disease. Reviews the medication, class of the drug, and side effects. Includes the steps to properly store meds and maintain the prescription regimen.   Cardiac  Medications II: -Group verbal and written instruction to review commonly prescribed medications for heart disease. Reviews the medication, class of the drug, and side effects. (all other drug classes)    Go Sex-Intimacy & Heart Disease, Get SMART - Goal Setting: - Group verbal and written instruction through game format to discuss heart disease and the return to sexual intimacy. Provides group verbal and written material to discuss and apply goal setting through the application of the S.M.A.R.T. Method.   Other Matters of the Heart: - Provides group verbal, written materials and models to describe Stable Angina and Peripheral Artery. Includes description of the disease process and treatment options available to the cardiac patient.   Infection Prevention: -  Provides verbal and written material to individual with discussion of infection control including proper hand washing and proper equipment cleaning during exercise session.   Cardiac Rehab from 03/19/2020 in [facility] Cardiac and Pulmonary Rehab  Date  03/19/20  Educator  [facility]  Instruction Review Code  1- Verbalizes Understanding      Falls Prevention: - Provides verbal and written material to individual with discussion of falls prevention and safety.   Cardiac Rehab from 03/19/2020 in [facility] Cardiac and Pulmonary Rehab  Date  03/19/20  Educator  [facility]  Instruction Review Code  1- Verbalizes Understanding      Other: -Provides group and verbal instruction on various topics (see comments)   Knowledge Questionnaire Score: Knowledge Questionnaire Score - 03/19/20 1040      Knowledge Questionnaire Score   Pre Score  23/26 Education Focus: Nutrition and Angina       Core Components/Risk Factors/Patient Goals at Admission: Personal Goals and Risk Factors at Admission - 03/19/20 1040      Core Components/Risk Factors/Patient Goals on Admission    Weight Management  Yes;Weight Loss;Obesity    Intervention  Weight Management: Develop a  combined nutrition and exercise program designed to reach desired caloric intake, while maintaining appropriate intake of nutrient and fiber, sodium and fats, and appropriate energy expenditure required for the weight goal.;Weight Management: Provide education and appropriate resources to help participant work on and attain dietary goals.;Obesity: Provide education and appropriate resources to help participant work on and attain dietary goals.;Weight Management/Obesity: Establish reasonable short term and long term weight goals.    Admit Weight  257 lb 1.6 oz (116.6 kg)    Goal Weight: Short Term  252 lb (114.3 kg)    Goal Weight: Long Term  185 lb (83.9 kg)    Expected Outcomes  Short Term: Continue to assess and modify interventions until short term weight is achieved;Long Term: Adherence to nutrition and physical activity/exercise program aimed toward attainment of established weight goal;Weight Loss: Understanding of general recommendations for a balanced deficit meal plan, which promotes 1-2 lb weight loss per week and includes a negative energy balance of [phone] kcal/d;Understanding recommendations for meals to include 15-35% energy as protein, 25-35% energy from fat, 35-60% energy from carbohydrates, less than 246m of dietary cholesterol, 20-35 gm of total fiber daily;Understanding of distribution of calorie intake throughout the day with the consumption of 4-5 meals/snacks    Diabetes  Yes    Intervention  Provide education about signs/symptoms and action to take for hypo/hyperglycemia.;Provide education about proper nutrition, including hydration, and aerobic/resistive exercise prescription along with prescribed medications to achieve blood glucose in normal ranges: Fasting glucose 65-99 mg/dL    Expected Outcomes  Short Term: Participant verbalizes understanding of the signs/symptoms and immediate care of hyper/hypoglycemia, proper foot care and importance of medication, aerobic/resistive  exercise and nutrition plan for blood glucose control.;Long Term: Attainment of HbA1C < 7%.    Hypertension  Yes    Intervention  Provide education on lifestyle modifcations including regular physical activity/exercise, weight management, moderate sodium restriction and increased consumption of fresh fruit, vegetables, and low fat dairy, alcohol moderation, and smoking cessation.;Monitor prescription use compliance.    Expected Outcomes  Short Term: Continued assessment and intervention until BP is < 140/930mHG in hypertensive participants. < 130/8050mG in hypertensive participants with diabetes, heart failure or chronic kidney disease.;Long Term: Maintenance of blood pressure at goal levels.    Lipids  Yes    Intervention  Provide education and support  for participant on nutrition & aerobic/resistive exercise along with prescribed medications to achieve LDL <39m, HDL >465m    Expected Outcomes  Short Term: Participant states understanding of desired cholesterol values and is compliant with medications prescribed. Participant is following exercise prescription and nutrition guidelines.;Long Term: Cholesterol controlled with medications as prescribed, with individualized exercise RX and with personalized nutrition plan. Value goals: LDL < 7049mHDL > 40 mg.       Education:Diabetes - Individual verbal and written instruction to review signs/symptoms of diabetes, desired ranges of glucose level fasting, after meals and with exercise. Acknowledge that pre and post exercise glucose checks will be done for 3 sessions at entry of program.   [facility]  Date  03/19/20  Educator  [facility] Code  1- Verbalizes Understanding      Education: Know Your Numbers and Risk Factors: -Group verbal and written instruction about important numbers in your health.  Discussion of what are risk factors and how they play a role in the disease process.   Review of Cholesterol, Blood Pressure, Diabetes, and BMI and the role they play in your overall health.   Core Components/Risk Factors/Patient Goals Review:    Core Components/Risk Factors/Patient Goals at Discharge (Final Review):    ITP Comments: ITP Comments    Row Name 03/18/20 1129 03/19/20 0926         ITP Comments  Virtual Orientation completed today. Has an appt for EP Eval and gym orientation tomorrow at 8 AM. [facility]  Completed 6MWT and gym orientation.  Initial ITP created and sent for review to Dr. MarEmily Filbertedical Director.         Comments: Initial ITP

## 2020-03-19 NOTE — Patient Instructions (Signed)
Patient Instructions  Patient Details  Name: Brandon Henderson MRN: [id number] Date of Birth: January 24, 1956 Referring Provider:  Kirk Ruths, MD  Below are your personal goals for exercise, nutrition, and risk factors. Our goal is to help you stay on track towards obtaining and maintaining these goals. We will be discussing your progress on these goals with you throughout the program.  Initial Exercise Prescription: Initial Exercise Prescription - 03/19/20 1000      Date of Initial Exercise RX and Referring Provider   Date  03/19/20    Referring Provider  Frazier Richards MD      Treadmill   MPH  2    Grade  0.5    Minutes  15    METs  2.67      NuStep   Level  1    SPM  80    Minutes  15    METs  2      REL-XR   Level  2    Speed  50    Minutes  15    METs  2      Biostep-RELP   Level  1    SPM  50    Minutes  15    METs  2      Prescription Details   Frequency (times per week)  2    Duration  Progress to 30 minutes of continuous aerobic without signs/symptoms of physical distress      Intensity   THRR 40-80% of Max Heartrate  99-136    Ratings of Perceived Exertion  11-13    Perceived Dyspnea  0-4      Progression   Progression  Continue to progress workloads to maintain intensity without signs/symptoms of physical distress.      Resistance Training   Training Prescription  Yes    Weight  3 lb    Reps  10-15       Exercise Goals: Frequency: Be able to perform aerobic exercise two to three times per week in program working toward 2-5 days per week of home exercise.  Intensity: Work with a perceived exertion of 11 (fairly light) - 15 (hard) while following your exercise prescription.  We will make changes to your prescription with you as you progress through the program.   Duration: Be able to do 30 to 45 minutes of continuous aerobic exercise in addition to a 5 minute warm-up and a 5 minute cool-down routine.   Nutrition Goals: Your  personal nutrition goals will be established when you do your nutrition analysis with the dietician.  The following are general nutrition guidelines to follow: Cholesterol < 200mg /day Sodium < 1500mg /day Fiber: Men over 50 yrs - 30 grams per day  Personal Goals: Personal Goals and Risk Factors at Admission - 03/19/20 1040      Core Components/Risk Factors/Patient Goals on Admission    Weight Management  Yes;Weight Loss;Obesity    Intervention  Weight Management: Develop a combined nutrition and exercise program designed to reach desired caloric intake, while maintaining appropriate intake of nutrient and fiber, sodium and fats, and appropriate energy expenditure required for the weight goal.;Weight Management: Provide education and appropriate resources to help participant work on and attain dietary goals.;Obesity: Provide education and appropriate resources to help participant work on and attain dietary goals.;Weight Management/Obesity: Establish reasonable short term and long term weight goals.    Admit Weight  257 lb 1.6 oz (116.6 kg)    Goal Weight: Short Term  252 lb (114.3 kg)    Goal Weight: Long Term  185 lb (83.9 kg)    Expected Outcomes  Short Term: Continue to assess and modify interventions until short term weight is achieved;Long Term: Adherence to nutrition and physical activity/exercise program aimed toward attainment of established weight goal;Weight Loss: Understanding of general recommendations for a balanced deficit meal plan, which promotes 1-2 lb weight loss per week and includes a negative energy balance of [phone] kcal/d;Understanding recommendations for meals to include 15-35% energy as protein, 25-35% energy from fat, 35-60% energy from carbohydrates, less than 200mg  of dietary cholesterol, 20-35 gm of total fiber daily;Understanding of distribution of calorie intake throughout the day with the consumption of 4-5 meals/snacks    Diabetes  Yes    Intervention  Provide  education about signs/symptoms and action to take for hypo/hyperglycemia.;Provide education about proper nutrition, including hydration, and aerobic/resistive exercise prescription along with prescribed medications to achieve blood glucose in normal ranges: Fasting glucose 65-99 mg/dL    Expected Outcomes  Short Term: Participant verbalizes understanding of the signs/symptoms and immediate care of hyper/hypoglycemia, proper foot care and importance of medication, aerobic/resistive exercise and nutrition plan for blood glucose control.;Long Term: Attainment of HbA1C < 7%.    Hypertension  Yes    Intervention  Provide education on lifestyle modifcations including regular physical activity/exercise, weight management, moderate sodium restriction and increased consumption of fresh fruit, vegetables, and low fat dairy, alcohol moderation, and smoking cessation.;Monitor prescription use compliance.    Expected Outcomes  Short Term: Continued assessment and intervention until BP is < 140/40mm HG in hypertensive participants. < 130/24mm HG in hypertensive participants with diabetes, heart failure or chronic kidney disease.;Long Term: Maintenance of blood pressure at goal levels.    Lipids  Yes    Intervention  Provide education and support for participant on nutrition & aerobic/resistive exercise along with prescribed medications to achieve LDL 70mg , HDL >40mg .    Expected Outcomes  Short Term: Participant states understanding of desired cholesterol values and is compliant with medications prescribed. Participant is following exercise prescription and nutrition guidelines.;Long Term: Cholesterol controlled with medications as prescribed, with individualized exercise RX and with personalized nutrition plan. Value goals: LDL < 70mg , HDL > 40 mg.       Tobacco Use Initial Evaluation: Social History   Tobacco Use  Smoking Status Former Smoker  . Types: Cigarettes, E-cigarettes  . Quit date: 10/17/2018  . Years  since quitting: 1.4  Smokeless Tobacco Never Used  Tobacco Comment   quit vaping 10/2018. quit smoking 2016    Exercise Goals and Review: Exercise Goals    Row Name 03/19/20 1036             Exercise Goals   Increase Physical Activity  Yes       Intervention  Provide advice, education, support and counseling about physical activity/exercise needs.;Develop an individualized exercise prescription for aerobic and resistive training based on initial evaluation findings, risk stratification, comorbidities and participant's personal goals.       Expected Outcomes  Short Term: Attend rehab on a regular basis to increase amount of physical activity.;Long Term: Add in home exercise to make exercise part of routine and to increase amount of physical activity.;Long Term: Exercising regularly at least 3-5 days a week.       Increase Strength and Stamina  Yes       Intervention  Provide advice, education, support and counseling about physical activity/exercise needs.;Develop an individualized exercise prescription for aerobic and  resistive training based on initial evaluation findings, risk stratification, comorbidities and participant's personal goals.       Expected Outcomes  Short Term: Increase workloads from initial exercise prescription for resistance, speed, and METs.;Short Term: Perform resistance training exercises routinely during rehab and add in resistance training at home;Long Term: Improve cardiorespiratory fitness, muscular endurance and strength as measured by increased METs and functional capacity (6MWT)       Able to understand and use rate of perceived exertion (RPE) scale  Yes       Intervention  Provide education and explanation on how to use RPE scale       Expected Outcomes  Short Term: Able to use RPE daily in rehab to express subjective intensity level;Long Term:  Able to use RPE to guide intensity level when exercising independently       Able to understand and use Dyspnea scale   Yes       Intervention  Provide education and explanation on how to use Dyspnea scale       Expected Outcomes  Short Term: Able to use Dyspnea scale daily in rehab to express subjective sense of shortness of breath during exertion;Long Term: Able to use Dyspnea scale to guide intensity level when exercising independently       Knowledge and understanding of Target Heart Rate Range (THRR)  Yes       Intervention  Provide education and explanation of THRR including how the numbers were predicted and where they are located for reference       Expected Outcomes  Short Term: Able to use daily as guideline for intensity in rehab;Long Term: Able to use THRR to govern intensity when exercising independently;Short Term: Able to state/look up THRR       Able to check pulse independently  Yes       Intervention  Provide education and demonstration on how to check pulse in carotid and radial arteries.;Review the importance of being able to check your own pulse for safety during independent exercise       Expected Outcomes  Short Term: Able to explain why pulse checking is important during independent exercise;Long Term: Able to check pulse independently and accurately       Understanding of Exercise Prescription  Yes       Intervention  Provide education, explanation, and written materials on patient's individual exercise prescription       Expected Outcomes  Short Term: Able to explain program exercise prescription;Long Term: Able to explain home exercise prescription to exercise independently          Copy of goals given to participant.

## 2020-03-24 ENCOUNTER — Encounter: Payer: BC Managed Care – PPO | Admitting: *Deleted

## 2020-03-24 ENCOUNTER — Other Ambulatory Visit: Payer: Self-pay

## 2020-03-24 DIAGNOSIS — Z955 Presence of coronary angioplasty implant and graft: Secondary | ICD-10-CM

## 2020-03-24 LAB — GLUCOSE, CAPILLARY
Glucose-Capillary: 154 mg/dL — ABNORMAL HIGH (ref 70–99)
Glucose-Capillary: 97 mg/dL (ref 70–99)

## 2020-03-24 NOTE — Progress Notes (Signed)
Daily Session Note  Patient Details  Name: Brandon Henderson MRN: [id number] Date of Birth: 01-Aug-1956 Referring Provider:     Cardiac Rehab from 03/19/2020 in [facility] Cardiac and Pulmonary Rehab  Referring Provider  Frazier Richards MD      Encounter Date: 03/24/2020  Check In: Session Check In - 03/24/20 0852      Check-In   Supervising physician immediately available to respond to emergencies  See telemetry face sheet for immediately available ER MD    Location  [facility]-Cardiac & Pulmonary Rehab    Staff Present  Heath Lark, RN, BSN, CCRP;Melissa Caiola RDN, LDN;Joseph Hood RCP,RRT,BSRT    Virtual Visit  No    Medication changes reported      No    Fall or balance concerns reported     No    Warm-up and Cool-down  Performed on first and last piece of equipment    Resistance Training Performed  Yes    VAD Patient?  No    PAD/SET Patient?  No      Pain Assessment   Currently in Pain?  No/denies          Social History   Tobacco Use  Smoking Status Former Smoker  . Types: Cigarettes, E-cigarettes  . Quit date: 10/17/2018  . Years since quitting: 1.4  Smokeless Tobacco Never Used  Tobacco Comment   quit vaping 10/2018. quit smoking 2016    Goals Met:  Exercise tolerated well Personal goals reviewed No report of cardiac concerns or symptoms  Goals Unmet:  Not Applicable  Comments: Pt able to follow exercise prescription today without complaint.  Will continue to monitor for progression. First full day of exercise!  Patient was oriented to gym and equipment including functions, settings, policies, and procedures.  Patient's individual exercise prescription and treatment plan were reviewed.  All starting workloads were established based on the results of the 6 minute walk test done at initial orientation visit.  The plan for exercise progression was also introduced and progression will be customized based on patient's performance and goals.    Dr. Emily Filbert is  Medical Director for [place] and [name].

## 2020-03-26 ENCOUNTER — Encounter: Payer: BC Managed Care – PPO | Admitting: *Deleted

## 2020-03-26 ENCOUNTER — Other Ambulatory Visit: Payer: Self-pay

## 2020-03-26 DIAGNOSIS — Z955 Presence of coronary angioplasty implant and graft: Secondary | ICD-10-CM | POA: Diagnosis not present

## 2020-03-26 LAB — GLUCOSE, CAPILLARY
Glucose-Capillary: 132 mg/dL — ABNORMAL HIGH (ref 70–99)
Glucose-Capillary: 104 mg/dL — ABNORMAL HIGH (ref 70–99)

## 2020-03-26 NOTE — Progress Notes (Signed)
Daily Session Note  Patient Details  Name: Brandon Henderson MRN: [id number] Date of Birth: 1956-02-19 Referring Provider:     Cardiac Rehab from 03/19/2020 in [facility] Cardiac and Pulmonary Rehab  Referring Provider Frazier Richards MD      Encounter Date: 03/26/2020  Check In:  Session Check In - 03/26/20 0950      Check-In   Supervising physician immediately available to respond to emergencies See telemetry face sheet for immediately available ER MD    Location [facility]-Cardiac & Pulmonary Rehab    Staff Present Heath Lark, RN, BSN, CCRP;Melissa [place] RDN, LDN;Jessica [place], MA, RCEP, CCRP, CCET    Virtual Visit No    Medication changes reported     No    Fall or balance concerns reported    No    Warm-up and Cool-down Performed on first and last piece of equipment    Resistance Training Performed Yes    VAD Patient? No    PAD/SET Patient? No      Pain Assessment   Currently in Pain? No/denies              Social History   Tobacco Use  Smoking Status Former Smoker  . Types: Cigarettes, E-cigarettes  . Quit date: 10/17/2018  . Years since quitting: 1.4  Smokeless Tobacco Never Used  Tobacco Comment   quit vaping 10/2018. quit smoking 2016    Goals Met:  Exercise tolerated well No report of cardiac concerns or symptoms  Goals Unmet:  Not Applicable  Comments: Pt able to follow exercise prescription today without complaint.  Will continue to monitor for progression.    Dr. Emily Henderson is Medical Director for [place] and [name].

## 2020-03-31 ENCOUNTER — Other Ambulatory Visit: Payer: Self-pay

## 2020-03-31 ENCOUNTER — Encounter: Payer: BC Managed Care – PPO | Admitting: *Deleted

## 2020-03-31 DIAGNOSIS — Z955 Presence of coronary angioplasty implant and graft: Secondary | ICD-10-CM

## 2020-03-31 LAB — GLUCOSE, CAPILLARY
Glucose-Capillary: 121 mg/dL — ABNORMAL HIGH (ref 70–99)
Glucose-Capillary: 113 mg/dL — ABNORMAL HIGH (ref 70–99)

## 2020-03-31 NOTE — Progress Notes (Signed)
Daily Session Note  Patient Details  Name: Brandon Henderson MRN: [id number] Date of Birth: 04-08-1956 Referring Provider:     Cardiac Rehab from 03/19/2020 in [facility] Cardiac and Pulmonary Rehab  Referring Provider Frazier Richards MD      Encounter Date: 03/31/2020  Check In:  Session Check In - 03/31/20 0951      Check-In   Supervising physician immediately available to respond to emergencies See telemetry face sheet for immediately available ER MD    Location [facility]-Cardiac & Pulmonary Rehab    Staff Present Heath Lark, RN, BSN, CCRP;Joseph Foy [place], [place], ACSM CEP, Exercise Physiologist    Virtual Visit No    Medication changes reported     No    Fall or balance concerns reported    No    Warm-up and Cool-down Performed on first and last piece of equipment    Resistance Training Performed Yes    VAD Patient? No    PAD/SET Patient? No      Pain Assessment   Currently in Pain? No/denies              Social History   Tobacco Use  Smoking Status Former Smoker  . Types: Cigarettes, E-cigarettes  . Quit date: 10/17/2018  . Years since quitting: 1.4  Smokeless Tobacco Never Used  Tobacco Comment   quit vaping 10/2018. quit smoking 2016    Goals Met:  Independence with exercise equipment Exercise tolerated well No report of cardiac concerns or symptoms  Goals Unmet:  Not Applicable  Comments: Pt able to follow exercise prescription today without complaint.  Will continue to monitor for progression.    Dr. Emily Filbert is Medical Director for [place] and [name].

## 2020-04-01 ENCOUNTER — Encounter: Payer: Self-pay | Admitting: *Deleted

## 2020-04-01 DIAGNOSIS — Z955 Presence of coronary angioplasty implant and graft: Secondary | ICD-10-CM

## 2020-04-01 NOTE — Progress Notes (Signed)
Cardiac Individual Treatment Plan  Patient Details  Name: Brandon Henderson MRN: [id number] Date of Birth: 24-Jan-1956 Referring Provider:     Cardiac Rehab from 03/19/2020 in [facility] Cardiac and Pulmonary Rehab  Referring Provider Frazier Richards MD      Initial Encounter Date:    Cardiac Rehab from 03/19/2020 in [facility] Cardiac and Pulmonary Rehab  Date 03/19/20      Visit Diagnosis: Status post coronary artery stent placement  Patient's Home Medications on Admission:  Current Outpatient Medications:  .  Ascorbic Acid (VITAMIN C) 1000 MG tablet, Take 1,000 mg by mouth at bedtime. , Disp: , Rfl:  .  aspirin EC 81 MG tablet, Take 81 mg by mouth at bedtime. , Disp: , Rfl:  .  atorvastatin (LIPITOR) 40 MG tablet, Take 1 tablet (40 mg total) by mouth daily. Please call to schedule an appointment for further refills., Disp: 90 tablet, Rfl: 0 .  clopidogrel (PLAVIX) 75 MG tablet, Take 1 tablet (75 mg total) by mouth daily with breakfast. (Patient taking differently: Take 75 mg by mouth at bedtime. ), Disp: 90 tablet, Rfl: 3 .  Coenzyme Q10 (COQ10) 200 MG CAPS, Take 200 mg by mouth at bedtime., Disp: , Rfl:  .  diphenhydrAMINE (BENADRYL) 50 MG tablet, Take 1 tablet (50 mg) by mouth prior to leaving home to your procedure with a small sip of water., Disp: 1 tablet, Rfl: 0 .  escitalopram (LEXAPRO) 10 MG tablet, Take 10 mg by mouth at bedtime. , Disp: , Rfl:  .  ezetimibe (ZETIA) 10 MG tablet, Take 10 mg by mouth at bedtime. , Disp: , Rfl:  .  furosemide (LASIX) 40 MG tablet, Take 1 tablet (40 mg total) by mouth daily., Disp: 30 tablet, Rfl: 3 .  Glucosamine-Chondroitin (COSAMIN DS PO), Take 2 tablets by mouth at bedtime. , Disp: , Rfl:  .  isosorbide mononitrate (IMDUR) 30 MG 24 hr tablet, Take 1 tablet (30 mg total) by mouth daily. (Patient taking differently: Take 30 mg by mouth at bedtime. ), Disp: 90 tablet, Rfl: 3 .  magnesium oxide (MAG-OX) 400 MG tablet, Take 400 mg by mouth at bedtime.,  Disp: , Rfl:  .  metoprolol tartrate (LOPRESSOR) 25 MG tablet, Take 0.5 tablets (12.5 mg total) by mouth 2 (two) times daily., Disp: 90 tablet, Rfl: 3 .  Multiple Vitamin (MULTIVITAMIN WITH MINERALS) TABS tablet, Take 1 tablet by mouth at bedtime., Disp: , Rfl:  .  nitroGLYCERIN (NITROSTAT) 0.4 MG SL tablet, Place 1 tablet (0.4 mg total) under the tongue every 5 (five) minutes as needed for chest pain., Disp: 90 tablet, Rfl: 3 .  pantoprazole (PROTONIX) 40 MG tablet, Take 40 mg by mouth at bedtime. , Disp: , Rfl:  .  Psyllium (METAMUCIL PO), Take 1 Dose by mouth at bedtime., Disp: , Rfl:  .  ramipril (ALTACE) 5 MG capsule, Take 1 capsule (5 mg total) by mouth daily. Please call to schedule appointment for further refills., Disp: 90 capsule, Rfl: 0 .  ranolazine (RANEXA) 500 MG 12 hr tablet, Take 1 tablet (500 mg total) by mouth 2 (two) times daily., Disp: 60 tablet, Rfl: 11  Past Medical History: Past Medical History:  Diagnosis Date  . Anginal pain ([place])   . Arthritis   . Barrett esophagus   . Carotid arterial disease ([place])    a. 2011 s/p R CEA.  . Coronary artery disease    a. 2000 s/p PCI/BMS x 2 of OM2 [name], [place]); b. 12/2018  MV: EF 43% (likely 2/2 GI uptake artifact No ischemia. Low risk; c. 03/2019 PCI: LM nl, LAD 42m LCX nl, OM2 90 ISR, RCA 40p, 827m3.5x15 Resolute Onyx DES), RPL2 70. d. 06/04/2019 LHC 1. severe OM2 (suspected in stent restenosis), mild to mod dz LAD & RCA, patent mRCA stent, nl LV pressure, balloon angio to OM2 90p to 40p  . Depression   . Diabetes mellitus without complication ([place]  . Diastasis recti   . GERD (gastroesophageal reflux disease)   . History of hiatal hernia   . History of kidney stones   . History of tobacco abuse   . Hyperlipidemia LDL goal <70   . Hypertension   . Morbid obesity ([place]  . Osteoarthritis   . Sleep apnea     Tobacco Use: Social History   Tobacco Use  Smoking Status Former Smoker  . Types: Cigarettes, E-cigarettes  .  Quit date: 10/17/2018  . Years since quitting: 1.4  Smokeless Tobacco Never Used  Tobacco Comment   quit vaping 10/2018. quit smoking 2016    Labs: Recent Review Flowsheet Data    Labs for ITP Cardiac and Pulmonary Rehab Latest Ref Rng & Units 01/01/2019   Cholestrol 100 - 199 mg/dL 151   LDLCALC 0 - 99 mg/dL 76   HDL >39 mg/dL 44   Trlycerides 0 - 149 mg/dL 154(H)   Hemoglobin A1c 4.8 - 5.6 % 6.2(H)       Exercise Target Goals: Exercise Program Goal: Individual exercise prescription set using results from initial 6 min walk test and THRR while considering  patient's activity barriers and safety.   Exercise Prescription Goal: Initial exercise prescription builds to 30-45 minutes a day of aerobic activity, 2-3 days per week.  Home exercise guidelines will be given to patient during program as part of exercise prescription that the participant will acknowledge.   Education: Aerobic Exercise & Resistance Training: - Gives group verbal and written instruction on the various components of exercise. Focuses on aerobic and resistive training programs and the benefits of this training and how to safely progress through these programs..   Education: Exercise & Equipment Safety: - Individual verbal instruction and demonstration of equipment use and safety with use of the equipment.   [facility]  Date 03/19[facility]20  [facility] LLC[facility]ruction Review Code 1- Verbalizes Understanding      Education: Exercise Physiology & General Exercise Guidelines: - Group verbal and written instruction with models to review the exercise physiology of the cardiovascular system and associated critical values. Provides general exercise guidelines with specific guidelines to those with heart or lung disease.    Education: Flexibility, Balance, Mind/Body Relaxation: Provides group verbal/written instruction on the benefits of flexibility and balance training,  including mind/body exercise modes such as yoga, pilates and tai chi.  Demonstration and skill practice provided.   Activity Barriers & Risk Stratification:  Activity Barriers & Cardiac Risk Stratification - 03/19/20 0929      Activity Barriers & Cardiac Risk Stratification   Activity Barriers Joint Problems;Other (comment);Back Problems;Chest Pain/Angina;Shortness of Breath;Balance Concerns;Deconditioning;Muscular Weakness    Comments Right knee  needs replacement, back with spinal stenosis    Cardiac Risk Stratification High           6 Minute Walk:  6 Minute Walk    Row Name 03/19/20 0926         6 Minute Walk   Phase Initial  Distance 668 feet     Walk Time 2.75 minutes     # of Rest Breaks 0  stopped due to chest pain     MPH 2.76     METS 2.06     RPE 17     Perceived Dyspnea  3     VO2 Peak 7.19     Symptoms Yes (comment)     Comments chest pain coming in from car 2/10, chest pain with walk test 5/10 with hypertensive response, lightheaded, headache     Resting HR 62 bpm     Resting BP 144/82     Resting Oxygen Saturation  99 %     Exercise Oxygen Saturation  during 6 min walk 96 %     Max Ex. HR 104 bpm     Max Ex. BP 214/98     2 Minute Post BP 206/94  rck 156/80            Oxygen Initial Assessment:   Oxygen Re-Evaluation:   Oxygen Discharge (Final Oxygen Re-Evaluation):   Initial Exercise Prescription:  Initial Exercise Prescription - 03/19/20 1000      Date of Initial Exercise RX and Referring Provider   Date 03/19/20    Referring Provider Frazier Richards MD      Treadmill   MPH 2    Grade 0.5    Minutes 15    METs 2.67      NuStep   Level 1    SPM 80    Minutes 15    METs 2      REL-XR   Level 2    Speed 50    Minutes 15    METs 2      Biostep-RELP   Level 1    SPM 50    Minutes 15    METs 2      Prescription Details   Frequency (times per week) 2    Duration Progress to 30 minutes of continuous aerobic without  signs/symptoms of physical distress      Intensity   THRR 40-80% of Max Heartrate 99-136    Ratings of Perceived Exertion 11-13    Perceived Dyspnea 0-4      Progression   Progression Continue to progress workloads to maintain intensity without signs/symptoms of physical distress.      Resistance Training   Training Prescription Yes    Weight 3 lb    Reps 10-15           Perform Capillary Blood Glucose checks as needed.  Exercise Prescription Changes:  Exercise Prescription Changes    Row Name 03/19/20 1000             Response to Exercise   Blood Pressure (Admit) 144/82       Blood Pressure (Exercise) 214/98  202/94, 156/80       Blood Pressure (Exit) 136/80       Heart Rate (Admit) 62 bpm       Heart Rate (Exercise) 104 bpm       Heart Rate (Exit) 68 bpm       Oxygen Saturation (Admit) 99 %       Oxygen Saturation (Exercise) 98 %       Rating of Perceived Exertion (Exercise) 17       Perceived Dyspnea (Exercise) 3       Symptoms chest pain (2/10 on arrival) (5/10 walk test), lightheaded, headache       Comments walk  test results              Exercise Comments:  Exercise Comments    Row Name 03/24/20 0854           Exercise Comments First full day of exercise!  Patient was oriented to gym and equipment including functions, settings, policies, and procedures.  Patient's individual exercise prescription and treatment plan were reviewed.  All starting workloads were established based on the results of the 6 minute walk test done at initial orientation visit.  The plan for exercise progression was also introduced and progression will be customized based on patient's performance and goals.              Exercise Goals and Review:  Exercise Goals    Row Name 03/19/20 1036             Exercise Goals   Increase Physical Activity Yes       Intervention Provide advice, education, support and counseling about physical activity/exercise needs.;Develop an  individualized exercise prescription for aerobic and resistive training based on initial evaluation findings, risk stratification, comorbidities and participant's personal goals.       Expected Outcomes Short Term: Attend rehab on a regular basis to increase amount of physical activity.;Long Term: Add in home exercise to make exercise part of routine and to increase amount of physical activity.;Long Term: Exercising regularly at least 3-5 days a week.       Increase Strength and Stamina Yes       Intervention Provide advice, education, support and counseling about physical activity/exercise needs.;Develop an individualized exercise prescription for aerobic and resistive training based on initial evaluation findings, risk stratification, comorbidities and participant's personal goals.       Expected Outcomes Short Term: Increase workloads from initial exercise prescription for resistance, speed, and METs.;Short Term: Perform resistance training exercises routinely during rehab and add in resistance training at home;Long Term: Improve cardiorespiratory fitness, muscular endurance and strength as measured by increased METs and functional capacity (6MWT)       Able to understand and use rate of perceived exertion (RPE) scale Yes       Intervention Provide education and explanation on how to use RPE scale       Expected Outcomes Short Term: Able to use RPE daily in rehab to express subjective intensity level;Long Term:  Able to use RPE to guide intensity level when exercising independently       Able to understand and use Dyspnea scale Yes       Intervention Provide education and explanation on how to use Dyspnea scale       Expected Outcomes Short Term: Able to use Dyspnea scale daily in rehab to express subjective sense of shortness of breath during exertion;Long Term: Able to use Dyspnea scale to guide intensity level when exercising independently       Knowledge and understanding of Target Heart Rate Range  (THRR) Yes       Intervention Provide education and explanation of THRR including how the numbers were predicted and where they are located for reference       Expected Outcomes Short Term: Able to use daily as guideline for intensity in rehab;Long Term: Able to use THRR to govern intensity when exercising independently;Short Term: Able to state/look up THRR       Able to check pulse independently Yes       Intervention Provide education and demonstration on how to check pulse in carotid and radial  arteries.;Review the importance of being able to check your own pulse for safety during independent exercise       Expected Outcomes Short Term: Able to explain why pulse checking is important during independent exercise;Long Term: Able to check pulse independently and accurately       Understanding of Exercise Prescription Yes       Intervention Provide education, explanation, and written materials on patient's individual exercise prescription       Expected Outcomes Short Term: Able to explain program exercise prescription;Long Term: Able to explain home exercise prescription to exercise independently              Exercise Goals Re-Evaluation :  Exercise Goals Re-Evaluation    Row Name 03/24/20 0854             Exercise Goal Re-Evaluation   Exercise Goals Review Able to understand and use rate of perceived exertion (RPE) scale;Knowledge and understanding of Target Heart Rate Range (THRR);Understanding of Exercise Prescription       Comments Reviewed RPE and dyspnea scales, THR and program prescription with pt today.  Pt voiced understanding and was given a copy of goals to take home.       Expected Outcomes Short: Use RPE daily to regulate intensity. Long: Follow program prescription in THR.              Discharge Exercise Prescription (Final Exercise Prescription Changes):  Exercise Prescription Changes - 03/19/20 1000      Response to Exercise   Blood Pressure (Admit) 144/82     Blood Pressure (Exercise) 214/98   202/94, 156/80   Blood Pressure (Exit) 136/80    Heart Rate (Admit) 62 bpm    Heart Rate (Exercise) 104 bpm    Heart Rate (Exit) 68 bpm    Oxygen Saturation (Admit) 99 %    Oxygen Saturation (Exercise) 98 %    Rating of Perceived Exertion (Exercise) 17    Perceived Dyspnea (Exercise) 3    Symptoms chest pain (2/10 on arrival) (5/10 walk test), lightheaded, headache    Comments walk test results           Nutrition:  Target Goals: Understanding of nutrition guidelines, daily intake of sodium '1500mg'$ , cholesterol '200mg'$ , calories 30% from fat and 7% or less from saturated fats, daily to have 5 or more servings of fruits and vegetables.  Education: Controlling Sodium/Reading Food Labels -Group verbal and written material supporting the discussion of sodium use in heart healthy nutrition. Review and explanation with models, verbal and written materials for utilization of the food label.   Education: General Nutrition Guidelines/Fats and Fiber: -Group instruction provided by verbal, written material, models and posters to present the general guidelines for heart healthy nutrition. Gives an explanation and review of dietary fats and fiber.   Biometrics:  Pre Biometrics - 03/19/20 1038      Pre Biometrics   Height 5' 7.75" (1.721 m)    Weight 257 lb 1.6 oz (116.6 kg)    BMI (Calculated) 39.37    Single Leg Stand 30 seconds            Nutrition Therapy Plan and Nutrition Goals:  Nutrition Therapy & Goals - 03/31/20 0821      Nutrition Therapy   Diet Low Na, heart healthy, diabetes    Drug/Food Interactions Statins/Certain Fruits    Protein (specify units) 95g    Fiber 30 grams    Whole Grain Foods 3 servings    Saturated Fats  12 max. grams    Fruits and Vegetables 5 servings/day    Sodium 1.5 grams      Personal Nutrition Goals   Nutrition Goal ST: Avoid "naked" CHOs, choose more complex CHOs LT: control BG (120 usually in AM)   Sleep  apnea better now wearing CPAP   Comments D: baked cod with peas and carrots S: 2 small nectarines, 7 prunes (8-8:30). Wife has been very supportive with health. Discussed heart healthy and diabetes friendly.      Intervention Plan   Intervention Prescribe, educate and counsel regarding individualized specific dietary modifications aiming towards targeted core components such as weight, hypertension, lipid management, diabetes, heart failure and other comorbidities.;Nutrition handout(s) given to patient.    Expected Outcomes Short Term Goal: Understand basic principles of dietary content, such as calories, fat, sodium, cholesterol and nutrients.;Short Term Goal: A plan has been developed with personal nutrition goals set during dietitian appointment.;Long Term Goal: Adherence to prescribed nutrition plan.           Nutrition Assessments:  Nutrition Assessments - 03/19/20 1040      MEDFICTS Scores   Pre Score 23           MEDIFICTS Score Key:          ?70 Need to make dietary changes          40-70 Heart Healthy Diet         ? 40 Therapeutic Level Cholesterol Diet  Nutrition Goals Re-Evaluation:   Nutrition Goals Discharge (Final Nutrition Goals Re-Evaluation):   Psychosocial: Target Goals: Acknowledge presence or absence of significant depression and/or stress, maximize coping skills, provide positive support system. Participant is able to verbalize types and ability to use techniques and skills needed for reducing stress and depression.   Education: Depression - Provides group verbal and written instruction on the correlation between heart/lung disease and depressed mood, treatment options, and the stigmas associated with seeking treatment.   Education: Sleep Hygiene -Provides group verbal and written instruction about how sleep can affect your health.  Define sleep hygiene, discuss sleep cycles and impact of sleep habits. Review good sleep hygiene tips.     Education:  Stress and Anxiety: - Provides group verbal and written instruction about the health risks of elevated stress and causes of high stress.  Discuss the correlation between heart/lung disease and anxiety and treatment options. Review healthy ways to manage with stress and anxiety.    Initial Review & Psychosocial Screening:  Initial Psych Review & Screening - 03/18/20 1116      Initial Review   Current issues with Current Stress Concerns;Current Psychotropic Meds    Source of Stress Concerns Chronic Illness    Comments Stress of the illness and has increased Lexapro recently.      Family Dynamics   Good Support System? Yes   Wife, family     Barriers   Psychosocial barriers to participate in program There are no identifiable barriers or psychosocial needs.;The patient should benefit from training in stress management and relaxation.      Screening Interventions   Interventions Encouraged to exercise;Provide feedback about the scores to participant;To provide support and resources with identified psychosocial needs    Expected Outcomes Short Term goal: Utilizing psychosocial counselor, staff and physician to assist with identification of specific Stressors or current issues interfering with healing process. Setting desired goal for each stressor or current issue identified.;Long Term Goal: Stressors or current issues are controlled or eliminated.;Short Term goal: Identification  and review with participant of any Quality of Life or Depression concerns found by scoring the questionnaire.;Long Term goal: The participant improves quality of Life and PHQ9 Scores as seen by post scores and/or verbalization of changes           Quality of Life Scores:   Quality of Life - 03/19/20 1039      Quality of Life   Select Quality of Life      Quality of Life Scores   Health/Function Pre 20.9 %    Socioeconomic Pre 22.87 %    Psych/Spiritual Pre 21.14 %    Family Pre 29.5 %    GLOBAL Pre 22.46 %            Scores of 19 and below usually indicate a poorer quality of life in these areas.  A difference of  2-3 points is a clinically meaningful difference.  A difference of 2-3 points in the total score of the Quality of Life Index has been associated with significant improvement in overall quality of life, self-image, physical symptoms, and general health in studies assessing change in quality of life.  PHQ-9: Recent Review Flowsheet Data    Depression screen [facility] 2/9 03/19/2020 05/14/2019   Decreased Interest 1 1   Down, Depressed, Hopeless 1 0   PHQ - 2 Score 2 1   Altered sleeping 0 0   Tired, decreased energy 1 2   Change in appetite 1 0   Feeling bad or failure about yourself  0 0   Trouble concentrating 0 0   Moving slowly or fidgety/restless 0 0   Suicidal thoughts 0 0   PHQ-9 Score 4 3   Difficult doing work/chores Not difficult at all Not difficult at all     Interpretation of Total Score  Total Score Depression Severity:  1-4 = Minimal depression, 5-9 = Mild depression, 10-14 = Moderate depression, 15-19 = Moderately severe depression, 20-27 = Severe depression   Psychosocial Evaluation and Intervention:  Psychosocial Evaluation - 03/18/20 1132      Psychosocial Evaluation & Interventions   Interventions Encouraged to exercise with the program and follow exercise prescription;Relaxation education;Stress management education    Comments Roberthas no barriers to entering the program.  He has stress concerns with his chronic symptoms and BP elevations. He recently had his Lexapro dose increased from 10 to 20 mg and stated that it is helping with the anxiety of his uncontrolled BP. He also stated that his cardiologist recently told him  that they will continue to search for a reason why his BP goes so high without warning. He was encouraged to hear that his doctor has not stopped trying to figure out what is causing his health issues.  Isaah lives in his home with his wife. He  has a great support system with his wife and family and church family. He has not retired yet, and is presently  on short term disabilty to soon return to work. Jahree should do well with the program .    Expected Outcomes STG: Psalm will be able to attend on consistent basis and manage to work on equipment that will not aggravate his knee or back problems.    LTG: Townsend will be able to continue the lifestyle changes he learned and started in class.    Continue Psychosocial Services  Follow up required by staff           Psychosocial Re-Evaluation:   Psychosocial Discharge (Final Psychosocial Re-Evaluation):  Vocational Rehabilitation: Provide vocational rehab assistance to qualifying candidates.   Vocational Rehab Evaluation & Intervention:  Vocational Rehab - 03/18/20 1119      Initial Vocational Rehab Evaluation & Intervention   Assessment shows need for Vocational Rehabilitation No           Education: Education Goals: Education classes will be provided on a variety of topics geared toward better understanding of heart health and risk factor modification. Participant will state understanding/return demonstration of topics presented as noted by education test scores.  Learning Barriers/Preferences:  Learning Barriers/Preferences - 03/18/20 1119      Learning Barriers/Preferences   Learning Barriers None    Learning Preferences None           General Cardiac Education Topics:  AED/CPR: - Group verbal and written instruction with the use of models to demonstrate the basic use of the AED with the basic ABC's of resuscitation.   Anatomy & Physiology of the Heart: - Group verbal and written instruction and models provide basic cardiac anatomy and physiology, with the coronary electrical and arterial systems. Review of Valvular disease and Heart Failure   Cardiac Procedures: - Group verbal and written instruction to review commonly prescribed medications for heart  disease. Reviews the medication, class of the drug, and side effects. Includes the steps to properly store meds and maintain the prescription regimen. (beta blockers and nitrates)   Cardiac Medications I: - Group verbal and written instruction to review commonly prescribed medications for heart disease. Reviews the medication, class of the drug, and side effects. Includes the steps to properly store meds and maintain the prescription regimen.   Cardiac Medications II: -Group verbal and written instruction to review commonly prescribed medications for heart disease. Reviews the medication, class of the drug, and side effects. (all other drug classes)    Go Sex-Intimacy & Heart Disease, Get SMART - Goal Setting: - Group verbal and written instruction through game format to discuss heart disease and the return to sexual intimacy. Provides group verbal and written material to discuss and apply goal setting through the application of the [employer] Method.   Other Matters of the Heart: - Provides group verbal, written materials and models to describe Stable Angina and Peripheral Artery. Includes description of the disease process and treatment options available to the cardiac patient.   Infection Prevention: - Provides verbal and written material to individual with discussion of infection control including proper hand washing and proper equipment cleaning during exercise session.   Cardiac Rehab from 03/19/2020 in [facility] Cardiac and Pulmonary Rehab  Date 03/19/20  Educator [facility]  Instruction Review Code 1- Verbalizes Understanding      Falls Prevention: - Provides verbal and written material to individual with discussion of falls prevention and safety.   Cardiac Rehab from 03/19/2020 in [facility] Cardiac and Pulmonary Rehab  Date 03/19/20  Educator [facility]  Instruction Review Code 1- Verbalizes Understanding      Other: -Provides group and verbal instruction on various topics (see  comments)   Knowledge Questionnaire Score:  Knowledge Questionnaire Score - 03/19/20 1040      Knowledge Questionnaire Score   Pre Score 23/26 Education Focus: Nutrition and Angina           Core Components/Risk Factors/Patient Goals at Admission:  Personal Goals and Risk Factors at Admission - 03/19/20 1040      Core Components/Risk Factors/Patient Goals on Admission    Weight Management Yes;Weight Loss;Obesity    Intervention Weight Management: Develop a combined  nutrition and exercise program designed to reach desired caloric intake, while maintaining appropriate intake of nutrient and fiber, sodium and fats, and appropriate energy expenditure required for the weight goal.;Weight Management: Provide education and appropriate resources to help participant work on and attain dietary goals.;Obesity: Provide education and appropriate resources to help participant work on and attain dietary goals.;Weight Management/Obesity: Establish reasonable short term and long term weight goals.    Admit Weight 257 lb 1.6 oz (116.6 kg)    Goal Weight: Short Term 252 lb (114.3 kg)    Goal Weight: Long Term 185 lb (83.9 kg)    Expected Outcomes Short Term: Continue to assess and modify interventions until short term weight is achieved;Long Term: Adherence to nutrition and physical activity/exercise program aimed toward attainment of established weight goal;Weight Loss: Understanding of general recommendations for a balanced deficit meal plan, which promotes 1-2 lb weight loss per week and includes a negative energy balance of [phone] kcal/d;Understanding recommendations for meals to include 15-35% energy as protein, 25-35% energy from fat, 35-60% energy from carbohydrates, less than '200mg'$  of dietary cholesterol, 20-35 gm of total fiber daily;Understanding of distribution of calorie intake throughout the day with the consumption of 4-5 meals/snacks    Diabetes Yes    Intervention Provide education about  signs/symptoms and action to take for hypo/hyperglycemia.;Provide education about proper nutrition, including hydration, and aerobic/resistive exercise prescription along with prescribed medications to achieve blood glucose in normal ranges: Fasting glucose 65-99 mg/dL    Expected Outcomes Short Term: Participant verbalizes understanding of the signs/symptoms and immediate care of hyper/hypoglycemia, proper foot care and importance of medication, aerobic/resistive exercise and nutrition plan for blood glucose control.;Long Term: Attainment of HbA1C < 7%.    Hypertension Yes    Intervention Provide education on lifestyle modifcations including regular physical activity/exercise, weight management, moderate sodium restriction and increased consumption of fresh fruit, vegetables, and low fat dairy, alcohol moderation, and smoking cessation.;Monitor prescription use compliance.    Expected Outcomes Short Term: Continued assessment and intervention until BP is < 140/81m HG in hypertensive participants. < 130/838mHG in hypertensive participants with diabetes, heart failure or chronic kidney disease.;Long Term: Maintenance of blood pressure at goal levels.    Lipids Yes    Intervention Provide education and support for participant on nutrition & aerobic/resistive exercise along with prescribed medications to achieve LDL '70mg'$ , HDL >'40mg'$ .    Expected Outcomes Short Term: Participant states understanding of desired cholesterol values and is compliant with medications prescribed. Participant is following exercise prescription and nutrition guidelines.;Long Term: Cholesterol controlled with medications as prescribed, with individualized exercise RX and with personalized nutrition plan. Value goals: LDL < '70mg'$ , HDL > 40 mg.           Education:Diabetes - Individual verbal and written instruction to review signs/symptoms of diabetes, desired ranges of glucose level fasting, after meals and with exercise.  Acknowledge that pre and post exercise glucose checks will be done for 3 sessions at entry of program.   [facility]  Date 03/19/20  [facility] Code 1- Verbalizes Understanding      Education: Know Your Numbers and Risk Factors: -Group verbal and written instruction about important numbers in your health.  Discussion of what are risk factors and how they play a role in the disease process.  Review of Cholesterol, Blood Pressure, Diabetes, and BMI and the role they play in your overall health.   Core Components/Risk Factors/Patient Goals  Review:    Core Components/Risk Factors/Patient Goals at Discharge (Final Review):    ITP Comments:  ITP Comments    Row Name 03/18/20 1129 03/19/20 0926 03/24/20 0854 04/01/20 0652     ITP Comments Virtual Orientation completed today. Has an appt for EP Eval and gym orientation tomorrow at 8 AM. Documentation of Diagnosis [facility] be found in [facility] 02/17/2020 Completed 6MWT and gym orientation.  Initial ITP created and sent for review to Dr. Emily Filbert, Medical Director. First full day of exercise!  Patient was oriented to gym and equipment including functions, settings, policies, and procedures.  Patient's individual exercise prescription and treatment plan were reviewed.  All starting workloads were established based on the results of the 6 minute walk test done at initial orientation visit.  The plan for exercise progression was also introduced and progression will be customized based on patient's performance and goals. 30 Day review completed. Medical Director ITP review done, changes made as directed, and signed approval by Medical Director.           Comments: 30 Day review completed. Medical Director ITP review done, changes made as directed, and signed approval by Medical Director.

## 2020-04-02 ENCOUNTER — Other Ambulatory Visit: Payer: Self-pay

## 2020-04-02 ENCOUNTER — Encounter: Payer: BC Managed Care – PPO | Admitting: *Deleted

## 2020-04-02 DIAGNOSIS — Z955 Presence of coronary angioplasty implant and graft: Secondary | ICD-10-CM

## 2020-04-02 NOTE — Progress Notes (Signed)
Daily Session Note  Patient Details  Name: Brandon Henderson MRN: [id number] Date of Birth: November 10, 1955 Referring Provider:     Cardiac Rehab from 03/19/2020 in [facility] Cardiac and Pulmonary Rehab  Referring Provider Frazier Richards MD      Encounter Date: 04/02/2020  Check In:  Session Check In - 04/02/20 0933      Check-In   Supervising physician immediately available to respond to emergencies See telemetry face sheet for immediately available ER MD    Location [facility]-Cardiac & Pulmonary Rehab    Staff Present Heath Lark, RN, BSN, CCRP;Melissa Piedmont RDN, Rowe Pavy, BA, ACSM CEP, Exercise Physiologist    Virtual Visit No    Medication changes reported     No    Fall or balance concerns reported    No    Warm-up and Cool-down Performed on first and last piece of equipment    Resistance Training Performed Yes    VAD Patient? No    PAD/SET Patient? No      Pain Assessment   Currently in Pain? No/denies              Social History   Tobacco Use  Smoking Status Former Smoker  . Types: Cigarettes, E-cigarettes  . Quit date: 10/17/2018  . Years since quitting: 1.4  Smokeless Tobacco Never Used  Tobacco Comment   quit vaping 10/2018. quit smoking 2016    Goals Met:  Independence with exercise equipment Exercise tolerated well No report of cardiac concerns or symptoms  Goals Unmet:  Not Applicable  Comments: Pt able to follow exercise prescription today without complaint.  Will continue to monitor for progression.    Dr. Emily Filbert is Medical Director for [place] and [name].

## 2020-04-09 ENCOUNTER — Encounter: Payer: BC Managed Care – PPO | Admitting: *Deleted

## 2020-04-09 ENCOUNTER — Other Ambulatory Visit: Payer: Self-pay

## 2020-04-09 DIAGNOSIS — Z955 Presence of coronary angioplasty implant and graft: Secondary | ICD-10-CM | POA: Diagnosis not present

## 2020-04-09 NOTE — Progress Notes (Signed)
Daily Session Note  Patient Details  Name: Brandon Henderson MRN: [id number] Date of Birth: 1956/03/08 Referring Provider:     Cardiac Rehab from 03/19/2020 in [facility] Cardiac and Pulmonary Rehab  Referring Provider Frazier Richards MD      Encounter Date: 04/09/2020  Check In:  Session Check In - 04/09/20 0821      Check-In   Supervising physician immediately available to respond to emergencies See telemetry face sheet for immediately available ER MD    Location [facility]-Cardiac & Pulmonary Rehab    Staff Present Heath Lark, RN, BSN, CCRP;Amanda Sommer, BA, ACSM CEP, Exercise Physiologist;Jessica Strawn, MA, RCEP, CCRP, Marylynn Pearson, MS Exercise Physiologist    Virtual Visit No    Medication changes reported     No    Fall or balance concerns reported    No    Warm-up and Cool-down Performed on first and last piece of equipment    Resistance Training Performed Yes    VAD Patient? No    PAD/SET Patient? No      Pain Assessment   Currently in Pain? No/denies              Social History   Tobacco Use  Smoking Status Former Smoker  . Types: Cigarettes, E-cigarettes  . Quit date: 10/17/2018  . Years since quitting: 1.4  Smokeless Tobacco Never Used  Tobacco Comment   quit vaping 10/2018. quit smoking 2016    Goals Met:  Independence with exercise equipment Exercise tolerated well No report of cardiac concerns or symptoms  Goals Unmet:  Not Applicable  Comments: Pt able to follow exercise prescription today without complaint.  Will continue to monitor for progression.  Mikki Santee is wearing a heart monitor. He does not know for how long.     Dr. Emily Filbert is Medical Director for [place] and [name].

## 2020-04-14 ENCOUNTER — Encounter: Payer: BC Managed Care – PPO | Admitting: *Deleted

## 2020-04-14 ENCOUNTER — Other Ambulatory Visit: Payer: Self-pay

## 2020-04-14 DIAGNOSIS — Z955 Presence of coronary angioplasty implant and graft: Secondary | ICD-10-CM | POA: Diagnosis not present

## 2020-04-14 NOTE — Progress Notes (Signed)
Daily Session Note  Patient Details  Name: Brandon Henderson MRN: [id number] Date of Birth: [birth year] Referring Provider:     Cardiac Rehab from 03/19/2020 in [facility] Cardiac and Pulmonary Rehab  Referring Provider Frazier Richards MD      Encounter Date: 04/14/2020  Check In:  Session Check In - 04/14/20 0931      Check-In   Supervising physician immediately available to respond to emergencies See telemetry face sheet for immediately available ER MD    Location [facility]-Cardiac & Pulmonary Rehab    Staff Present Heath Lark, RN, BSN, CCRP;Joseph Foy [place], [place], ACSM CEP, Exercise Physiologist    Virtual Visit No    Medication changes reported     No    Fall or balance concerns reported    No    Warm-up and Cool-down Performed on first and last piece of equipment    Resistance Training Performed Yes    VAD Patient? No    PAD/SET Patient? No      Pain Assessment   Currently in Pain? No/denies              Social History   Tobacco Use  Smoking Status Former Smoker  . Types: Cigarettes, E-cigarettes  . Quit date: 10/17/2018  . Years since quitting: 1.4  Smokeless Tobacco Never Used  Tobacco Comment   quit vaping 10/2018. quit smoking 2016    Goals Met:  Independence with exercise equipment Exercise tolerated well Personal goals reviewed No report of cardiac concerns or symptoms  Goals Unmet:  Not Applicable  Comments: Pt able to follow exercise prescription today without complaint.  Will continue to monitor for progression.    Dr. Emily Filbert is Medical Director for [place] and [name].

## 2020-04-16 ENCOUNTER — Encounter: Payer: BC Managed Care – PPO | Attending: Internal Medicine | Admitting: *Deleted

## 2020-04-16 ENCOUNTER — Other Ambulatory Visit: Payer: Self-pay

## 2020-04-16 DIAGNOSIS — Z79899 Other long term (current) drug therapy: Secondary | ICD-10-CM | POA: Diagnosis not present

## 2020-04-16 DIAGNOSIS — K219 Gastro-esophageal reflux disease without esophagitis: Secondary | ICD-10-CM | POA: Diagnosis not present

## 2020-04-16 DIAGNOSIS — Z7982 Long term (current) use of aspirin: Secondary | ICD-10-CM | POA: Diagnosis not present

## 2020-04-16 DIAGNOSIS — I2510 Atherosclerotic heart disease of native coronary artery without angina pectoris: Secondary | ICD-10-CM | POA: Insufficient documentation

## 2020-04-16 DIAGNOSIS — F329 Major depressive disorder, single episode, unspecified: Secondary | ICD-10-CM | POA: Insufficient documentation

## 2020-04-16 DIAGNOSIS — Z7902 Long term (current) use of antithrombotics/antiplatelets: Secondary | ICD-10-CM | POA: Insufficient documentation

## 2020-04-16 DIAGNOSIS — E785 Hyperlipidemia, unspecified: Secondary | ICD-10-CM | POA: Diagnosis not present

## 2020-04-16 DIAGNOSIS — I10 Essential (primary) hypertension: Secondary | ICD-10-CM | POA: Insufficient documentation

## 2020-04-16 DIAGNOSIS — G4730 Sleep apnea, unspecified: Secondary | ICD-10-CM | POA: Diagnosis not present

## 2020-04-16 DIAGNOSIS — Z955 Presence of coronary angioplasty implant and graft: Secondary | ICD-10-CM | POA: Diagnosis not present

## 2020-04-16 DIAGNOSIS — Z6839 Body mass index (BMI) 39.0-39.9, adult: Secondary | ICD-10-CM | POA: Diagnosis not present

## 2020-04-16 DIAGNOSIS — Z87442 Personal history of urinary calculi: Secondary | ICD-10-CM | POA: Insufficient documentation

## 2020-04-16 DIAGNOSIS — E119 Type 2 diabetes mellitus without complications: Secondary | ICD-10-CM | POA: Insufficient documentation

## 2020-04-16 DIAGNOSIS — Z87891 Personal history of nicotine dependence: Secondary | ICD-10-CM | POA: Insufficient documentation

## 2020-04-16 DIAGNOSIS — M1990 Unspecified osteoarthritis, unspecified site: Secondary | ICD-10-CM | POA: Diagnosis not present

## 2020-04-16 NOTE — Progress Notes (Signed)
Daily Session Note  Patient Details  Name: Brandon Henderson MRN: [id number] Date of Birth: 04-Aug-1956 Referring Provider:     Cardiac Rehab from 03/19/2020 in [facility] Cardiac and Pulmonary Rehab  Referring Provider Frazier Richards MD      Encounter Date: 04/16/2020  Check In:  Session Check In - 04/16/20 0838      Check-In   Supervising physician immediately available to respond to emergencies See telemetry face sheet for immediately available ER MD    Location [facility]-Cardiac & Pulmonary Rehab    Staff Present Nyoka Cowden, RN, BSN, Willette Pa, MA, RCEP, CCRP, [place], [place], ACSM CEP, Exercise Physiologist    Virtual Visit No    Medication changes reported     No    Fall or balance concerns reported    No    Warm-up and Cool-down Performed on first and last piece of equipment    Resistance Training Performed Yes    VAD Patient? No    PAD/SET Patient? No      Pain Assessment   Currently in Pain? No/denies              Social History   Tobacco Use  Smoking Status Former Smoker  . Types: Cigarettes, E-cigarettes  . Quit date: 10/17/2018  . Years since quitting: 1.4  Smokeless Tobacco Never Used  Tobacco Comment   quit vaping 10/2018. quit smoking 2016    Goals Met:  Independence with exercise equipment Exercise tolerated well Strength training completed today  Goals Unmet:  Chest pain  Comments: Pt able to follow exercise prescription today without complaint.  Will continue to monitor for progression.  Reviewed home exercise with pt today.  Pt plans to walk at home for exercise.  Reviewed THR, pulse, RPE, sign and symptoms, pulse oximetery and when to call 911 or MD.  Also discussed weather considerations and indoor options.  Pt voiced understanding.    Dr. Emily Filbert is Medical Director for [place] and [name].

## 2020-04-21 ENCOUNTER — Other Ambulatory Visit: Payer: Self-pay

## 2020-04-21 ENCOUNTER — Encounter: Payer: BC Managed Care – PPO | Admitting: *Deleted

## 2020-04-21 DIAGNOSIS — Z955 Presence of coronary angioplasty implant and graft: Secondary | ICD-10-CM

## 2020-04-21 NOTE — Progress Notes (Signed)
Daily Session Note  Patient Details  Name: Brandon Henderson MRN: [id number] Date of Birth: 01/10/1956 Referring Provider:     Cardiac Rehab from 03/19/2020 in [facility] Cardiac and Pulmonary Rehab  Referring Provider Frazier Richards MD      Encounter Date: 04/21/2020  Check In:  Session Check In - 04/21/20 0810      Check-In   Supervising physician immediately available to respond to emergencies See telemetry face sheet for immediately available ER MD    Location [facility]-Cardiac & Pulmonary Rehab    Staff Present Heath Lark, RN, BSN, Jacklynn Bue, MS Exercise Physiologist;Amanda Oletta [place], [place], ACSM CEP, Exercise Physiologist    Virtual Visit No    Medication changes reported     No    Fall or balance concerns reported    No    Warm-up and Cool-down Performed on first and last piece of equipment    Resistance Training Performed Yes    VAD Patient? No    PAD/SET Patient? No      Pain Assessment   Currently in Pain? No/denies              Social History   Tobacco Use  Smoking Status Former Smoker  . Types: Cigarettes, E-cigarettes  . Quit date: 10/17/2018  . Years since quitting: 1.5  Smokeless Tobacco Never Used  Tobacco Comment   quit vaping 10/2018. quit smoking 2016    Goals Met:  Independence with exercise equipment Exercise tolerated well  Goals Unmet:  [name] continues to have angina symptoms with exercise. He slows workload to resolve his symptoms  Comments: Pt able to follow exercise prescription today without complaint other than his usual anginal symptoms. Will continue to monitor for progression.    Dr. Emily Filbert is Medical Director for [place] and [name].

## 2020-04-23 ENCOUNTER — Encounter: Payer: BC Managed Care – PPO | Admitting: *Deleted

## 2020-04-23 ENCOUNTER — Other Ambulatory Visit: Payer: Self-pay

## 2020-04-23 DIAGNOSIS — Z955 Presence of coronary angioplasty implant and graft: Secondary | ICD-10-CM | POA: Diagnosis not present

## 2020-04-23 NOTE — Progress Notes (Signed)
Daily Session Note  Patient Details  Name: Brandon Henderson MRN: [id number] Date of Birth: 1956/08/25 Referring Provider:     Cardiac Rehab from 03/19/2020 in [facility] Cardiac and Pulmonary Rehab  Referring Provider Frazier Richards MD      Encounter Date: 04/23/2020  Check In:  Session Check In - 04/23/20 0805      Check-In   Supervising physician immediately available to respond to emergencies See telemetry face sheet for immediately available ER MD    Location [facility]-Cardiac & Pulmonary Rehab    Staff Present Heath Lark, RN, BSN, CCRP;Joseph Foy [place], [place], ACSM CEP, Exercise Physiologist    Virtual Visit No    Medication changes reported     No    Fall or balance concerns reported    No    Warm-up and Cool-down Performed on first and last piece of equipment    Resistance Training Performed Yes    VAD Patient? No    PAD/SET Patient? No      Pain Assessment   Currently in Pain? No/denies              Social History   Tobacco Use  Smoking Status Former Smoker  . Types: Cigarettes, E-cigarettes  . Quit date: 10/17/2018  . Years since quitting: 1.5  Smokeless Tobacco Never Used  Tobacco Comment   quit vaping 10/2018. quit smoking 2016    Goals Met:  Independence with exercise equipment Exercise tolerated well No report of cardiac concerns or symptoms  Goals Unmet:  Not Applicable  Comments: Pt able to follow exercise prescription today without complaint.  Will continue to monitor for progression.    Dr. Emily Filbert is Medical Director for [place] and [name].

## 2020-04-28 ENCOUNTER — Encounter: Payer: BC Managed Care – PPO | Admitting: *Deleted

## 2020-04-28 ENCOUNTER — Other Ambulatory Visit: Payer: Self-pay

## 2020-04-28 DIAGNOSIS — Z955 Presence of coronary angioplasty implant and graft: Secondary | ICD-10-CM | POA: Diagnosis not present

## 2020-04-28 NOTE — Progress Notes (Signed)
Daily Session Note  Patient Details  Name: Brandon Henderson MRN: [id number] Date of Birth: February 07, 1956 Referring Provider:     Cardiac Rehab from 03/19/2020 in [facility] Cardiac and Pulmonary Rehab  Referring Provider Frazier Richards MD      Encounter Date: 04/28/2020  Check In:  Session Check In - 04/28/20 0834      Check-In   Supervising physician immediately available to respond to emergencies See telemetry face sheet for immediately available ER MD    Location [facility]-Cardiac & Pulmonary Rehab    Staff Present Heath Lark, RN, BSN, Jacklynn Bue, MS Exercise Physiologist;Amanda Oletta [place], [place], ACSM CEP, Exercise Physiologist    Virtual Visit No    Medication changes reported     No    Fall or balance concerns reported    No    Warm-up and Cool-down Performed on first and last piece of equipment    Resistance Training Performed Yes    VAD Patient? No    PAD/SET Patient? No      Pain Assessment   Currently in Pain? No/denies              Social History   Tobacco Use  Smoking Status Former Smoker  . Types: Cigarettes, E-cigarettes  . Quit date: 10/17/2018  . Years since quitting: 1.5  Smokeless Tobacco Never Used  Tobacco Comment   quit vaping 10/2018. quit smoking 2016    Goals Met:  Independence with exercise equipment Exercise tolerated well No report of cardiac concerns or symptoms  Goals Unmet:  Not Applicable  Comments: Pt able to follow exercise prescription today without complaint.  Will continue to monitor for progression.    Dr. Emily Filbert is Medical Director for [place] and [name].

## 2020-04-29 ENCOUNTER — Encounter: Payer: Self-pay | Admitting: *Deleted

## 2020-04-29 DIAGNOSIS — Z955 Presence of coronary angioplasty implant and graft: Secondary | ICD-10-CM

## 2020-04-29 NOTE — Progress Notes (Signed)
Cardiac Individual Treatment Plan  Patient Details  Name: Brandon Henderson MRN: [id number] Date of Birth: 24-Jan-1956 Referring Provider:     Cardiac Rehab from 03/19/2020 in [facility] Cardiac and Pulmonary Rehab  Referring Provider Frazier Richards MD      Initial Encounter Date:    Cardiac Rehab from 03/19/2020 in [facility] Cardiac and Pulmonary Rehab  Date 03/19/20      Visit Diagnosis: Status post coronary artery stent placement  Patient's Home Medications on Admission:  Current Outpatient Medications:  .  Ascorbic Acid (VITAMIN C) 1000 MG tablet, Take 1,000 mg by mouth at bedtime. , Disp: , Rfl:  .  aspirin EC 81 MG tablet, Take 81 mg by mouth at bedtime. , Disp: , Rfl:  .  atorvastatin (LIPITOR) 40 MG tablet, Take 1 tablet (40 mg total) by mouth daily. Please call to schedule an appointment for further refills., Disp: 90 tablet, Rfl: 0 .  clopidogrel (PLAVIX) 75 MG tablet, Take 1 tablet (75 mg total) by mouth daily with breakfast. (Patient taking differently: Take 75 mg by mouth at bedtime. ), Disp: 90 tablet, Rfl: 3 .  Coenzyme Q10 (COQ10) 200 MG CAPS, Take 200 mg by mouth at bedtime., Disp: , Rfl:  .  diphenhydrAMINE (BENADRYL) 50 MG tablet, Take 1 tablet (50 mg) by mouth prior to leaving home to your procedure with a small sip of water., Disp: 1 tablet, Rfl: 0 .  escitalopram (LEXAPRO) 10 MG tablet, Take 10 mg by mouth at bedtime. , Disp: , Rfl:  .  ezetimibe (ZETIA) 10 MG tablet, Take 10 mg by mouth at bedtime. , Disp: , Rfl:  .  furosemide (LASIX) 40 MG tablet, Take 1 tablet (40 mg total) by mouth daily., Disp: 30 tablet, Rfl: 3 .  Glucosamine-Chondroitin (COSAMIN DS PO), Take 2 tablets by mouth at bedtime. , Disp: , Rfl:  .  isosorbide mononitrate (IMDUR) 30 MG 24 hr tablet, Take 1 tablet (30 mg total) by mouth daily. (Patient taking differently: Take 30 mg by mouth at bedtime. ), Disp: 90 tablet, Rfl: 3 .  magnesium oxide (MAG-OX) 400 MG tablet, Take 400 mg by mouth at bedtime.,  Disp: , Rfl:  .  metoprolol tartrate (LOPRESSOR) 25 MG tablet, Take 0.5 tablets (12.5 mg total) by mouth 2 (two) times daily., Disp: 90 tablet, Rfl: 3 .  Multiple Vitamin (MULTIVITAMIN WITH MINERALS) TABS tablet, Take 1 tablet by mouth at bedtime., Disp: , Rfl:  .  nitroGLYCERIN (NITROSTAT) 0.4 MG SL tablet, Place 1 tablet (0.4 mg total) under the tongue every 5 (five) minutes as needed for chest pain., Disp: 90 tablet, Rfl: 3 .  pantoprazole (PROTONIX) 40 MG tablet, Take 40 mg by mouth at bedtime. , Disp: , Rfl:  .  Psyllium (METAMUCIL PO), Take 1 Dose by mouth at bedtime., Disp: , Rfl:  .  ramipril (ALTACE) 5 MG capsule, Take 1 capsule (5 mg total) by mouth daily. Please call to schedule appointment for further refills., Disp: 90 capsule, Rfl: 0 .  ranolazine (RANEXA) 500 MG 12 hr tablet, Take 1 tablet (500 mg total) by mouth 2 (two) times daily., Disp: 60 tablet, Rfl: 11  Past Medical History: Past Medical History:  Diagnosis Date  . Anginal pain ([place])   . Arthritis   . Barrett esophagus   . Carotid arterial disease ([place])    a. 2011 s/p R CEA.  . Coronary artery disease    a. 2000 s/p PCI/BMS x 2 of OM2 [name], [place]); b. 12/2018  MV: EF 43% (likely 2/2 GI uptake artifact No ischemia. Low risk; c. 03/2019 PCI: LM nl, LAD 14m LCX nl, OM2 90 ISR, RCA 40p, 860m3.5x15 Resolute Onyx DES), RPL2 70. d. 06/04/2019 LHC 1. severe OM2 (suspected in stent restenosis), mild to mod dz LAD & RCA, patent mRCA stent, nl LV pressure, balloon angio to OM2 90p to 40p  . Depression   . Diabetes mellitus without complication ([place]  . Diastasis recti   . GERD (gastroesophageal reflux disease)   . History of hiatal hernia   . History of kidney stones   . History of tobacco abuse   . Hyperlipidemia LDL goal <70   . Hypertension   . Morbid obesity ([place]  . Osteoarthritis   . Sleep apnea     Tobacco Use: Social History   Tobacco Use  Smoking Status Former Smoker  . Types: Cigarettes, E-cigarettes  .  Quit date: 10/17/2018  . Years since quitting: 1.5  Smokeless Tobacco Never Used  Tobacco Comment   quit vaping 10/2018. quit smoking 2016    Labs: Recent Review Flowsheet Data    Labs for ITP Cardiac and Pulmonary Rehab Latest Ref Rng & Units 01/01/2019   Cholestrol 100 - 199 mg/dL 151   LDLCALC 0 - 99 mg/dL 76   HDL >39 mg/dL 44   Trlycerides 0 - 149 mg/dL 154(H)   Hemoglobin A1c 4.8 - 5.6 % 6.2(H)       Exercise Target Goals: Exercise Program Goal: Individual exercise prescription set using results from initial 6 min walk test and THRR while considering  patient's activity barriers and safety.   Exercise Prescription Goal: Initial exercise prescription builds to 30-45 minutes a day of aerobic activity, 2-3 days per week.  Home exercise guidelines will be given to patient during program as part of exercise prescription that the participant will acknowledge.   Education: Aerobic Exercise & Resistance Training: - Gives group verbal and written instruction on the various components of exercise. Focuses on aerobic and resistive training programs and the benefits of this training and how to safely progress through these programs..   Education: Exercise & Equipment Safety: - Individual verbal instruction and demonstration of equipment use and safety with use of the equipment.   [facility]  Date 03/19/20  [facility] Review Code 1- Verbalizes Understanding      Education: Exercise Physiology & General Exercise Guidelines: - Group verbal and written instruction with models to review the exercise physiology of the cardiovascular system and associated critical values. Provides general exercise guidelines with specific guidelines to those with heart or lung disease.    Education: Flexibility, Balance, Mind/Body Relaxation: Provides group verbal/written instruction on the benefits of flexibility and balance training,  including mind/body exercise modes such as yoga, pilates and tai chi.  Demonstration and skill practice provided.   Activity Barriers & Risk Stratification:  Activity Barriers & Cardiac Risk Stratification - 03/19/20 0929      Activity Barriers & Cardiac Risk Stratification   Activity Barriers Joint Problems;Other (comment);Back Problems;Chest Pain/Angina;Shortness of Breath;Balance Concerns;Deconditioning;Muscular Weakness    Comments Right knee  needs replacement, back with spinal stenosis    Cardiac Risk Stratification High           6 Minute Walk:  6 Minute Walk    Row Name 03/19/20 0926         6 Minute Walk   Phase Initial  Distance 668 feet     Walk Time 2.75 minutes     # of Rest Breaks 0  stopped due to chest pain     MPH 2.76     METS 2.06     RPE 17     Perceived Dyspnea  3     VO2 Peak 7.19     Symptoms Yes (comment)     Comments chest pain coming in from car 2/10, chest pain with walk test 5/10 with hypertensive response, lightheaded, headache     Resting HR 62 bpm     Resting BP 144/82     Resting Oxygen Saturation  99 %     Exercise Oxygen Saturation  during 6 min walk 96 %     Max Ex. HR 104 bpm     Max Ex. BP 214/98     2 Minute Post BP 206/94  rck 156/80            Oxygen Initial Assessment:   Oxygen Re-Evaluation:   Oxygen Discharge (Final Oxygen Re-Evaluation):   Initial Exercise Prescription:  Initial Exercise Prescription - 03/19/20 1000      Date of Initial Exercise RX and Referring Provider   Date 03/19/20    Referring Provider Frazier Richards MD      Treadmill   MPH 2    Grade 0.5    Minutes 15    METs 2.67      NuStep   Level 1    SPM 80    Minutes 15    METs 2      REL-XR   Level 2    Speed 50    Minutes 15    METs 2      Biostep-RELP   Level 1    SPM 50    Minutes 15    METs 2      Prescription Details   Frequency (times per week) 2    Duration Progress to 30 minutes of continuous aerobic without  signs/symptoms of physical distress      Intensity   THRR 40-80% of Max Heartrate 99-136    Ratings of Perceived Exertion 11-13    Perceived Dyspnea 0-4      Progression   Progression Continue to progress workloads to maintain intensity without signs/symptoms of physical distress.      Resistance Training   Training Prescription Yes    Weight 3 lb    Reps 10-15           Perform Capillary Blood Glucose checks as needed.  Exercise Prescription Changes:  Exercise Prescription Changes    Row Name 03/19/20 1000 04/07/20 1400 04/16/20 0800 04/21/20 1400       Response to Exercise   Blood Pressure (Admit) 144/82 110/62 -- 128/70    Blood Pressure (Exercise) 214/98  202/94, 156/80 222/80  146/62 -- 188/68    Blood Pressure (Exit) 136/80 100/60 -- 96/62    Heart Rate (Admit) 62 bpm 76 bpm -- 67 bpm    Heart Rate (Exercise) 104 bpm 98 bpm -- 109 bpm    Heart Rate (Exit) 68 bpm 66 bpm -- 76 bpm    Oxygen Saturation (Admit) 99 % -- -- --    Oxygen Saturation (Exercise) 98 % -- -- --    Rating of Perceived Exertion (Exercise) 17 -- -- 12    Perceived Dyspnea (Exercise) 3 -- -- --    Symptoms chest pain (2/10 on arrival) (5/10 walk test), lightheaded, headache  angina 6/10  decreased to 2/10 -- angina 4/10    Comments walk test results -- -- --    Duration -- -- -- Continue with 30 min of aerobic exercise without signs/symptoms of physical distress.    Intensity -- -- -- THRR unchanged      Progression   Progression -- Continue to progress workloads to maintain intensity without signs/symptoms of physical distress. -- Continue to progress workloads to maintain intensity without signs/symptoms of physical distress.    Average METs -- 2.67 -- 3.08      Resistance Training   Training Prescription -- Yes -- Yes    Weight -- 3 lb -- 5 lb    Reps -- 10-15 -- 10-15      Interval Training   Interval Training -- No -- No      Treadmill   MPH -- 2 -- 2    Grade -- 0.5 -- 0.5     Minutes -- 15 -- 15    METs -- 2.67 -- 2.67      REL-XR   Level -- 1 -- 1    Speed -- 50 -- --    Minutes -- 15 -- 15    METs -- -- -- 3.5      Home Exercise Plan   Plans to continue exercise at -- -- Home (comment)  walk Home (comment)  walk    Frequency -- -- Add 2 additional days to program exercise sessions. Add 2 additional days to program exercise sessions.    Initial Home Exercises Provided -- -- 04/16/20 04/16/20           Exercise Comments:  Exercise Comments    Row Name 03/24/20 0854           Exercise Comments First full day of exercise!  Patient was oriented to gym and equipment including functions, settings, policies, and procedures.  Patient's individual exercise prescription and treatment plan were reviewed.  All starting workloads were established based on the results of the 6 minute walk test done at initial orientation visit.  The plan for exercise progression was also introduced and progression will be customized based on patient's performance and goals.              Exercise Goals and Review:  Exercise Goals    Row Name 03/19/20 1036             Exercise Goals   Increase Physical Activity Yes       Intervention Provide advice, education, support and counseling about physical activity/exercise needs.;Develop an individualized exercise prescription for aerobic and resistive training based on initial evaluation findings, risk stratification, comorbidities and participant's personal goals.       Expected Outcomes Short Term: Attend rehab on a regular basis to increase amount of physical activity.;Long Term: Add in home exercise to make exercise part of routine and to increase amount of physical activity.;Long Term: Exercising regularly at least 3-5 days a week.       Increase Strength and Stamina Yes       Intervention Provide advice, education, support and counseling about physical activity/exercise needs.;Develop an individualized exercise prescription  for aerobic and resistive training based on initial evaluation findings, risk stratification, comorbidities and participant's personal goals.       Expected Outcomes Short Term: Increase workloads from initial exercise prescription for resistance, speed, and METs.;Short Term: Perform resistance training exercises routinely during rehab and add in resistance training at home;Long Term: Improve cardiorespiratory fitness,  muscular endurance and strength as measured by increased METs and functional capacity (6MWT)       Able to understand and use rate of perceived exertion (RPE) scale Yes       Intervention Provide education and explanation on how to use RPE scale       Expected Outcomes Short Term: Able to use RPE daily in rehab to express subjective intensity level;Long Term:  Able to use RPE to guide intensity level when exercising independently       Able to understand and use Dyspnea scale Yes       Intervention Provide education and explanation on how to use Dyspnea scale       Expected Outcomes Short Term: Able to use Dyspnea scale daily in rehab to express subjective sense of shortness of breath during exertion;Long Term: Able to use Dyspnea scale to guide intensity level when exercising independently       Knowledge and understanding of Target Heart Rate Range (THRR) Yes       Intervention Provide education and explanation of THRR including how the numbers were predicted and where they are located for reference       Expected Outcomes Short Term: Able to use daily as guideline for intensity in rehab;Long Term: Able to use THRR to govern intensity when exercising independently;Short Term: Able to state/look up THRR       Able to check pulse independently Yes       Intervention Provide education and demonstration on how to check pulse in carotid and radial arteries.;Review the importance of being able to check your own pulse for safety during independent exercise       Expected Outcomes Short Term:  Able to explain why pulse checking is important during independent exercise;Long Term: Able to check pulse independently and accurately       Understanding of Exercise Prescription Yes       Intervention Provide education, explanation, and written materials on patient's individual exercise prescription       Expected Outcomes Short Term: Able to explain program exercise prescription;Long Term: Able to explain home exercise prescription to exercise independently              Exercise Goals Re-Evaluation :  Exercise Goals Re-Evaluation    Row Name 03/24/20 0854 04/07/20 1502 04/14/20 0819 04/16/20 0839 04/21/20 1448     Exercise Goal Re-Evaluation   Exercise Goals Review Able to understand and use rate of perceived exertion (RPE) scale;Knowledge and understanding of Target Heart Rate Range (THRR);Understanding of Exercise Prescription Able to understand and use rate of perceived exertion (RPE) scale;Knowledge and understanding of Target Heart Rate Range (THRR);Understanding of Exercise Prescription Increase Physical Activity;Increase Strength and Stamina Increase Physical Activity;Increase Strength and Stamina;Able to understand and use rate of perceived exertion (RPE) scale;Able to understand and use Dyspnea scale;Knowledge and understanding of Target Heart Rate Range (THRR);Able to check pulse independently;Understanding of Exercise Prescription Increase Physical Activity;Increase Strength and Stamina;Understanding of Exercise Prescription   Comments Reviewed RPE and dyspnea scales, THR and program prescription with pt today.  Pt voiced understanding and was given a copy of goals to take home. Mikki Santee has had a coupple episodes of high BP during exercise.  BP came down with rest.  He has also had some angina.  Staff will monitor his progress. Lamario is not exercising much at home since he is having alot of chest pain and knee pain. He is going to see his cardiologist today to get some answers  about his  chest pain. Reviewed home exercise with pt today.  Pt plans to walk at home for exercise.  Reviewed THR, pulse, RPE, sign and symptoms, pulse oximetery and when to call 911 or MD.  Also discussed weather considerations and indoor options.  Pt voiced understanding. Gianmarco has been doing well in rehab.  He still has days where his angina will flare up and he has a hypertiensive response.  But overall, he is doing well.  We will continue to montior his progress.   Expected Outcomes Short: Use RPE daily to regulate intensity. Long: Follow program prescription in THR. Short: be able to exercise without angina/ high BP Long: improve stamina Short: see cardiologist and ask what he is able to do at home for exercise. Long: maintain an exercise routine at home independently Short: Continue to talk wiht cardiologist about chest pain  Long: Continue to exercise independently. Short: Continue to monitor chest pain and BP closely Long: Continue to improve stamina.          Discharge Exercise Prescription (Final Exercise Prescription Changes):  Exercise Prescription Changes - 04/21/20 1400      Response to Exercise   Blood Pressure (Admit) 128/70    Blood Pressure (Exercise) 188/68    Blood Pressure (Exit) 96/62    Heart Rate (Admit) 67 bpm    Heart Rate (Exercise) 109 bpm    Heart Rate (Exit) 76 bpm    Rating of Perceived Exertion (Exercise) 12    Symptoms angina 4/10    Duration Continue with 30 min of aerobic exercise without signs/symptoms of physical distress.    Intensity THRR unchanged      Progression   Progression Continue to progress workloads to maintain intensity without signs/symptoms of physical distress.    Average METs 3.08      Resistance Training   Training Prescription Yes    Weight 5 lb    Reps 10-15      Interval Training   Interval Training No      Treadmill   MPH 2    Grade 0.5    Minutes 15    METs 2.67      REL-XR   Level 1    Minutes 15    METs 3.5      Home  Exercise Plan   Plans to continue exercise at Home (comment)   walk   Frequency Add 2 additional days to program exercise sessions.    Initial Home Exercises Provided 04/16/20           Nutrition:  Target Goals: Understanding of nutrition guidelines, daily intake of sodium <156m, cholesterol <2058m calories 30% from fat and 7% or less from saturated fats, daily to have 5 or more servings of fruits and vegetables.  Education: Controlling Sodium/Reading Food Labels -Group verbal and written material supporting the discussion of sodium use in heart healthy nutrition. Review and explanation with models, verbal and written materials for utilization of the food label.   Education: General Nutrition Guidelines/Fats and Fiber: -Group instruction provided by verbal, written material, models and posters to present the general guidelines for heart healthy nutrition. Gives an explanation and review of dietary fats and fiber.   Biometrics:  Pre Biometrics - 03/19/20 1038      Pre Biometrics   Height 5' 7.75" (1.721 m)    Weight 257 lb 1.6 oz (116.6 kg)    BMI (Calculated) 39.37    Single Leg Stand 30 seconds  Nutrition Therapy Plan and Nutrition Goals:  Nutrition Therapy & Goals - 03/31/20 0821      Nutrition Therapy   Diet Low Na, heart healthy, diabetes    Drug/Food Interactions Statins/Certain Fruits    Protein (specify units) 95g    Fiber 30 grams    Whole Grain Foods 3 servings    Saturated Fats 12 max. grams    Fruits and Vegetables 5 servings/day    Sodium 1.5 grams      Personal Nutrition Goals   Nutrition Goal ST: Avoid "naked" CHOs, choose more complex CHOs LT: control BG (120 usually in AM)   Sleep apnea better now wearing CPAP   Comments D: baked cod with peas and carrots S: 2 small nectarines, 7 prunes (8-8:30). Wife has been very supportive with health. Discussed heart healthy and diabetes friendly.      Intervention Plan   Intervention Prescribe,  educate and counsel regarding individualized specific dietary modifications aiming towards targeted core components such as weight, hypertension, lipid management, diabetes, heart failure and other comorbidities.;Nutrition handout(s) given to patient.    Expected Outcomes Short Term Goal: Understand basic principles of dietary content, such as calories, fat, sodium, cholesterol and nutrients.;Short Term Goal: A plan has been developed with personal nutrition goals set during dietitian appointment.;Long Term Goal: Adherence to prescribed nutrition plan.           Nutrition Assessments:  Nutrition Assessments - 03/19/20 1040      MEDFICTS Scores   Pre Score 23           MEDIFICTS Score Key:          ?70 Need to make dietary changes          40-70 Heart Healthy Diet         ? 40 Therapeutic Level Cholesterol Diet  Nutrition Goals Re-Evaluation:  Nutrition Goals Re-Evaluation    [place] Name 04/14/20 0818             Goals   Current Weight 257 lb (116.6 kg)       Nutrition Goal Lose weight, maintain diabetes.       Comment Breion is watching more of what he eats and what food turn into sugar for his dabetes.       Expected Outcome Short: eat a heart healthy diet. Long: maintain a diet to help bring down his A1C.              Nutrition Goals Discharge (Final Nutrition Goals Re-Evaluation):  Nutrition Goals Re-Evaluation - 04/14/20 0818      Goals   Current Weight 257 lb (116.6 kg)    Nutrition Goal Lose weight, maintain diabetes.    Comment Rangel is watching more of what he eats and what food turn into sugar for his dabetes.    Expected Outcome Short: eat a heart healthy diet. Long: maintain a diet to help bring down his A1C.           Psychosocial: Target Goals: Acknowledge presence or absence of significant depression and/or stress, maximize coping skills, provide positive support system. Participant is able to verbalize types and ability to use techniques and skills  needed for reducing stress and depression.   Education: Depression - Provides group verbal and written instruction on the correlation between heart/lung disease and depressed mood, treatment options, and the stigmas associated with seeking treatment.   Education: Sleep Hygiene -Provides group verbal and written instruction about how sleep can affect your health.  Define  sleep hygiene, discuss sleep cycles and impact of sleep habits. Review good sleep hygiene tips.     Education: Stress and Anxiety: - Provides group verbal and written instruction about the health risks of elevated stress and causes of high stress.  Discuss the correlation between heart/lung disease and anxiety and treatment options. Review healthy ways to manage with stress and anxiety.    Initial Review & Psychosocial Screening:  Initial Psych Review & Screening - 03/18/20 1116      Initial Review   Current issues with Current Stress Concerns;Current Psychotropic Meds    Source of Stress Concerns Chronic Illness    Comments Stress of the illness and has increased Lexapro recently.      Family Dynamics   Good Support System? Yes   Wife, family     Barriers   Psychosocial barriers to participate in program There are no identifiable barriers or psychosocial needs.;The patient should benefit from training in stress management and relaxation.      Screening Interventions   Interventions Encouraged to exercise;Provide feedback about the scores to participant;To provide support and resources with identified psychosocial needs    Expected Outcomes Short Term goal: Utilizing psychosocial counselor, staff and physician to assist with identification of specific Stressors or current issues interfering with healing process. Setting desired goal for each stressor or current issue identified.;Long Term Goal: Stressors or current issues are controlled or eliminated.;Short Term goal: Identification and review with participant of any  Quality of Life or Depression concerns found by scoring the questionnaire.;Long Term goal: The participant improves quality of Life and PHQ9 Scores as seen by post scores and/or verbalization of changes           Quality of Life Scores:   Quality of Life - 03/19/20 1039      Quality of Life   Select Quality of Life      Quality of Life Scores   Health/Function Pre 20.9 %    Socioeconomic Pre 22.87 %    Psych/Spiritual Pre 21.14 %    Family Pre 29.5 %    GLOBAL Pre 22.46 %          Scores of 19 and below usually indicate a poorer quality of life in these areas.  A difference of  2-3 points is a clinically meaningful difference.  A difference of 2-3 points in the total score of the Quality of Life Index has been associated with significant improvement in overall quality of life, self-image, physical symptoms, and general health in studies assessing change in quality of life.  PHQ-9: Recent Review Flowsheet Data    Depression screen [facility] 2/9 03/19/2020 05/14/2019   Decreased Interest 1 1   Down, Depressed, Hopeless 1 0   PHQ - 2 Score 2 1   Altered sleeping 0 0   Tired, decreased energy 1 2   Change in appetite 1 0   Feeling bad or failure about yourself  0 0   Trouble concentrating 0 0   Moving slowly or fidgety/restless 0 0   Suicidal thoughts 0 0   PHQ-9 Score 4 3   Difficult doing work/chores Not difficult at all Not difficult at all     Interpretation of Total Score  Total Score Depression Severity:  1-4 = Minimal depression, 5-9 = Mild depression, 10-14 = Moderate depression, 15-19 = Moderately severe depression, 20-27 = Severe depression   Psychosocial Evaluation and Intervention:  Psychosocial Evaluation - 03/18/20 1132      Psychosocial Evaluation &  Interventions   Interventions Encouraged to exercise with the program and follow exercise prescription;Relaxation education;Stress management education    Comments Roberthas no barriers to entering the program.  He has  stress concerns with his chronic symptoms and BP elevations. He recently had his Lexapro dose increased from 10 to 20 mg and stated that it is helping with the anxiety of his uncontrolled BP. He also stated that his cardiologist recently told him  that they will continue to search for a reason why his BP goes so high without warning. He was encouraged to hear that his doctor has not stopped trying to figure out what is causing his health issues.  Milo lives in his home with his wife. He has a great support system with his wife and family and church family. He has not retired yet, and is presently  on short term disabilty to soon return to work. Jamus should do well with the program .    Expected Outcomes STG: Jeral will be able to attend on consistent basis and manage to work on equipment that will not aggravate his knee or back problems.    LTG: Lebaron will be able to continue the lifestyle changes he learned and started in class.    Continue Psychosocial Services  Follow up required by staff           Psychosocial Re-Evaluation:  Psychosocial Re-Evaluation    Henrietta Name 04/14/20 0813             Psychosocial Re-Evaluation   Current issues with Current Stress Concerns;Current Depression;History of Depression;Current Psychotropic Meds       Comments Selden has chest pain frequently and his systolic blood pressure is getting high and he is worried that he is going to have another heart attack. He is wearing a heart monitor currently for a month. He is taking medication to help with his depression and anxiety.       Expected Outcomes Short: talk with cardiologist to help get answers about his health. Long: continue medications and HeartTrack to deal with stressors.       Interventions Encouraged to attend Cardiac Rehabilitation for the exercise       Continue Psychosocial Services  Follow up required by staff              Psychosocial Discharge (Final Psychosocial Re-Evaluation):   Psychosocial Re-Evaluation - 04/14/20 0813      Psychosocial Re-Evaluation   Current issues with Current Stress Concerns;Current Depression;History of Depression;Current Psychotropic Meds    Comments Hesston has chest pain frequently and his systolic blood pressure is getting high and he is worried that he is going to have another heart attack. He is wearing a heart monitor currently for a month. He is taking medication to help with his depression and anxiety.    Expected Outcomes Short: talk with cardiologist to help get answers about his health. Long: continue medications and HeartTrack to deal with stressors.    Interventions Encouraged to attend Cardiac Rehabilitation for the exercise    Continue Psychosocial Services  Follow up required by staff           Vocational Rehabilitation: Provide vocational rehab assistance to qualifying candidates.   Vocational Rehab Evaluation & Intervention:  Vocational Rehab - 03/18/20 1119      Initial Vocational Rehab Evaluation & Intervention   Assessment shows need for Vocational Rehabilitation No           Education: Education Goals: Education classes will be provided  on a variety of topics geared toward better understanding of heart health and risk factor modification. Participant will state understanding/return demonstration of topics presented as noted by education test scores.  Learning Barriers/Preferences:  Learning Barriers/Preferences - 03/18/20 1119      Learning Barriers/Preferences   Learning Barriers None    Learning Preferences None           General Cardiac Education Topics:  AED/CPR: - Group verbal and written instruction with the use of models to demonstrate the basic use of the AED with the basic ABC's of resuscitation.   Anatomy & Physiology of the Heart: - Group verbal and written instruction and models provide basic cardiac anatomy and physiology, with the coronary electrical and arterial systems. Review of  Valvular disease and Heart Failure   Cardiac Procedures: - Group verbal and written instruction to review commonly prescribed medications for heart disease. Reviews the medication, class of the drug, and side effects. Includes the steps to properly store meds and maintain the prescription regimen. (beta blockers and nitrates)   Cardiac Medications I: - Group verbal and written instruction to review commonly prescribed medications for heart disease. Reviews the medication, class of the drug, and side effects. Includes the steps to properly store meds and maintain the prescription regimen.   Cardiac Medications II: -Group verbal and written instruction to review commonly prescribed medications for heart disease. Reviews the medication, class of the drug, and side effects. (all other drug classes)    Go Sex-Intimacy & Heart Disease, Get SMART - Goal Setting: - Group verbal and written instruction through game format to discuss heart disease and the return to sexual intimacy. Provides group verbal and written material to discuss and apply goal setting through the application of the S.M.A.R.T. Method.   Other Matters of the Heart: - Provides group verbal, written materials and models to describe Stable Angina and Peripheral Artery. Includes description of the disease process and treatment options available to the cardiac patient.   Infection Prevention: - Provides verbal and written material to individual with discussion of infection control including proper hand washing and proper equipment cleaning during exercise session.   Cardiac Rehab from 03/19/2020 in [facility] Cardiac and Pulmonary Rehab  Date 03/19/20  Educator [facility]  Instruction Review Code 1- Verbalizes Understanding      Falls Prevention: - Provides verbal and written material to individual with discussion of falls prevention and safety.   Cardiac Rehab from 03/19/2020 in [facility] Cardiac and Pulmonary Rehab  Date 03/19/20  Educator [facility]   Instruction Review Code 1- Verbalizes Understanding      Other: -Provides group and verbal instruction on various topics (see comments)   Knowledge Questionnaire Score:  Knowledge Questionnaire Score - 03/19/20 1040      Knowledge Questionnaire Score   Pre Score 23/26 Education Focus: Nutrition and Angina           Core Components/Risk Factors/Patient Goals at Admission:  Personal Goals and Risk Factors at Admission - 03/19/20 1040      Core Components/Risk Factors/Patient Goals on Admission    Weight Management Yes;Weight Loss;Obesity    Intervention Weight Management: Develop a combined nutrition and exercise program designed to reach desired caloric intake, while maintaining appropriate intake of nutrient and fiber, sodium and fats, and appropriate energy expenditure required for the weight goal.;Weight Management: Provide education and appropriate resources to help participant work on and attain dietary goals.;Obesity: Provide education and appropriate resources to help participant work on and attain dietary goals.;Weight Management/Obesity:  Establish reasonable short term and long term weight goals.    Admit Weight 257 lb 1.6 oz (116.6 kg)    Goal Weight: Short Term 252 lb (114.3 kg)    Goal Weight: Long Term 185 lb (83.9 kg)    Expected Outcomes Short Term: Continue to assess and modify interventions until short term weight is achieved;Long Term: Adherence to nutrition and physical activity/exercise program aimed toward attainment of established weight goal;Weight Loss: Understanding of general recommendations for a balanced deficit meal plan, which promotes 1-2 lb weight loss per week and includes a negative energy balance of [phone] kcal/d;Understanding recommendations for meals to include 15-35% energy as protein, 25-35% energy from fat, 35-60% energy from carbohydrates, less than 259m of dietary cholesterol, 20-35 gm of total fiber daily;Understanding of distribution of  calorie intake throughout the day with the consumption of 4-5 meals/snacks    Diabetes Yes    Intervention Provide education about signs/symptoms and action to take for hypo/hyperglycemia.;Provide education about proper nutrition, including hydration, and aerobic/resistive exercise prescription along with prescribed medications to achieve blood glucose in normal ranges: Fasting glucose 65-99 mg/dL    Expected Outcomes Short Term: Participant verbalizes understanding of the signs/symptoms and immediate care of hyper/hypoglycemia, proper foot care and importance of medication, aerobic/resistive exercise and nutrition plan for blood glucose control.;Long Term: Attainment of HbA1C < 7%.    Hypertension Yes    Intervention Provide education on lifestyle modifcations including regular physical activity/exercise, weight management, moderate sodium restriction and increased consumption of fresh fruit, vegetables, and low fat dairy, alcohol moderation, and smoking cessation.;Monitor prescription use compliance.    Expected Outcomes Short Term: Continued assessment and intervention until BP is < 140/938mHG in hypertensive participants. < 130/8030mG in hypertensive participants with diabetes, heart failure or chronic kidney disease.;Long Term: Maintenance of blood pressure at goal levels.    Lipids Yes    Intervention Provide education and support for participant on nutrition & aerobic/resistive exercise along with prescribed medications to achieve LDL <55m76mDL >40mg19m Expected Outcomes Short Term: Participant states understanding of desired cholesterol values and is compliant with medications prescribed. Participant is following exercise prescription and nutrition guidelines.;Long Term: Cholesterol controlled with medications as prescribed, with individualized exercise RX and with personalized nutrition plan. Value goals: LDL < 55mg,100m > 40 mg.           Education:Diabetes - Individual verbal and  written instruction to review signs/symptoms of diabetes, desired ranges of glucose level fasting, after meals and with exercise. Acknowledge that pre and post exercise glucose checks will be done for 3 sessions at entry of program.   [facility]  Date 03/19/20  [facility]- Verbalizes Understanding      Education: Know Your Numbers and Risk Factors: -Group verbal and written instruction about important numbers in your health.  Discussion of what are risk factors and how they play a role in the disease process.  Review of Cholesterol, Blood Pressure, Diabetes, and BMI and the role they play in your overall health.   Core Components/Risk Factors/Patient Goals Review:   Goals and Risk Factor Review    Row Name 04/14/20 0809             Core Components/Risk Factors/Patient Goals Review   Personal Goals Review Weight Management/Obesity;Diabetes;Lipids;Hypertension       Review RobertKietheen having chest pain and has been having bouts with high blood  pressure at times. He is meeting with his cardiologist today for a follow up appointment. He has been doing well with his diabetes and is monitoring his sugar at home. His on going chest pain is his main concern at this point in time.       Expected Outcomes Short: meet with cardiologist  Long: maintain chest pain and blood pressure independently              Core Components/Risk Factors/Patient Goals at Discharge (Final Review):   Goals and Risk Factor Review - 04/14/20 0809      Core Components/Risk Factors/Patient Goals Review   Personal Goals Review Weight Management/Obesity;Diabetes;Lipids;Hypertension    Review Merton has been having chest pain and has been having bouts with high blood pressure at times. He is meeting with his cardiologist today for a follow up appointment. He has been doing well with his diabetes and is monitoring his sugar at home. His  on going chest pain is his main concern at this point in time.    Expected Outcomes Short: meet with cardiologist  Long: maintain chest pain and blood pressure independently           ITP Comments:  ITP Comments    Row Name 03/18/20 1129 03/19/20 0926 03/24/20 0854 04/01/20 0652 04/09/20 0826   ITP Comments Virtual Orientation completed today. Has an appt for EP Eval and gym orientation tomorrow at 8 AM. Documentation of Diagnosis can be found in [facility] 02/17/2020 Completed 6MWT and gym orientation.  Initial ITP created and sent for review to Dr. Emily Filbert, Medical Director. First full day of exercise!  Patient was oriented to gym and equipment including functions, settings, policies, and procedures.  Patient's individual exercise prescription and treatment plan were reviewed.  All starting workloads were established based on the results of the 6 minute walk test done at initial orientation visit.  The plan for exercise progression was also introduced and progression will be customized based on patient's performance and goals. 30 Day review completed. Medical Director ITP review done, changes made as directed, and signed approval by Medical Director. Rasul is wearing a heart monitor for a little while.   [place] Name 04/29/20 1116           ITP Comments 30 Day review completed. Medical Director ITP review done, changes made as directed, and signed approval by Medical Director.              Comments:

## 2020-05-05 ENCOUNTER — Encounter: Payer: BC Managed Care – PPO | Admitting: *Deleted

## 2020-05-05 ENCOUNTER — Other Ambulatory Visit: Payer: Self-pay

## 2020-05-05 DIAGNOSIS — Z955 Presence of coronary angioplasty implant and graft: Secondary | ICD-10-CM

## 2020-05-05 NOTE — Progress Notes (Signed)
Daily Session Note  Patient Details  Name: Brandon Henderson MRN: [id number] Date of Birth: 1956-05-24 Referring Provider:     Cardiac Rehab from 03/19/2020 in [facility] Cardiac and Pulmonary Rehab  Referring Provider Frazier Richards MD      Encounter Date: 05/05/2020  Check In:  Session Check In - 05/05/20 0752      Check-In   Supervising physician immediately available to respond to emergencies See telemetry face sheet for immediately available ER MD    Location [facility]-Cardiac & Pulmonary Rehab    Staff Present Nyoka Cowden, RN, BSN, Tyna Jaksch, MS Exercise Physiologist;Amanda Oletta Darter, BA, ACSM CEP, Exercise Physiologist    Virtual Visit No    Medication changes reported     Yes    Comments Started Eliquis    Fall or balance concerns reported    No    Tobacco Cessation No Change    Warm-up and Cool-down Performed on first and last piece of equipment    Resistance Training Performed Yes      Pain Assessment   Currently in Pain? No/denies              Social History   Tobacco Use  Smoking Status Former Smoker  . Types: Cigarettes, E-cigarettes  . Quit date: 10/17/2018  . Years since quitting: 1.5  Smokeless Tobacco Never Used  Tobacco Comment   quit vaping 10/2018. quit smoking 2016    Goals Met:  Independence with exercise equipment Exercise tolerated well No report of cardiac concerns or symptoms Strength training completed today  Goals Unmet:  Not Applicable  Comments: Pt able to follow exercise prescription today without complaint.  Will continue to monitor for progression.    Dr. Emily Filbert is Medical Director for [place] and [name].

## 2020-05-07 ENCOUNTER — Other Ambulatory Visit: Payer: Self-pay

## 2020-05-07 ENCOUNTER — Encounter: Payer: BC Managed Care – PPO | Admitting: *Deleted

## 2020-05-07 DIAGNOSIS — Z955 Presence of coronary angioplasty implant and graft: Secondary | ICD-10-CM

## 2020-05-07 NOTE — Progress Notes (Signed)
Daily Session Note  Patient Details  Name: Brandon Henderson MRN: [id number] Date of Birth: 12-18-1955 Referring Provider:     Cardiac Rehab from 03/19/2020 in [facility] Cardiac and Pulmonary Rehab  Referring Provider Frazier Richards MD      Encounter Date: 05/07/2020  Check In:  Session Check In - 05/07/20 0815      Check-In   Supervising physician immediately available to respond to emergencies See telemetry face sheet for immediately available ER MD    Location [facility]-Cardiac & Pulmonary Rehab    Staff Present Heath Lark, RN, BSN, CCRP;Jessica K[place], MA, RCEP, CCRP, [place], [place], ACSM CEP, Exercise Physiologist    Virtual Visit No    Medication changes reported     No    Fall or balance concerns reported    No    Warm-up and Cool-down Performed on first and last piece of equipment    Resistance Training Performed Yes    VAD Patient? No    PAD/SET Patient? No      Pain Assessment   Currently in Pain? No/denies              Social History   Tobacco Use  Smoking Status Former Smoker  . Types: Cigarettes, E-cigarettes  . Quit date: 10/17/2018  . Years since quitting: 1.5  Smokeless Tobacco Never Used  Tobacco Comment   quit vaping 10/2018. quit smoking 2016    Goals Met:  Independence with exercise equipment Exercise tolerated well No report of cardiac concerns or symptoms  Goals Unmet:  Not Applicable  Comments: Pt able to follow exercise prescription today without complaint.  Will continue to monitor for progression.    Dr. Emily Filbert is Medical Director for [place] and [name].

## 2020-05-12 ENCOUNTER — Telehealth: Payer: Self-pay

## 2020-05-12 NOTE — Telephone Encounter (Signed)
Brandon Henderson was up a lot last night due to leg cramps - he thinks from being back at work and standing.  He plasn to return next week.

## 2020-05-26 ENCOUNTER — Encounter: Payer: Medicare Other | Attending: Internal Medicine | Admitting: *Deleted

## 2020-05-26 ENCOUNTER — Other Ambulatory Visit: Payer: Self-pay

## 2020-05-26 DIAGNOSIS — G4730 Sleep apnea, unspecified: Secondary | ICD-10-CM | POA: Diagnosis not present

## 2020-05-26 DIAGNOSIS — F329 Major depressive disorder, single episode, unspecified: Secondary | ICD-10-CM | POA: Insufficient documentation

## 2020-05-26 DIAGNOSIS — Z79899 Other long term (current) drug therapy: Secondary | ICD-10-CM | POA: Insufficient documentation

## 2020-05-26 DIAGNOSIS — E785 Hyperlipidemia, unspecified: Secondary | ICD-10-CM | POA: Diagnosis not present

## 2020-05-26 DIAGNOSIS — Z7982 Long term (current) use of aspirin: Secondary | ICD-10-CM | POA: Insufficient documentation

## 2020-05-26 DIAGNOSIS — Z87891 Personal history of nicotine dependence: Secondary | ICD-10-CM | POA: Diagnosis not present

## 2020-05-26 DIAGNOSIS — M1990 Unspecified osteoarthritis, unspecified site: Secondary | ICD-10-CM | POA: Insufficient documentation

## 2020-05-26 DIAGNOSIS — Z6839 Body mass index (BMI) 39.0-39.9, adult: Secondary | ICD-10-CM | POA: Diagnosis not present

## 2020-05-26 DIAGNOSIS — K219 Gastro-esophageal reflux disease without esophagitis: Secondary | ICD-10-CM | POA: Diagnosis not present

## 2020-05-26 DIAGNOSIS — Z87442 Personal history of urinary calculi: Secondary | ICD-10-CM | POA: Diagnosis not present

## 2020-05-26 DIAGNOSIS — Z955 Presence of coronary angioplasty implant and graft: Secondary | ICD-10-CM | POA: Diagnosis not present

## 2020-05-26 DIAGNOSIS — E119 Type 2 diabetes mellitus without complications: Secondary | ICD-10-CM | POA: Diagnosis not present

## 2020-05-26 DIAGNOSIS — I2510 Atherosclerotic heart disease of native coronary artery without angina pectoris: Secondary | ICD-10-CM | POA: Diagnosis not present

## 2020-05-26 DIAGNOSIS — Z7902 Long term (current) use of antithrombotics/antiplatelets: Secondary | ICD-10-CM | POA: Insufficient documentation

## 2020-05-26 DIAGNOSIS — I10 Essential (primary) hypertension: Secondary | ICD-10-CM | POA: Diagnosis not present

## 2020-05-26 NOTE — Progress Notes (Signed)
Daily Session Note  Patient Details  Name: Brandon Henderson MRN: [id number] Date of Birth: [birth year] Referring Provider:     Cardiac Rehab from 03/19/2020 in [facility] Cardiac and Pulmonary Rehab  Referring Provider Frazier Richards MD      Encounter Date: 05/26/2020  Check In:  Session Check In - 05/26/20 0846      Check-In   Supervising physician immediately available to respond to emergencies See telemetry face sheet for immediately available ER MD    Location [facility]-Cardiac & Pulmonary Rehab    Staff Present Heath Lark, RN, BSN, Jacklynn Bue, MS Exercise Physiologist;Amanda Oletta Darter, [place], ACSM CEP, Exercise Physiologist    Virtual Visit No    Medication changes reported     No    Fall or balance concerns reported    No    Warm-up and Cool-down Performed on first and last piece of equipment    Resistance Training Performed Yes    VAD Patient? No    PAD/SET Patient? No      Pain Assessment   Currently in Pain? No/denies              Social History   Tobacco Use  Smoking Status Former Smoker  . Types: Cigarettes, E-cigarettes  . Quit date: 10/17/2018  . Years since quitting: 1.6  Smokeless Tobacco Never Used  Tobacco Comment   quit vaping 10/2018. quit smoking 2016    Goals Met:  Independence with exercise equipment Exercise tolerated well No report of cardiac concerns or symptoms  Goals Unmet:  Not Applicable  Comments: Pt able to follow exercise prescription today without complaint.  Will continue to monitor for progression.    Dr. Emily Filbert is Medical Director for [place] and [name].

## 2020-05-27 ENCOUNTER — Encounter: Payer: Self-pay | Admitting: *Deleted

## 2020-05-27 DIAGNOSIS — Z955 Presence of coronary angioplasty implant and graft: Secondary | ICD-10-CM

## 2020-05-27 NOTE — Progress Notes (Signed)
Cardiac Individual Treatment Plan  Patient Details  Name: Brandon Henderson MRN: [id number] Date of Birth: 24-Jan-1956 Referring Provider:     Cardiac Rehab from 03/19/2020 in [facility] Cardiac and Pulmonary Rehab  Referring Provider Frazier Richards MD      Initial Encounter Date:    Cardiac Rehab from 03/19/2020 in [facility] Cardiac and Pulmonary Rehab  Date 03/19/20      Visit Diagnosis: Status post coronary artery stent placement  Patient's Home Medications on Admission:  Current Outpatient Medications:  .  Ascorbic Acid (VITAMIN C) 1000 MG tablet, Take 1,000 mg by mouth at bedtime. , Disp: , Rfl:  .  aspirin EC 81 MG tablet, Take 81 mg by mouth at bedtime. , Disp: , Rfl:  .  atorvastatin (LIPITOR) 40 MG tablet, Take 1 tablet (40 mg total) by mouth daily. Please call to schedule an appointment for further refills., Disp: 90 tablet, Rfl: 0 .  clopidogrel (PLAVIX) 75 MG tablet, Take 1 tablet (75 mg total) by mouth daily with breakfast. (Patient taking differently: Take 75 mg by mouth at bedtime. ), Disp: 90 tablet, Rfl: 3 .  Coenzyme Q10 (COQ10) 200 MG CAPS, Take 200 mg by mouth at bedtime., Disp: , Rfl:  .  diphenhydrAMINE (BENADRYL) 50 MG tablet, Take 1 tablet (50 mg) by mouth prior to leaving home to your procedure with a small sip of water., Disp: 1 tablet, Rfl: 0 .  escitalopram (LEXAPRO) 10 MG tablet, Take 10 mg by mouth at bedtime. , Disp: , Rfl:  .  ezetimibe (ZETIA) 10 MG tablet, Take 10 mg by mouth at bedtime. , Disp: , Rfl:  .  furosemide (LASIX) 40 MG tablet, Take 1 tablet (40 mg total) by mouth daily., Disp: 30 tablet, Rfl: 3 .  Glucosamine-Chondroitin (COSAMIN DS PO), Take 2 tablets by mouth at bedtime. , Disp: , Rfl:  .  isosorbide mononitrate (IMDUR) 30 MG 24 hr tablet, Take 1 tablet (30 mg total) by mouth daily. (Patient taking differently: Take 30 mg by mouth at bedtime. ), Disp: 90 tablet, Rfl: 3 .  magnesium oxide (MAG-OX) 400 MG tablet, Take 400 mg by mouth at bedtime.,  Disp: , Rfl:  .  metoprolol tartrate (LOPRESSOR) 25 MG tablet, Take 0.5 tablets (12.5 mg total) by mouth 2 (two) times daily., Disp: 90 tablet, Rfl: 3 .  Multiple Vitamin (MULTIVITAMIN WITH MINERALS) TABS tablet, Take 1 tablet by mouth at bedtime., Disp: , Rfl:  .  nitroGLYCERIN (NITROSTAT) 0.4 MG SL tablet, Place 1 tablet (0.4 mg total) under the tongue every 5 (five) minutes as needed for chest pain., Disp: 90 tablet, Rfl: 3 .  pantoprazole (PROTONIX) 40 MG tablet, Take 40 mg by mouth at bedtime. , Disp: , Rfl:  .  Psyllium (METAMUCIL PO), Take 1 Dose by mouth at bedtime., Disp: , Rfl:  .  ramipril (ALTACE) 5 MG capsule, Take 1 capsule (5 mg total) by mouth daily. Please call to schedule appointment for further refills., Disp: 90 capsule, Rfl: 0 .  ranolazine (RANEXA) 500 MG 12 hr tablet, Take 1 tablet (500 mg total) by mouth 2 (two) times daily., Disp: 60 tablet, Rfl: 11  Past Medical History: Past Medical History:  Diagnosis Date  . Anginal pain ([place])   . Arthritis   . Barrett esophagus   . Carotid arterial disease ([place])    a. 2011 s/p R CEA.  . Coronary artery disease    a. 2000 s/p PCI/BMS x 2 of OM2 [name], [place]); b. 12/2018  MV: EF 43% (likely 2/2 GI uptake artifact No ischemia. Low risk; c. 03/2019 PCI: LM nl, LAD 56m LCX nl, OM2 90 ISR, RCA 40p, 830m3.5x15 Resolute Onyx DES), RPL2 70. d. 06/04/2019 LHC 1. severe OM2 (suspected in stent restenosis), mild to mod dz LAD & RCA, patent mRCA stent, nl LV pressure, balloon angio to OM2 90p to 40p  . Depression   . Diabetes mellitus without complication ([place]  . Diastasis recti   . GERD (gastroesophageal reflux disease)   . History of hiatal hernia   . History of kidney stones   . History of tobacco abuse   . Hyperlipidemia LDL goal <70   . Hypertension   . Morbid obesity ([place]  . Osteoarthritis   . Sleep apnea     Tobacco Use: Social History   Tobacco Use  Smoking Status Former Smoker  . Types: Cigarettes, E-cigarettes  .  Quit date: 10/17/2018  . Years since quitting: 1.6  Smokeless Tobacco Never Used  Tobacco Comment   quit vaping 10/2018. quit smoking 2016    Labs: Recent Review Flowsheet Data    Labs for ITP Cardiac and Pulmonary Rehab Latest Ref Rng & Units 01/01/2019   Cholestrol 100 - 199 mg/dL 151   LDLCALC 0 - 99 mg/dL 76   HDL >39 mg/dL 44   Trlycerides 0 - 149 mg/dL 154(H)   Hemoglobin A1c 4.8 - 5.6 % 6.2(H)       Exercise Target Goals: Exercise Program Goal: Individual exercise prescription set using results from initial 6 min walk test and THRR while considering  patient's activity barriers and safety.   Exercise Prescription Goal: Initial exercise prescription builds to 30-45 minutes a day of aerobic activity, 2-3 days per week.  Home exercise guidelines will be given to patient during program as part of exercise prescription that the participant will acknowledge.   Education: Aerobic Exercise & Resistance Training: - Gives group verbal and written instruction on the various components of exercise. Focuses on aerobic and resistive training programs and the benefits of this training and how to safely progress through these programs..   Education: Exercise & Equipment Safety: - Individual verbal instruction and demonstration of equipment use and safety with use of the equipment.   [facility]  Date 03/19/20  [facility] Code 1- Verbalizes Understanding      Education: Exercise Physiology & General Exercise Guidelines: - Group verbal and written instruction with models to review the exercise physiology of the cardiovascular system and associated critical values. Provides general exercise guidelines with specific guidelines to those with heart or lung disease.    Education: Flexibility, Balance, Mind/Body Relaxation: Provides group verbal/written instruction on the benefits of flexibility and balance training,  including mind/body exercise modes such as yoga, pilates and tai chi.  Demonstration and skill practice provided.   Activity Barriers & Risk Stratification:  Activity Barriers & Cardiac Risk Stratification - 03/19/20 0929      Activity Barriers & Cardiac Risk Stratification   Activity Barriers Joint Problems;Other (comment);Back Problems;Chest Pain/Angina;Shortness of Breath;Balance Concerns;Deconditioning;Muscular Weakness    Comments Right knee  needs replacement, back with spinal stenosis    Cardiac Risk Stratification High           6 Minute Walk:  6 Minute Walk    Row Name 03/19/20 0926         6 Minute Walk   Phase Initial  Distance 668 feet     Walk Time 2.75 minutes     # of Rest Breaks 0  stopped due to chest pain     MPH 2.76     METS 2.06     RPE 17     Perceived Dyspnea  3     VO2 Peak 7.19     Symptoms Yes (comment)     Comments chest pain coming in from car 2/10, chest pain with walk test 5/10 with hypertensive response, lightheaded, headache     Resting HR 62 bpm     Resting BP 144/82     Resting Oxygen Saturation  99 %     Exercise Oxygen Saturation  during 6 min walk 96 %     Max Ex. HR 104 bpm     Max Ex. BP 214/98     2 Minute Post BP 206/94  rck 156/80            Oxygen Initial Assessment:   Oxygen Re-Evaluation:   Oxygen Discharge (Final Oxygen Re-Evaluation):   Initial Exercise Prescription:  Initial Exercise Prescription - 03/19/20 1000      Date of Initial Exercise RX and Referring Provider   Date 03/19/20    Referring Provider Frazier Richards MD      Treadmill   MPH 2    Grade 0.5    Minutes 15    METs 2.67      NuStep   Level 1    SPM 80    Minutes 15    METs 2      REL-XR   Level 2    Speed 50    Minutes 15    METs 2      Biostep-RELP   Level 1    SPM 50    Minutes 15    METs 2      Prescription Details   Frequency (times per week) 2    Duration Progress to 30 minutes of continuous aerobic without  signs/symptoms of physical distress      Intensity   THRR 40-80% of Max Heartrate 99-136    Ratings of Perceived Exertion 11-13    Perceived Dyspnea 0-4      Progression   Progression Continue to progress workloads to maintain intensity without signs/symptoms of physical distress.      Resistance Training   Training Prescription Yes    Weight 3 lb    Reps 10-15           Perform Capillary Blood Glucose checks as needed.  Exercise Prescription Changes:  Exercise Prescription Changes    Row Name 03/19/20 1000 04/07/20 1400 04/16/20 0800 04/21/20 1400 05/05/20 1300     Response to Exercise   Blood Pressure (Admit) 144/82 110/62 -- 128/70 144/64   Blood Pressure (Exercise) 214/98  202/94, 156/80 222/80  146/62 -- 188/68 184/76   Blood Pressure (Exit) 136/80 100/60 -- 96/62 112/62   Heart Rate (Admit) 62 bpm 76 bpm -- 67 bpm 89 bpm   Heart Rate (Exercise) 104 bpm 98 bpm -- 109 bpm 103 bpm   Heart Rate (Exit) 68 bpm 66 bpm -- 76 bpm 71 bpm   Oxygen Saturation (Admit) 99 % -- -- -- --   Oxygen Saturation (Exercise) 98 % -- -- -- --   Rating of Perceived Exertion (Exercise) 17 -- -- 12 15   Perceived Dyspnea (Exercise) 3 -- -- -- --   Symptoms chest pain (2/10 on arrival) (5/10 walk  test), lightheaded, headache angina 6/10  decreased to 2/10 -- angina 4/10 --   Comments walk test results -- -- -- --   Duration -- -- -- Continue with 30 min of aerobic exercise without signs/symptoms of physical distress. Continue with 30 min of aerobic exercise without signs/symptoms of physical distress.   Intensity -- -- -- THRR unchanged THRR unchanged     Progression   Progression -- Continue to progress workloads to maintain intensity without signs/symptoms of physical distress. -- Continue to progress workloads to maintain intensity without signs/symptoms of physical distress. Continue to progress workloads to maintain intensity without signs/symptoms of physical distress.   Average METs --  2.67 -- 3.08 2.9     Resistance Training   Training Prescription -- Yes -- Yes Yes   Weight -- 3 lb -- 5 lb 8 lb   Reps -- 10-15 -- 10-15 10-15     Interval Training   Interval Training -- No -- No No     Treadmill   MPH -- 2 -- 2 1.9   Grade -- 0.5 -- 0.5 0   Minutes -- 15 -- 15 15   METs -- 2.67 -- 2.67 --     REL-XR   Level -- 1 -- 1 4   Speed -- 50 -- -- 50   Minutes -- 15 -- 15 15   METs -- -- -- 3.5 3.3     Home Exercise Plan   Plans to continue exercise at -- -- Home (comment)  walk Home (comment)  walk Home (comment)  walk   Frequency -- -- Add 2 additional days to program exercise sessions. Add 2 additional days to program exercise sessions. Add 2 additional days to program exercise sessions.   Initial Home Exercises Provided -- -- 04/16/20 04/16/20 04/16/20   Row Name 05/20/20 1200             Response to Exercise   Blood Pressure (Admit) 120/56       Blood Pressure (Exercise) 204/64       Blood Pressure (Exit) 112/64       Heart Rate (Admit) 71 bpm       Heart Rate (Exercise) 114 bpm       Heart Rate (Exit) 77 bpm       Rating of Perceived Exertion (Exercise) 15       Symptoms angina 2/10       Duration Continue with 30 min of aerobic exercise without signs/symptoms of physical distress.       Intensity THRR unchanged         Progression   Progression Continue to progress workloads to maintain intensity without signs/symptoms of physical distress.       Average METs 3.53         Resistance Training   Training Prescription Yes       Weight 8 lb       Reps 10-15         Interval Training   Interval Training No         Treadmill   MPH 1.9       Grade 0       Minutes 15       METs 2.45         NuStep   Level 4       Minutes 15       METs 3.3         REL-XR   Level 4  Minutes 15       METs 3.3         Biostep-RELP   Level 4       Minutes 15       METs 4         Home Exercise Plan   Plans to continue exercise at Home (comment)   walk       Frequency Add 2 additional days to program exercise sessions.       Initial Home Exercises Provided 04/16/20              Exercise Comments:  Exercise Comments    Row Name 03/24/20 0854           Exercise Comments First full day of exercise!  Patient was oriented to gym and equipment including functions, settings, policies, and procedures.  Patient's individual exercise prescription and treatment plan were reviewed.  All starting workloads were established based on the results of the 6 minute walk test done at initial orientation visit.  The plan for exercise progression was also introduced and progression will be customized based on patient's performance and goals.              Exercise Goals and Review:  Exercise Goals    Row Name 03/19/20 1036             Exercise Goals   Increase Physical Activity Yes       Intervention Provide advice, education, support and counseling about physical activity/exercise needs.;Develop an individualized exercise prescription for aerobic and resistive training based on initial evaluation findings, risk stratification, comorbidities and participant's personal goals.       Expected Outcomes Short Term: Attend rehab on a regular basis to increase amount of physical activity.;Long Term: Add in home exercise to make exercise part of routine and to increase amount of physical activity.;Long Term: Exercising regularly at least 3-5 days a week.       Increase Strength and Stamina Yes       Intervention Provide advice, education, support and counseling about physical activity/exercise needs.;Develop an individualized exercise prescription for aerobic and resistive training based on initial evaluation findings, risk stratification, comorbidities and participant's personal goals.       Expected Outcomes Short Term: Increase workloads from initial exercise prescription for resistance, speed, and METs.;Short Term: Perform resistance training  exercises routinely during rehab and add in resistance training at home;Long Term: Improve cardiorespiratory fitness, muscular endurance and strength as measured by increased METs and functional capacity (6MWT)       Able to understand and use rate of perceived exertion (RPE) scale Yes       Intervention Provide education and explanation on how to use RPE scale       Expected Outcomes Short Term: Able to use RPE daily in rehab to express subjective intensity level;Long Term:  Able to use RPE to guide intensity level when exercising independently       Able to understand and use Dyspnea scale Yes       Intervention Provide education and explanation on how to use Dyspnea scale       Expected Outcomes Short Term: Able to use Dyspnea scale daily in rehab to express subjective sense of shortness of breath during exertion;Long Term: Able to use Dyspnea scale to guide intensity level when exercising independently       Knowledge and understanding of Target Heart Rate Range (THRR) Yes       Intervention Provide education and  explanation of THRR including how the numbers were predicted and where they are located for reference       Expected Outcomes Short Term: Able to use daily as guideline for intensity in rehab;Long Term: Able to use THRR to govern intensity when exercising independently;Short Term: Able to state/look up THRR       Able to check pulse independently Yes       Intervention Provide education and demonstration on how to check pulse in carotid and radial arteries.;Review the importance of being able to check your own pulse for safety during independent exercise       Expected Outcomes Short Term: Able to explain why pulse checking is important during independent exercise;Long Term: Able to check pulse independently and accurately       Understanding of Exercise Prescription Yes       Intervention Provide education, explanation, and written materials on patient's individual exercise prescription        Expected Outcomes Short Term: Able to explain program exercise prescription;Long Term: Able to explain home exercise prescription to exercise independently              Exercise Goals Re-Evaluation :  Exercise Goals Re-Evaluation    Row Name 03/24/20 0854 04/07/20 1502 04/14/20 0819 04/16/20 0839 04/21/20 1448     Exercise Goal Re-Evaluation   Exercise Goals Review Able to understand and use rate of perceived exertion (RPE) scale;Knowledge and understanding of Target Heart Rate Range (THRR);Understanding of Exercise Prescription Able to understand and use rate of perceived exertion (RPE) scale;Knowledge and understanding of Target Heart Rate Range (THRR);Understanding of Exercise Prescription Increase Physical Activity;Increase Strength and Stamina Increase Physical Activity;Increase Strength and Stamina;Able to understand and use rate of perceived exertion (RPE) scale;Able to understand and use Dyspnea scale;Knowledge and understanding of Target Heart Rate Range (THRR);Able to check pulse independently;Understanding of Exercise Prescription Increase Physical Activity;Increase Strength and Stamina;Understanding of Exercise Prescription   Comments Reviewed RPE and dyspnea scales, THR and program prescription with pt today.  Pt voiced understanding and was given a copy of goals to take home. Mikki Santee has had a coupple episodes of high BP during exercise.  BP came down with rest.  He has also had some angina.  Staff will monitor his progress. Kiefer is not exercising much at home since he is having alot of chest pain and knee pain. He is going to see his cardiologist today to get some answers about his chest pain. Reviewed home exercise with pt today.  Pt plans to walk at home for exercise.  Reviewed THR, pulse, RPE, sign and symptoms, pulse oximetery and when to call 911 or MD.  Also discussed weather considerations and indoor options.  Pt voiced understanding. Leng has been doing well in rehab.  He  still has days where his angina will flare up and he has a hypertiensive response.  But overall, he is doing well.  We will continue to montior his progress.   Expected Outcomes Short: Use RPE daily to regulate intensity. Long: Follow program prescription in THR. Short: be able to exercise without angina/ high BP Long: improve stamina Short: see cardiologist and ask what he is able to do at home for exercise. Long: maintain an exercise routine at home independently Short: Continue to talk wiht cardiologist about chest pain  Long: Continue to exercise independently. Short: Continue to monitor chest pain and BP closely Long: Continue to improve stamina.   [place] Name 05/05/20 1339 05/20/20 1209 05/26/20 0750  Exercise Goal Re-Evaluation   Exercise Goals Review Increase Physical Activity;Increase Strength and Stamina;Understanding of Exercise Prescription Increase Physical Activity;Increase Strength and Stamina;Understanding of Exercise Prescription Increase Physical Activity;Increase Strength and Stamina;Understanding of Exercise Prescription     Comments Barry is progressing well and ha soncreased levels on XR and is up to 8 lb for strength work.  He is considering whether or not he wants to go back to work full time.  Staff will monitor progress. Jafar has been doing well. BP still remains hypertensive at times during exercise but will come back down on rechecks. He has been working within his THR. Will continue to monitor. Kree does stretching in the morning and is not comfortable walking at home as he still gets chest pain at times. He stays active but does not complete continuous cardiovascular exercise. He is due to see a pulmonologist later this month and is also being referred to EP for a referral for further evaluation. Jaion is aware how to take his nitro as directed.     Expected Outcomes Short: continue to attend consistently Long: improve overall stamina Short: Continue to attend rehab  regularly Long: Continue to progress MET level and workloads Short: Complete visits with pulmonogist and EP Long: Be able to exercise independtly using appropriate RPE and THR            Discharge Exercise Prescription (Final Exercise Prescription Changes):  Exercise Prescription Changes - 05/20/20 1200      Response to Exercise   Blood Pressure (Admit) 120/56    Blood Pressure (Exercise) 204/64    Blood Pressure (Exit) 112/64    Heart Rate (Admit) 71 bpm    Heart Rate (Exercise) 114 bpm    Heart Rate (Exit) 77 bpm    Rating of Perceived Exertion (Exercise) 15    Symptoms angina 2/10    Duration Continue with 30 min of aerobic exercise without signs/symptoms of physical distress.    Intensity THRR unchanged      Progression   Progression Continue to progress workloads to maintain intensity without signs/symptoms of physical distress.    Average METs 3.53      Resistance Training   Training Prescription Yes    Weight 8 lb    Reps 10-15      Interval Training   Interval Training No      Treadmill   MPH 1.9    Grade 0    Minutes 15    METs 2.45      NuStep   Level 4    Minutes 15    METs 3.3      REL-XR   Level 4    Minutes 15    METs 3.3      Biostep-RELP   Level 4    Minutes 15    METs 4      Home Exercise Plan   Plans to continue exercise at Home (comment)   walk   Frequency Add 2 additional days to program exercise sessions.    Initial Home Exercises Provided 04/16/20           Nutrition:  Target Goals: Understanding of nutrition guidelines, daily intake of sodium '1500mg'$ , cholesterol '200mg'$ , calories 30% from fat and 7% or less from saturated fats, daily to have 5 or more servings of fruits and vegetables.  Education: Controlling Sodium/Reading Food Labels -Group verbal and written material supporting the discussion of sodium use in heart healthy nutrition. Review and explanation with models, verbal and written materials  for utilization of the food  label.   Education: General Nutrition Guidelines/Fats and Fiber: -Group instruction provided by verbal, written material, models and posters to present the general guidelines for heart healthy nutrition. Gives an explanation and review of dietary fats and fiber.   Biometrics:  Pre Biometrics - 03/19/20 1038      Pre Biometrics   Height 5' 7.75" (1.721 m)    Weight 257 lb 1.6 oz (116.6 kg)    BMI (Calculated) 39.37    Single Leg Stand 30 seconds            Nutrition Therapy Plan and Nutrition Goals:  Nutrition Therapy & Goals - 03/31/20 0821      Nutrition Therapy   Diet Low Na, heart healthy, diabetes    Drug/Food Interactions Statins/Certain Fruits    Protein (specify units) 95g    Fiber 30 grams    Whole Grain Foods 3 servings    Saturated Fats 12 max. grams    Fruits and Vegetables 5 servings/day    Sodium 1.5 grams      Personal Nutrition Goals   Nutrition Goal ST: Avoid "naked" CHOs, choose more complex CHOs LT: control BG (120 usually in AM)   Sleep apnea better now wearing CPAP   Comments D: baked cod with peas and carrots S: 2 small nectarines, 7 prunes (8-8:30). Wife has been very supportive with health. Discussed heart healthy and diabetes friendly.      Intervention Plan   Intervention Prescribe, educate and counsel regarding individualized specific dietary modifications aiming towards targeted core components such as weight, hypertension, lipid management, diabetes, heart failure and other comorbidities.;Nutrition handout(s) given to patient.    Expected Outcomes Short Term Goal: Understand basic principles of dietary content, such as calories, fat, sodium, cholesterol and nutrients.;Short Term Goal: A plan has been developed with personal nutrition goals set during dietitian appointment.;Long Term Goal: Adherence to prescribed nutrition plan.           Nutrition Assessments:  Nutrition Assessments - 03/19/20 1040      MEDFICTS Scores   Pre Score 23            MEDIFICTS Score Key:          ?70 Need to make dietary changes          40-70 Heart Healthy Diet         ? 40 Therapeutic Level Cholesterol Diet  Nutrition Goals Re-Evaluation:  Nutrition Goals Re-Evaluation    [place] Name 04/14/20 0818 05/26/20 0814           Goals   Current Weight 257 lb (116.6 kg) --      Nutrition Goal Lose weight, maintain diabetes. Maintain BG      Comment Ab is watching more of what he eats and what food turn into sugar for his dabetes. He has an appointment with pulmonologist next month, heart monitor showed many a-fib events and will see a specialist. Pt reports his healthy eating is going well - had some parties come up. Checks BG 4x/week - usually after something that is not as diabetes friendly 120.      Expected Outcome Short: eat a heart healthy diet. Long: maintain a diet to help bring down his A1C. Short: eat a heart healthy diet. Long: maintain a diet to help bring down his A1C.             Nutrition Goals Discharge (Final Nutrition Goals Re-Evaluation):  Nutrition Goals Re-Evaluation - 05/26/20  0814      Goals   Nutrition Goal Maintain BG    Comment He has an appointment with pulmonologist next month, heart monitor showed many a-fib events and will see a specialist. Pt reports his healthy eating is going well - had some parties come up. Checks BG 4x/week - usually after something that is not as diabetes friendly 120.    Expected Outcome Short: eat a heart healthy diet. Long: maintain a diet to help bring down his A1C.           Psychosocial: Target Goals: Acknowledge presence or absence of significant depression and/or stress, maximize coping skills, provide positive support system. Participant is able to verbalize types and ability to use techniques and skills needed for reducing stress and depression.   Education: Depression - Provides group verbal and written instruction on the correlation between heart/lung disease and  depressed mood, treatment options, and the stigmas associated with seeking treatment.   Education: Sleep Hygiene -Provides group verbal and written instruction about how sleep can affect your health.  Define sleep hygiene, discuss sleep cycles and impact of sleep habits. Review good sleep hygiene tips.     Education: Stress and Anxiety: - Provides group verbal and written instruction about the health risks of elevated stress and causes of high stress.  Discuss the correlation between heart/lung disease and anxiety and treatment options. Review healthy ways to manage with stress and anxiety.    Initial Review & Psychosocial Screening:  Initial Psych Review & Screening - 03/18/20 1116      Initial Review   Current issues with Current Stress Concerns;Current Psychotropic Meds    Source of Stress Concerns Chronic Illness    Comments Stress of the illness and has increased Lexapro recently.      Family Dynamics   Good Support System? Yes   Wife, family     Barriers   Psychosocial barriers to participate in program There are no identifiable barriers or psychosocial needs.;The patient should benefit from training in stress management and relaxation.      Screening Interventions   Interventions Encouraged to exercise;Provide feedback about the scores to participant;To provide support and resources with identified psychosocial needs    Expected Outcomes Short Term goal: Utilizing psychosocial counselor, staff and physician to assist with identification of specific Stressors or current issues interfering with healing process. Setting desired goal for each stressor or current issue identified.;Long Term Goal: Stressors or current issues are controlled or eliminated.;Short Term goal: Identification and review with participant of any Quality of Life or Depression concerns found by scoring the questionnaire.;Long Term goal: The participant improves quality of Life and PHQ9 Scores as seen by post  scores and/or verbalization of changes           Quality of Life Scores:   Quality of Life - 03/19/20 1039      Quality of Life   Select Quality of Life      Quality of Life Scores   Health/Function Pre 20.9 %    Socioeconomic Pre 22.87 %    Psych/Spiritual Pre 21.14 %    Family Pre 29.5 %    GLOBAL Pre 22.46 %          Scores of 19 and below usually indicate a poorer quality of life in these areas.  A difference of  2-3 points is a clinically meaningful difference.  A difference of 2-3 points in the total score of the Quality of Life Index has been associated  with significant improvement in overall quality of life, self-image, physical symptoms, and general health in studies assessing change in quality of life.  PHQ-9: Recent Review Flowsheet Data    Depression screen [facility] 2/9 03/19/2020 05/14/2019   Decreased Interest 1 1   Down, Depressed, Hopeless 1 0   PHQ - 2 Score 2 1   Altered sleeping 0 0   Tired, decreased energy 1 2   Change in appetite 1 0   Feeling bad or failure about yourself  0 0   Trouble concentrating 0 0   Moving slowly or fidgety/restless 0 0   Suicidal thoughts 0 0   PHQ-9 Score 4 3   Difficult doing work/chores Not difficult at all Not difficult at all     Interpretation of Total Score  Total Score Depression Severity:  1-4 = Minimal depression, 5-9 = Mild depression, 10-14 = Moderate depression, 15-19 = Moderately severe depression, 20-27 = Severe depression   Psychosocial Evaluation and Intervention:  Psychosocial Evaluation - 03/18/20 1132      Psychosocial Evaluation & Interventions   Interventions Encouraged to exercise with the program and follow exercise prescription;Relaxation education;Stress management education    Comments Roberthas no barriers to entering the program.  He has stress concerns with his chronic symptoms and BP elevations. He recently had his Lexapro dose increased from 10 to 20 mg and stated that it is helping with the  anxiety of his uncontrolled BP. He also stated that his cardiologist recently told him  that they will continue to search for a reason why his BP goes so high without warning. He was encouraged to hear that his doctor has not stopped trying to figure out what is causing his health issues.  Maysin lives in his home with his wife. He has a great support system with his wife and family and church family. He has not retired yet, and is presently  on short term disabilty to soon return to work. Shyheem should do well with the program .    Expected Outcomes STG: Amelia will be able to attend on consistent basis and manage to work on equipment that will not aggravate his knee or back problems.    LTG: Alfonzia will be able to continue the lifestyle changes he learned and started in class.    Continue Psychosocial Services  Follow up required by staff           Psychosocial Re-Evaluation:  Psychosocial Re-Evaluation    Row Name 04/14/20 0813 05/26/20 0806           Psychosocial Re-Evaluation   Current issues with Current Stress Concerns;Current Depression;History of Depression;Current Psychotropic Meds Current Stress Concerns;Current Depression;History of Depression;Current Psychotropic Meds      Comments Donterrius has chest pain frequently and his systolic blood pressure is getting high and he is worried that he is going to have another heart attack. He is wearing a heart monitor currently for a month. He is taking medication to help with his depression and anxiety. Winner is holding up well mentally. Stays compliant with medications. He recently retired and thinks that will be a big stress relief. He is happy to get more spare time to focus on his health. He is due to see a pulmonologist and EP provider for further evaluation.      Expected Outcomes Short: talk with cardiologist to help get answers about his health. Long: continue medications and [facility] to deal with stressors. Short: talk with  pulmonologist/EP about concerns Long:  Utilize exercise to help maintain positive attitude and stress management      Interventions Encouraged to attend Cardiac Rehabilitation for the exercise Encouraged to attend Cardiac Rehabilitation for the exercise      Continue Psychosocial Services  Follow up required by staff Follow up required by staff             Psychosocial Discharge (Final Psychosocial Re-Evaluation):  Psychosocial Re-Evaluation - 05/26/20 0806      Psychosocial Re-Evaluation   Current issues with Current Stress Concerns;Current Depression;History of Depression;Current Psychotropic Meds    Comments Arpan is holding up well mentally. Stays compliant with medications. He recently retired and thinks that will be a big stress relief. He is happy to get more spare time to focus on his health. He is due to see a pulmonologist and EP provider for further evaluation.    Expected Outcomes Short: talk with pulmonologist/EP about concerns Long: Utilize exercise to help maintain positive attitude and stress management    Interventions Encouraged to attend Cardiac Rehabilitation for the exercise    Continue Psychosocial Services  Follow up required by staff           Vocational Rehabilitation: Provide vocational rehab assistance to qualifying candidates.   Vocational Rehab Evaluation & Intervention:  Vocational Rehab - 03/18/20 1119      Initial Vocational Rehab Evaluation & Intervention   Assessment shows need for Vocational Rehabilitation No           Education: Education Goals: Education classes will be provided on a variety of topics geared toward better understanding of heart health and risk factor modification. Participant will state understanding/return demonstration of topics presented as noted by education test scores.  Learning Barriers/Preferences:  Learning Barriers/Preferences - 03/18/20 1119      Learning Barriers/Preferences   Learning Barriers None     Learning Preferences None           General Cardiac Education Topics:  AED/CPR: - Group verbal and written instruction with the use of models to demonstrate the basic use of the AED with the basic ABC's of resuscitation.   Anatomy & Physiology of the Heart: - Group verbal and written instruction and models provide basic cardiac anatomy and physiology, with the coronary electrical and arterial systems. Review of Valvular disease and Heart Failure   Cardiac Procedures: - Group verbal and written instruction to review commonly prescribed medications for heart disease. Reviews the medication, class of the drug, and side effects. Includes the steps to properly store meds and maintain the prescription regimen. (beta blockers and nitrates)   Cardiac Medications I: - Group verbal and written instruction to review commonly prescribed medications for heart disease. Reviews the medication, class of the drug, and side effects. Includes the steps to properly store meds and maintain the prescription regimen.   Cardiac Medications II: -Group verbal and written instruction to review commonly prescribed medications for heart disease. Reviews the medication, class of the drug, and side effects. (all other drug classes)    Go Sex-Intimacy & Heart Disease, Get SMART - Goal Setting: - Group verbal and written instruction through game format to discuss heart disease and the return to sexual intimacy. Provides group verbal and written material to discuss and apply goal setting through the application of the S.M.A.R.T. Method.   Other Matters of the Heart: - Provides group verbal, written materials and models to describe Stable Angina and Peripheral Artery. Includes description of the disease process and treatment options available to the cardiac  patient.   Infection Prevention: - Provides verbal and written material to individual with discussion of infection control including proper hand washing and  proper equipment cleaning during exercise session.   Cardiac Rehab from 05/07/2020 in [facility] Cardiac and Pulmonary Rehab  Date 03/19/20  Educator [name]  Instruction Review Code 1- Verbalizes Understanding      Falls Prevention: - Provides verbal and written material to individual with discussion of falls prevention and safety.   Cardiac Rehab from 05/07/2020 in [facility] Cardiac and Pulmonary Rehab  Date 03/19/20  Educator [facility]  Instruction Review Code 1- Verbalizes Understanding      Other: -Provides group and verbal instruction on various topics (see comments)   Knowledge Questionnaire Score:  Knowledge Questionnaire Score - 03/19/20 1040      Knowledge Questionnaire Score   Pre Score 23/26 Education Focus: Nutrition and Angina           Core Components/Risk Factors/Patient Goals at Admission:  Personal Goals and Risk Factors at Admission - 03/19/20 1040      Core Components/Risk Factors/Patient Goals on Admission    Weight Management Yes;Weight Loss;Obesity    Intervention Weight Management: Develop a combined nutrition and exercise program designed to reach desired caloric intake, while maintaining appropriate intake of nutrient and fiber, sodium and fats, and appropriate energy expenditure required for the weight goal.;Weight Management: Provide education and appropriate resources to help participant work on and attain dietary goals.;Obesity: Provide education and appropriate resources to help participant work on and attain dietary goals.;Weight Management/Obesity: Establish reasonable short term and long term weight goals.    Admit Weight 257 lb 1.6 oz (116.6 kg)    Goal Weight: Short Term 252 lb (114.3 kg)    Goal Weight: Long Term 185 lb (83.9 kg)    Expected Outcomes Short Term: Continue to assess and modify interventions until short term weight is achieved;Long Term: Adherence to nutrition and physical activity/exercise program aimed toward attainment of established weight  goal;Weight Loss: Understanding of general recommendations for a balanced deficit meal plan, which promotes 1-2 lb weight loss per week and includes a negative energy balance of [phone] kcal/d;Understanding recommendations for meals to include 15-35% energy as protein, 25-35% energy from fat, 35-60% energy from carbohydrates, less than '200mg'$  of dietary cholesterol, 20-35 gm of total fiber daily;Understanding of distribution of calorie intake throughout the day with the consumption of 4-5 meals/snacks    Diabetes Yes    Intervention Provide education about signs/symptoms and action to take for hypo/hyperglycemia.;Provide education about proper nutrition, including hydration, and aerobic/resistive exercise prescription along with prescribed medications to achieve blood glucose in normal ranges: Fasting glucose 65-99 mg/dL    Expected Outcomes Short Term: Participant verbalizes understanding of the signs/symptoms and immediate care of hyper/hypoglycemia, proper foot care and importance of medication, aerobic/resistive exercise and nutrition plan for blood glucose control.;Long Term: Attainment of HbA1C < 7%.    Hypertension Yes    Intervention Provide education on lifestyle modifcations including regular physical activity/exercise, weight management, moderate sodium restriction and increased consumption of fresh fruit, vegetables, and low fat dairy, alcohol moderation, and smoking cessation.;Monitor prescription use compliance.    Expected Outcomes Short Term: Continued assessment and intervention until BP is < 140/62m HG in hypertensive participants. < 130/82mHG in hypertensive participants with diabetes, heart failure or chronic kidney disease.;Long Term: Maintenance of blood pressure at goal levels.    Lipids Yes    Intervention Provide education and support for participant on nutrition & aerobic/resistive exercise along with  prescribed medications to achieve LDL '70mg'$ , HDL >'40mg'$ .    Expected Outcomes  Short Term: Participant states understanding of desired cholesterol values and is compliant with medications prescribed. Participant is following exercise prescription and nutrition guidelines.;Long Term: Cholesterol controlled with medications as prescribed, with individualized exercise RX and with personalized nutrition plan. Value goals: LDL < '70mg'$ , HDL > 40 mg.           Education:Diabetes - Individual verbal and written instruction to review signs/symptoms of diabetes, desired ranges of glucose level fasting, after meals and with exercise. Acknowledge that pre and post exercise glucose checks will be done for 3 sessions at entry of program.   Cardiac Rehab from 05/07/2020 in [facility] Cardiac and Pulmonary Rehab  Date 03/19/20  Educator [facility]  Instruction Review Code 1- Verbalizes Understanding      Education: Know Your Numbers and Risk Factors: -Group verbal and written instruction about important numbers in your health.  Discussion of what are risk factors and how they play a role in the disease process.  Review of Cholesterol, Blood Pressure, Diabetes, and BMI and the role they play in your overall health.   Core Components/Risk Factors/Patient Goals Review:   Goals and Risk Factor Review    Row Name 04/14/20 0809 05/26/20 0759           Core Components/Risk Factors/Patient Goals Review   Personal Goals Review Weight Management/Obesity;Diabetes;Lipids;Hypertension Weight Management/Obesity;Diabetes;Lipids;Hypertension      Review Dardan has been having chest pain and has been having bouts with high blood pressure at times. He is meeting with his cardiologist today for a follow up appointment. He has been doing well with his diabetes and is monitoring his sugar at home. His on going chest pain is his main concern at this point in time. Oziah is still having chest pain periodically at home, explains he knows how to take nitro as directed. Takes BP at home, reports mostly stables BP,  sometimes it stays elevated and will recheck and comes back down. Takes blood sugars periodically but not daily, reports stable values. Bart weighs himself at home, stays stable- hasn't focused too much on weight loss, he is retired now and plans to to plan out his meals and not eat out as much      Expected Outcomes Short: meet with cardiologist  Long: maintain chest pain and blood pressure independently Short: Focus on preparing meals at home, eat out less to help with weight loss Long: Continue to manage lifestyle factors             Core Components/Risk Factors/Patient Goals at Discharge (Final Review):   Goals and Risk Factor Review - 05/26/20 0759      Core Components/Risk Factors/Patient Goals Review   Personal Goals Review Weight Management/Obesity;Diabetes;Lipids;Hypertension    Review Raghav is still having chest pain periodically at home, explains he knows how to take nitro as directed. Takes BP at home, reports mostly stables BP, sometimes it stays elevated and will recheck and comes back down. Takes blood sugars periodically but not daily, reports stable values. Nickoli weighs himself at home, stays stable- hasn't focused too much on weight loss, he is retired now and plans to to plan out his meals and not eat out as much    Expected Outcomes Short: Focus on preparing meals at home, eat out less to help with weight loss Long: Continue to manage lifestyle factors           ITP Comments:  ITP Comments  Row Name 03/18/20 1129 03/19/20 0926 03/24/20 0854 04/01/20 8721 04/09/20 0826   ITP Comments Virtual Orientation completed today. Has an appt for EP Eval and gym orientation tomorrow at 8 AM. Documentation of Diagnosis [facility] be found in [facility] 02/17/2020 Completed 6MWT and gym orientation.  Initial ITP created and sent for review to Dr. Emily Filbert, Medical Director. First full day of exercise!  Patient was oriented to gym and equipment including functions, settings, policies, and  procedures.  Patient's individual exercise prescription and treatment plan were reviewed.  All starting workloads were established based on the results of the 6 minute walk test done at initial orientation visit.  The plan for exercise progression was also introduced and progression will be customized based on patient's performance and goals. 30 Day review completed. Medical Director ITP review done, changes made as directed, and signed approval by Medical Director. Seichi is wearing a heart monitor for a little while.   [place] Name 04/29/20 1116 05/27/20 0744         ITP Comments 30 Day review completed. Medical Director ITP review done, changes made as directed, and signed approval by Medical Director. 30 Day review completed. Medical Director ITP review done, changes made as directed, and signed approval by Medical Director.             Comments:

## 2020-06-01 ENCOUNTER — Encounter: Payer: Self-pay | Admitting: *Deleted

## 2020-06-01 ENCOUNTER — Telehealth: Payer: Self-pay | Admitting: *Deleted

## 2020-06-01 DIAGNOSIS — Z955 Presence of coronary angioplasty implant and graft: Secondary | ICD-10-CM

## 2020-06-01 NOTE — Telephone Encounter (Signed)
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  He and wife are not vaccinated, but still considering it.  Did let him know that there are other options as well if not receiving vaccine.  Pt to stay out for week.

## 2020-06-15 ENCOUNTER — Telehealth: Payer: Self-pay | Admitting: *Deleted

## 2020-06-15 ENCOUNTER — Encounter: Payer: Self-pay | Admitting: *Deleted

## 2020-06-15 DIAGNOSIS — Z955 Presence of coronary angioplasty implant and graft: Secondary | ICD-10-CM

## 2020-06-15 NOTE — Progress Notes (Signed)
Discharge Progress Report  Patient Details  Name: Brandon Henderson MRN: [id number] Date of Birth: 08-24-1956 Referring Provider:     Cardiac Rehab from 03/19/2020 in [facility]  Referring Provider Frazier Richards MD       Number of Visits: 15  Reason for Discharge:  Early Exit:  Personal  Smoking History:  Social History   Tobacco Use  Smoking Status Former Smoker  . Types: Cigarettes, E-cigarettes  . Quit date: 10/17/2018  . Years since quitting: 1.6  Smokeless Tobacco Never Used  Tobacco Comment   quit vaping 10/2018. quit smoking 2016    Diagnosis:  Status post coronary artery stent placement  ADL [facility]:   Initial Exercise Prescription:  Initial Exercise Prescription - 03/19/20 1000      Date of Initial Exercise RX and Referring Provider   Date 03/19/20    Referring Provider Frazier Richards MD      Treadmill   MPH 2    Grade 0.5    Minutes 15    METs 2.67      NuStep   Level 1    SPM 80    Minutes 15    METs 2      REL-XR   Level 2    Speed 50    Minutes 15    METs 2      Biostep-RELP   Level 1    SPM 50    Minutes 15    METs 2      Prescription Details   Frequency (times per week) 2    Duration Progress to 30 minutes of continuous aerobic without signs/symptoms of physical distress      Intensity   THRR 40-80% of Max Heartrate 99-136    Ratings of Perceived Exertion 11-13    Perceived Dyspnea 0-4      Progression   Progression Continue to progress workloads to maintain intensity without signs/symptoms of physical distress.      Resistance Training   Training Prescription Yes    Weight 3 lb    Reps 10-15           Discharge Exercise Prescription (Final Exercise Prescription Changes):  Exercise Prescription Changes - 06/02/20 0700      Response to Exercise   Blood Pressure (Admit) 112/64    Blood Pressure (Exercise) 128/60    Blood Pressure (Exit) 94/48    Heart Rate (Admit) 69 bpm    Heart Rate  (Exercise) 99 bpm    Heart Rate (Exit) 67 bpm    Rating of Perceived Exertion (Exercise) 13    Symptoms none    Duration Continue with 30 min of aerobic exercise without signs/symptoms of physical distress.    Intensity THRR unchanged      Progression   Progression Continue to progress workloads to maintain intensity without signs/symptoms of physical distress.    Average METs 2.8      Resistance Training   Training Prescription Yes    Weight 8 lb    Reps 10-15      Interval Training   Interval Training No      NuStep   Level 4    SPM 80    Minutes 15    METs 2.8      Home Exercise Plan   Plans to continue exercise at Home (comment)   walk   Frequency Add 2 additional days to program exercise sessions.    Initial Home Exercises Provided 04/16/20  Functional Capacity:  6 Minute Walk    Row Name 03/19/20 0926         6 Minute Walk   Phase Initial     Distance 668 feet     Walk Time 2.75 minutes     # of Rest Breaks 0  stopped due to chest pain     MPH 2.76     METS 2.06     RPE 17     Perceived Dyspnea  3     VO2 Peak 7.19     Symptoms Yes (comment)     Comments chest pain coming in from car 2/10, chest pain with walk test 5/10 with hypertensive response, lightheaded, headache     Resting HR 62 bpm     Resting BP 144/82     Resting Oxygen Saturation  99 %     Exercise Oxygen Saturation  during 6 min walk 96 %     Max Ex. HR 104 bpm     Max Ex. BP 214/98     2 Minute Post BP 206/94  rck 156/80            Psychological, QOL, Others - Outcomes: PHQ 2/9: Depression screen [facility] 2/9 03/19/2020 05/14/2019  Decreased Interest 1 1  Down, Depressed, Hopeless 1 0  PHQ - 2 Score 2 1  Altered sleeping 0 0  Tired, decreased energy 1 2  Change in appetite 1 0  Feeling bad or failure about yourself  0 0  Trouble concentrating 0 0  Moving slowly or fidgety/restless 0 0  Suicidal thoughts 0 0  PHQ-9 Score 4 3  Difficult doing work/chores Not difficult at  all Not difficult at all    Quality of Life:  Quality of Life - 03/19/20 1039      Quality of Life   Select Quality of Life      Quality of Life Scores   Health/Function Pre 20.9 %    Socioeconomic Pre 22.87 %    Psych/Spiritual Pre 21.14 %    Family Pre 29.5 %    GLOBAL Pre 22.46 %           Personal Goals: Goals established at orientation with interventions provided to work toward goal.  Personal Goals and Risk Factors at Admission - 03/19/20 1040      Core Components/Risk Factors/Patient Goals on Admission    Weight Management Yes;Weight Loss;Obesity    Intervention Weight Management: Develop a combined nutrition and exercise program designed to reach desired caloric intake, while maintaining appropriate intake of nutrient and fiber, sodium and fats, and appropriate energy expenditure required for the weight goal.;Weight Management: Provide education and appropriate resources to help participant work on and attain dietary goals.;Obesity: Provide education and appropriate resources to help participant work on and attain dietary goals.;Weight Management/Obesity: Establish reasonable short term and long term weight goals.    Admit Weight 257 lb 1.6 oz (116.6 kg)    Goal Weight: Short Term 252 lb (114.3 kg)    Goal Weight: Long Term 185 lb (83.9 kg)    Expected Outcomes Short Term: Continue to assess and modify interventions until short term weight is achieved;Long Term: Adherence to nutrition and physical activity/exercise program aimed toward attainment of established weight goal;Weight Loss: Understanding of general recommendations for a balanced deficit meal plan, which promotes 1-2 lb weight loss per week and includes a negative energy balance of [phone] kcal/d;Understanding recommendations for meals to include 15-35% energy as protein, 25-35% energy from  fat, 35-60% energy from carbohydrates, less than 232m of dietary cholesterol, 20-35 gm of total fiber daily;Understanding of  distribution of calorie intake throughout the day with the consumption of 4-5 meals/snacks    Diabetes Yes    Intervention Provide education about signs/symptoms and action to take for hypo/hyperglycemia.;Provide education about proper nutrition, including hydration, and aerobic/resistive exercise prescription along with prescribed medications to achieve blood glucose in normal ranges: Fasting glucose 65-99 mg/dL    Expected Outcomes Short Term: Participant verbalizes understanding of the signs/symptoms and immediate care of hyper/hypoglycemia, proper foot care and importance of medication, aerobic/resistive exercise and nutrition plan for blood glucose control.;Long Term: Attainment of HbA1C < 7%.    Hypertension Yes    Intervention Provide education on lifestyle modifcations including regular physical activity/exercise, weight management, moderate sodium restriction and increased consumption of fresh fruit, vegetables, and low fat dairy, alcohol moderation, and smoking cessation.;Monitor prescription use compliance.    Expected Outcomes Short Term: Continued assessment and intervention until BP is < 140/957mHG in hypertensive participants. < 130/8052mG in hypertensive participants with diabetes, heart failure or chronic kidney disease.;Long Term: Maintenance of blood pressure at goal levels.    Lipids Yes    Intervention Provide education and support for participant on nutrition & aerobic/resistive exercise along with prescribed medications to achieve LDL <78m64mDL >40mg29m Expected Outcomes Short Term: Participant states understanding of desired cholesterol values and is compliant with medications prescribed. Participant is following exercise prescription and nutrition guidelines.;Long Term: Cholesterol controlled with medications as prescribed, with individualized exercise RX and with personalized nutrition plan. Value goals: LDL < 78mg,72m > 40 mg.            Personal Goals Discharge:   Goals and Risk Factor Review    Row Name 04/14/20 0809 05/26/20 0759           Core Components/Risk Factors/Patient Goals Review   Personal Goals Review Weight Management/Obesity;Diabetes;Lipids;Hypertension Weight Management/Obesity;Diabetes;Lipids;Hypertension      Review RobertCyrusseen having chest pain and has been having bouts with high blood pressure at times. He is meeting with his cardiologist today for a follow up appointment. He has been doing well with his diabetes and is monitoring his sugar at home. His on going chest pain is his main concern at this point in time. RobertGershonill having chest pain periodically at home, explains he knows how to take nitro as directed. Takes BP at home, reports mostly stables BP, sometimes it stays elevated and will recheck and comes back down. Takes blood sugars periodically but not daily, reports stable values. RobertAdreans himself at home, stays stable- hasn't focused too much on weight loss, he is retired now and plans to to plan out his meals and not eat out as much      Expected Outcomes Short: meet with cardiologist  Long: maintain chest pain and blood pressure independently Short: Focus on preparing meals at home, eat out less to help with weight loss Long: Continue to manage lifestyle factors             Exercise Goals and Review:  Exercise Goals    Row Name 03/19/20 1036             Exercise Goals   Increase Physical Activity Yes       Intervention Provide advice, education, support and counseling about physical activity/exercise needs.;Develop an individualized exercise prescription for aerobic and resistive training based on initial evaluation findings, risk stratification,  comorbidities and participant's personal goals.       Expected Outcomes Short Term: Attend rehab on a regular basis to increase amount of physical activity.;Long Term: Add in home exercise to make exercise part of routine and to increase amount of physical  activity.;Long Term: Exercising regularly at least 3-5 days a week.       Increase Strength and Stamina Yes       Intervention Provide advice, education, support and counseling about physical activity/exercise needs.;Develop an individualized exercise prescription for aerobic and resistive training based on initial evaluation findings, risk stratification, comorbidities and participant's personal goals.       Expected Outcomes Short Term: Increase workloads from initial exercise prescription for resistance, speed, and METs.;Short Term: Perform resistance training exercises routinely during rehab and add in resistance training at home;Long Term: Improve cardiorespiratory fitness, muscular endurance and strength as measured by increased METs and functional capacity (6MWT)       Able to understand and use rate of perceived exertion (RPE) scale Yes       Intervention Provide education and explanation on how to use RPE scale       Expected Outcomes Short Term: Able to use RPE daily in rehab to express subjective intensity level;Long Term:  Able to use RPE to guide intensity level when exercising independently       Able to understand and use Dyspnea scale Yes       Intervention Provide education and explanation on how to use Dyspnea scale       Expected Outcomes Short Term: Able to use Dyspnea scale daily in rehab to express subjective sense of shortness of breath during exertion;Long Term: Able to use Dyspnea scale to guide intensity level when exercising independently       Knowledge and understanding of Target Heart Rate Range (THRR) Yes       Intervention Provide education and explanation of THRR including how the numbers were predicted and where they are located for reference       Expected Outcomes Short Term: Able to use daily as guideline for intensity in rehab;Long Term: Able to use THRR to govern intensity when exercising independently;Short Term: Able to state/look up THRR       Able to check  pulse independently Yes       Intervention Provide education and demonstration on how to check pulse in carotid and radial arteries.;Review the importance of being able to check your own pulse for safety during independent exercise       Expected Outcomes Short Term: Able to explain why pulse checking is important during independent exercise;Long Term: Able to check pulse independently and accurately       Understanding of Exercise Prescription Yes       Intervention Provide education, explanation, and written materials on patient's individual exercise prescription       Expected Outcomes Short Term: Able to explain program exercise prescription;Long Term: Able to explain home exercise prescription to exercise independently              Exercise Goals Re-Evaluation:  Exercise Goals Re-Evaluation    Row Name 03/24/20 0854 04/07/20 1502 04/14/20 0819 04/16/20 0839 04/21/20 1448     Exercise Goal Re-Evaluation   Exercise Goals Review Able to understand and use rate of perceived exertion (RPE) scale;Knowledge and understanding of Target Heart Rate Range (THRR);Understanding of Exercise Prescription Able to understand and use rate of perceived exertion (RPE) scale;Knowledge and understanding of Target Heart Rate Range (THRR);Understanding of  Exercise Prescription Increase Physical Activity;Increase Strength and Stamina Increase Physical Activity;Increase Strength and Stamina;Able to understand and use rate of perceived exertion (RPE) scale;Able to understand and use Dyspnea scale;Knowledge and understanding of Target Heart Rate Range (THRR);Able to check pulse independently;Understanding of Exercise Prescription Increase Physical Activity;Increase Strength and Stamina;Understanding of Exercise Prescription   Comments Reviewed RPE and dyspnea scales, THR and program prescription with pt today.  Pt voiced understanding and was given a copy of goals to take home. Mikki Santee has had a coupple episodes of high BP  during exercise.  BP came down with rest.  He has also had some angina.  Staff will monitor his progress. Lionell is not exercising much at home since he is having alot of chest pain and knee pain. He is going to see his cardiologist today to get some answers about his chest pain. Reviewed home exercise with pt today.  Pt plans to walk at home for exercise.  Reviewed THR, pulse, RPE, sign and symptoms, pulse oximetery and when to call 911 or MD.  Also discussed weather considerations and indoor options.  Pt voiced understanding. Owain has been doing well in rehab.  He still has days where his angina will flare up and he has a hypertiensive response.  But overall, he is doing well.  We will continue to montior his progress.   Expected Outcomes Short: Use RPE daily to regulate intensity. Long: Follow program prescription in THR. Short: be able to exercise without angina/ high BP Long: improve stamina Short: see cardiologist and ask what he is able to do at home for exercise. Long: maintain an exercise routine at home independently Short: Continue to talk wiht cardiologist about chest pain  Long: Continue to exercise independently. Short: Continue to monitor chest pain and BP closely Long: Continue to improve stamina.   Natoma Name 05/05/20 1339 05/20/20 1209 05/26/20 0750 06/02/20 0733       Exercise Goal Re-Evaluation   Exercise Goals Review Increase Physical Activity;Increase Strength and Stamina;Understanding of Exercise Prescription Increase Physical Activity;Increase Strength and Stamina;Understanding of Exercise Prescription Increase Physical Activity;Increase Strength and Stamina;Understanding of Exercise Prescription Increase Physical Activity;Increase Strength and Stamina;Understanding of Exercise Prescription    Comments Benjermin is progressing well and ha soncreased levels on XR and is up to 8 lb for strength work.  He is considering whether or not he wants to go back to work full time.  Staff will  monitor progress. Lymon has been doing well. BP still remains hypertensive at times during exercise but will come back down on rechecks. He has been working within his THR. Will continue to monitor. Parry does stretching in the morning and is not comfortable walking at home as he still gets chest pain at times. He stays active but does not complete continuous cardiovascular exercise. He is due to see a pulmonologist later this month and is also being referred to EP for a referral for further evaluation. Keithen is aware how to take his nitro as directed. Steve is just getting started back after retiring.  He is mptovated to focus on his health.  Staff will monitor progress.    Expected Outcomes Short: continue to attend consistently Long: improve overall stamina Short: Continue to attend rehab regularly Long: Continue to progress MET level and workloads Short: Complete visits with pulmonogist and EP Long: Be able to exercise independtly using appropriate RPE and THR Short: get back to consistent exercise Long:  improve stamina  Nutrition & Weight - Outcomes:  Pre Biometrics - 03/19/20 1038      Pre Biometrics   Height 5' 7.75" (1.721 m)    Weight 257 lb 1.6 oz (116.6 kg)    BMI (Calculated) 39.37    Single Leg Stand 30 seconds            Nutrition:  Nutrition Therapy & Goals - 03/31/20 0821      Nutrition Therapy   Diet Low Na, heart healthy, diabetes    Drug/Food Interactions Statins/Certain Fruits    Protein (specify units) 95g    Fiber 30 grams    Whole Grain Foods 3 servings    Saturated Fats 12 max. grams    Fruits and Vegetables 5 servings/day    Sodium 1.5 grams      Personal Nutrition Goals   Nutrition Goal ST: Avoid "naked" CHOs, choose more complex CHOs LT: control BG (120 usually in AM)   Sleep apnea better now wearing CPAP   Comments D: baked cod with peas and carrots S: 2 small nectarines, 7 prunes (8-8:30). Wife has been very supportive with health.  Discussed heart healthy and diabetes friendly.      Intervention Plan   Intervention Prescribe, educate and counsel regarding individualized specific dietary modifications aiming towards targeted core components such as weight, hypertension, lipid management, diabetes, heart failure and other comorbidities.;Nutrition handout(s) given to patient.    Expected Outcomes Short Term Goal: Understand basic principles of dietary content, such as calories, fat, sodium, cholesterol and nutrients.;Short Term Goal: A plan has been developed with personal nutrition goals set during dietitian appointment.;Long Term Goal: Adherence to prescribed nutrition plan.           Nutrition Discharge:  Nutrition Assessments - 03/19/20 1040      MEDFICTS Scores   Pre Score 23           Education Questionnaire Score:  Knowledge Questionnaire Score - 03/19/20 1040      Knowledge Questionnaire Score   Pre Score 23/26 Education Focus: Nutrition and Angina           Goals reviewed with patient; copy given to patient.

## 2020-06-15 NOTE — Telephone Encounter (Signed)
Baltasar called to let us know that he is still apprehensive about COVID.  He would like to discharge at this time.

## 2020-06-15 NOTE — Progress Notes (Signed)
Cardiac Individual Treatment Plan  Patient Details  Name: Brandon Henderson MRN: [id number] Date of Birth: 24-Jan-1956 Referring Provider:     Cardiac Rehab from 03/19/2020 in [facility] Cardiac and Pulmonary Rehab  Referring Provider Frazier Richards MD      Initial Encounter Date:    Cardiac Rehab from 03/19/2020 in [facility] Cardiac and Pulmonary Rehab  Date 03/19/20      Visit Diagnosis: Status post coronary artery stent placement  Patient's Home Medications on Admission:  Current Outpatient Medications:  .  Ascorbic Acid (VITAMIN C) 1000 MG tablet, Take 1,000 mg by mouth at bedtime. , Disp: , Rfl:  .  aspirin EC 81 MG tablet, Take 81 mg by mouth at bedtime. , Disp: , Rfl:  .  atorvastatin (LIPITOR) 40 MG tablet, Take 1 tablet (40 mg total) by mouth daily. Please call to schedule an appointment for further refills., Disp: 90 tablet, Rfl: 0 .  clopidogrel (PLAVIX) 75 MG tablet, Take 1 tablet (75 mg total) by mouth daily with breakfast. (Patient taking differently: Take 75 mg by mouth at bedtime. ), Disp: 90 tablet, Rfl: 3 .  Coenzyme Q10 (COQ10) 200 MG CAPS, Take 200 mg by mouth at bedtime., Disp: , Rfl:  .  diphenhydrAMINE (BENADRYL) 50 MG tablet, Take 1 tablet (50 mg) by mouth prior to leaving home to your procedure with a small sip of water., Disp: 1 tablet, Rfl: 0 .  escitalopram (LEXAPRO) 10 MG tablet, Take 10 mg by mouth at bedtime. , Disp: , Rfl:  .  ezetimibe (ZETIA) 10 MG tablet, Take 10 mg by mouth at bedtime. , Disp: , Rfl:  .  furosemide (LASIX) 40 MG tablet, Take 1 tablet (40 mg total) by mouth daily., Disp: 30 tablet, Rfl: 3 .  Glucosamine-Chondroitin (COSAMIN DS PO), Take 2 tablets by mouth at bedtime. , Disp: , Rfl:  .  isosorbide mononitrate (IMDUR) 30 MG 24 hr tablet, Take 1 tablet (30 mg total) by mouth daily. (Patient taking differently: Take 30 mg by mouth at bedtime. ), Disp: 90 tablet, Rfl: 3 .  magnesium oxide (MAG-OX) 400 MG tablet, Take 400 mg by mouth at bedtime.,  Disp: , Rfl:  .  metoprolol tartrate (LOPRESSOR) 25 MG tablet, Take 0.5 tablets (12.5 mg total) by mouth 2 (two) times daily., Disp: 90 tablet, Rfl: 3 .  Multiple Vitamin (MULTIVITAMIN WITH MINERALS) TABS tablet, Take 1 tablet by mouth at bedtime., Disp: , Rfl:  .  nitroGLYCERIN (NITROSTAT) 0.4 MG SL tablet, Place 1 tablet (0.4 mg total) under the tongue every 5 (five) minutes as needed for chest pain., Disp: 90 tablet, Rfl: 3 .  pantoprazole (PROTONIX) 40 MG tablet, Take 40 mg by mouth at bedtime. , Disp: , Rfl:  .  Psyllium (METAMUCIL PO), Take 1 Dose by mouth at bedtime., Disp: , Rfl:  .  ramipril (ALTACE) 5 MG capsule, Take 1 capsule (5 mg total) by mouth daily. Please call to schedule appointment for further refills., Disp: 90 capsule, Rfl: 0 .  ranolazine (RANEXA) 500 MG 12 hr tablet, Take 1 tablet (500 mg total) by mouth 2 (two) times daily., Disp: 60 tablet, Rfl: 11  Past Medical History: Past Medical History:  Diagnosis Date  . Anginal pain ([place])   . Arthritis   . Barrett esophagus   . Carotid arterial disease ([place])    a. 2011 s/p R CEA.  . Coronary artery disease    a. 2000 s/p PCI/BMS x 2 of OM2 [name], [place]); b. 12/2018  MV: EF 43% (likely 2/2 GI uptake artifact No ischemia. Low risk; c. 03/2019 PCI: LM nl, LAD 56m LCX nl, OM2 90 ISR, RCA 40p, 830m3.5x15 Resolute Onyx DES), RPL2 70. d. 06/04/2019 LHC 1. severe OM2 (suspected in stent restenosis), mild to mod dz LAD & RCA, patent mRCA stent, nl LV pressure, balloon angio to OM2 90p to 40p  . Depression   . Diabetes mellitus without complication ([place]  . Diastasis recti   . GERD (gastroesophageal reflux disease)   . History of hiatal hernia   . History of kidney stones   . History of tobacco abuse   . Hyperlipidemia LDL goal <70   . Hypertension   . Morbid obesity ([place]  . Osteoarthritis   . Sleep apnea     Tobacco Use: Social History   Tobacco Use  Smoking Status Former Smoker  . Types: Cigarettes, E-cigarettes  .  Quit date: 10/17/2018  . Years since quitting: 1.6  Smokeless Tobacco Never Used  Tobacco Comment   quit vaping 10/2018. quit smoking 2016    Labs: Recent Review Flowsheet Data    Labs for ITP Cardiac and Pulmonary Rehab Latest Ref Rng & Units 01/01/2019   Cholestrol 100 - 199 mg/dL 151   LDLCALC 0 - 99 mg/dL 76   HDL >39 mg/dL 44   Trlycerides 0 - 149 mg/dL 154(H)   Hemoglobin A1c 4.8 - 5.6 % 6.2(H)       Exercise Target Goals: Exercise Program Goal: Individual exercise prescription set using results from initial 6 min walk test and THRR while considering  patient's activity barriers and safety.   Exercise Prescription Goal: Initial exercise prescription builds to 30-45 minutes a day of aerobic activity, 2-3 days per week.  Home exercise guidelines will be given to patient during program as part of exercise prescription that the participant will acknowledge.   Education: Aerobic Exercise & Resistance Training: - Gives group verbal and written instruction on the various components of exercise. Focuses on aerobic and resistive training programs and the benefits of this training and how to safely progress through these programs..   Education: Exercise & Equipment Safety: - Individual verbal instruction and demonstration of equipment use and safety with use of the equipment.   [facility]  Date 03/19/20  [facility] Review Code 1- Verbalizes Understanding      Education: Exercise Physiology & General Exercise Guidelines: - Group verbal and written instruction with models to review the exercise physiology of the cardiovascular system and associated critical values. Provides general exercise guidelines with specific guidelines to those with heart or lung disease.    Education: Flexibility, Balance, Mind/Body Relaxation: Provides group verbal/written instruction on the benefits of flexibility and balance training,  including mind/body exercise modes such as yoga, pilates and tai chi.  Demonstration and skill practice provided.   Activity Barriers & Risk Stratification:  Activity Barriers & Cardiac Risk Stratification - 03/19/20 0929      Activity Barriers & Cardiac Risk Stratification   Activity Barriers Joint Problems;Other (comment);Back Problems;Chest Pain/Angina;Shortness of Breath;Balance Concerns;Deconditioning;Muscular Weakness    Comments Right knee  needs replacement, back with spinal stenosis    Cardiac Risk Stratification High           6 Minute Walk:  6 Minute Walk    Row Name 03/19/20 0926         6 Minute Walk   Phase Initial  Distance 668 feet     Walk Time 2.75 minutes     # of Rest Breaks 0  stopped due to chest pain     MPH 2.76     METS 2.06     RPE 17     Perceived Dyspnea  3     VO2 Peak 7.19     Symptoms Yes (comment)     Comments chest pain coming in from car 2/10, chest pain with walk test 5/10 with hypertensive response, lightheaded, headache     Resting HR 62 bpm     Resting BP 144/82     Resting Oxygen Saturation  99 %     Exercise Oxygen Saturation  during 6 min walk 96 %     Max Ex. HR 104 bpm     Max Ex. BP 214/98     2 Minute Post BP 206/94  rck 156/80            Oxygen Initial Assessment:   Oxygen Re-Evaluation:   Oxygen Discharge (Final Oxygen Re-Evaluation):   Initial Exercise Prescription:  Initial Exercise Prescription - 03/19/20 1000      Date of Initial Exercise RX and Referring Provider   Date 03/19/20    Referring Provider Frazier Richards MD      Treadmill   MPH 2    Grade 0.5    Minutes 15    METs 2.67      NuStep   Level 1    SPM 80    Minutes 15    METs 2      REL-XR   Level 2    Speed 50    Minutes 15    METs 2      Biostep-RELP   Level 1    SPM 50    Minutes 15    METs 2      Prescription Details   Frequency (times per week) 2    Duration Progress to 30 minutes of continuous aerobic without  signs/symptoms of physical distress      Intensity   THRR 40-80% of Max Heartrate 99-136    Ratings of Perceived Exertion 11-13    Perceived Dyspnea 0-4      Progression   Progression Continue to progress workloads to maintain intensity without signs/symptoms of physical distress.      Resistance Training   Training Prescription Yes    Weight 3 lb    Reps 10-15           Perform Capillary Blood Glucose checks as needed.  Exercise Prescription Changes:  Exercise Prescription Changes    Row Name 03/19/20 1000 04/07/20 1400 04/16/20 0800 04/21/20 1400 05/05/20 1300     Response to Exercise   Blood Pressure (Admit) 144/82 110/62 -- 128/70 144/64   Blood Pressure (Exercise) 214/98  202/94, 156/80 222/80  146/62 -- 188/68 184/76   Blood Pressure (Exit) 136/80 100/60 -- 96/62 112/62   Heart Rate (Admit) 62 bpm 76 bpm -- 67 bpm 89 bpm   Heart Rate (Exercise) 104 bpm 98 bpm -- 109 bpm 103 bpm   Heart Rate (Exit) 68 bpm 66 bpm -- 76 bpm 71 bpm   Oxygen Saturation (Admit) 99 % -- -- -- --   Oxygen Saturation (Exercise) 98 % -- -- -- --   Rating of Perceived Exertion (Exercise) 17 -- -- 12 15   Perceived Dyspnea (Exercise) 3 -- -- -- --   Symptoms chest pain (2/10 on arrival) (5/10 walk  test), lightheaded, headache angina 6/10  decreased to 2/10 -- angina 4/10 --   Comments walk test results -- -- -- --   Duration -- -- -- Continue with 30 min of aerobic exercise without signs/symptoms of physical distress. Continue with 30 min of aerobic exercise without signs/symptoms of physical distress.   Intensity -- -- -- THRR unchanged THRR unchanged     Progression   Progression -- Continue to progress workloads to maintain intensity without signs/symptoms of physical distress. -- Continue to progress workloads to maintain intensity without signs/symptoms of physical distress. Continue to progress workloads to maintain intensity without signs/symptoms of physical distress.   Average METs --  2.67 -- 3.08 2.9     Resistance Training   Training Prescription -- Yes -- Yes Yes   Weight -- 3 lb -- 5 lb 8 lb   Reps -- 10-15 -- 10-15 10-15     Interval Training   Interval Training -- No -- No No     Treadmill   MPH -- 2 -- 2 1.9   Grade -- 0.5 -- 0.5 0   Minutes -- 15 -- 15 15   METs -- 2.67 -- 2.67 --     REL-XR   Level -- 1 -- 1 4   Speed -- 50 -- -- 50   Minutes -- 15 -- 15 15   METs -- -- -- 3.5 3.3     Home Exercise Plan   Plans to continue exercise at -- -- Home (comment)  walk Home (comment)  walk Home (comment)  walk   Frequency -- -- Add 2 additional days to program exercise sessions. Add 2 additional days to program exercise sessions. Add 2 additional days to program exercise sessions.   Initial Home Exercises Provided -- -- 04/16/20 04/16/20 04/16/20   Row Name 05/20/20 1200 06/02/20 0700           Response to Exercise   Blood Pressure (Admit) 120/56 112/64      Blood Pressure (Exercise) 204/64 128/60      Blood Pressure (Exit) 112/64 94/48      Heart Rate (Admit) 71 bpm 69 bpm      Heart Rate (Exercise) 114 bpm 99 bpm      Heart Rate (Exit) 77 bpm 67 bpm      Rating of Perceived Exertion (Exercise) 15 13      Symptoms angina 2/10 none      Duration Continue with 30 min of aerobic exercise without signs/symptoms of physical distress. Continue with 30 min of aerobic exercise without signs/symptoms of physical distress.      Intensity THRR unchanged THRR unchanged        Progression   Progression Continue to progress workloads to maintain intensity without signs/symptoms of physical distress. Continue to progress workloads to maintain intensity without signs/symptoms of physical distress.      Average METs 3.53 2.8        Resistance Training   Training Prescription Yes Yes      Weight 8 lb 8 lb      Reps 10-15 10-15        Interval Training   Interval Training No No        Treadmill   MPH 1.9 --      Grade 0 --      Minutes 15 --      METs  2.45 --        NuStep   Level 4 4  SPM -- 80      Minutes 15 15      METs 3.3 2.8        REL-XR   Level 4 --      Minutes 15 --      METs 3.3 --        Biostep-RELP   Level 4 --      Minutes 15 --      METs 4 --        Home Exercise Plan   Plans to continue exercise at Home (comment)  walk Home (comment)  walk      Frequency Add 2 additional days to program exercise sessions. Add 2 additional days to program exercise sessions.      Initial Home Exercises Provided 04/16/20 04/16/20             Exercise Comments:  Exercise Comments    Row Name 03/24/20 0854           Exercise Comments First full day of exercise!  Patient was oriented to gym and equipment including functions, settings, policies, and procedures.  Patient's individual exercise prescription and treatment plan were reviewed.  All starting workloads were established based on the results of the 6 minute walk test done at initial orientation visit.  The plan for exercise progression was also introduced and progression will be customized based on patient's performance and goals.              Exercise Goals and Review:  Exercise Goals    Row Name 03/19/20 1036             Exercise Goals   Increase Physical Activity Yes       Intervention Provide advice, education, support and counseling about physical activity/exercise needs.;Develop an individualized exercise prescription for aerobic and resistive training based on initial evaluation findings, risk stratification, comorbidities and participant's personal goals.       Expected Outcomes Short Term: Attend rehab on a regular basis to increase amount of physical activity.;Long Term: Add in home exercise to make exercise part of routine and to increase amount of physical activity.;Long Term: Exercising regularly at least 3-5 days a week.       Increase Strength and Stamina Yes       Intervention Provide advice, education, support and counseling about physical  activity/exercise needs.;Develop an individualized exercise prescription for aerobic and resistive training based on initial evaluation findings, risk stratification, comorbidities and participant's personal goals.       Expected Outcomes Short Term: Increase workloads from initial exercise prescription for resistance, speed, and METs.;Short Term: Perform resistance training exercises routinely during rehab and add in resistance training at home;Long Term: Improve cardiorespiratory fitness, muscular endurance and strength as measured by increased METs and functional capacity ( )       Able to understand and use rate of perceived exertion (RPE) scale Yes       Intervention Provide education and explanation on how to use RPE scale       Expected Outcomes Short Term: Able to use RPE daily in rehab to express subjective intensity level;Long Term:  Able to use RPE to guide intensity level when exercising independently       Able to understand and use Dyspnea scale Yes       Intervention Provide education and explanation on how to use Dyspnea scale       Expected Outcomes Short Term: Able to use Dyspnea scale daily in rehab to express subjective  sense of shortness of breath during exertion;Long Term: Able to use Dyspnea scale to guide intensity level when exercising independently       Knowledge and understanding of Target Heart Rate Range (THRR) Yes       Intervention Provide education and explanation of THRR including how the numbers were predicted and where they are located for reference       Expected Outcomes Short Term: Able to use daily as guideline for intensity in rehab;Long Term: Able to use THRR to govern intensity when exercising independently;Short Term: Able to state/look up THRR       Able to check pulse independently Yes       Intervention Provide education and demonstration on how to check pulse in carotid and radial arteries.;Review the importance of being able to check your own pulse for  safety during independent exercise       Expected Outcomes Short Term: Able to explain why pulse checking is important during independent exercise;Long Term: Able to check pulse independently and accurately       Understanding of Exercise Prescription Yes       Intervention Provide education, explanation, and written materials on patient's individual exercise prescription       Expected Outcomes Short Term: Able to explain program exercise prescription;Long Term: Able to explain home exercise prescription to exercise independently              Exercise Goals Re-Evaluation :  Exercise Goals Re-Evaluation    Row Name 03/24/20 0854 04/07/20 1502 04/14/20 0819 04/16/20 0839 04/21/20 1448     Exercise Goal Re-Evaluation   Exercise Goals Review Able to understand and use rate of perceived exertion (RPE) scale;Knowledge and understanding of Target Heart Rate Range (THRR);Understanding of Exercise Prescription Able to understand and use rate of perceived exertion (RPE) scale;Knowledge and understanding of Target Heart Rate Range (THRR);Understanding of Exercise Prescription Increase Physical Activity;Increase Strength and Stamina Increase Physical Activity;Increase Strength and Stamina;Able to understand and use rate of perceived exertion (RPE) scale;Able to understand and use Dyspnea scale;Knowledge and understanding of Target Heart Rate Range (THRR);Able to check pulse independently;Understanding of Exercise Prescription Increase Physical Activity;Increase Strength and Stamina;Understanding of Exercise Prescription   Comments Reviewed RPE and dyspnea scales, THR and program prescription with pt today.  Pt voiced understanding and was given a copy of goals to take home. Mikki Santee has had a coupple episodes of high BP during exercise.  BP came down with rest.  He has also had some angina.  Staff will monitor his progress. Matt is not exercising much at home since he is having alot of chest pain and knee pain. He  is going to see his cardiologist today to get some answers about his chest pain. Reviewed home exercise with pt today.  Pt plans to walk at home for exercise.  Reviewed THR, pulse, RPE, sign and symptoms, pulse oximetery and when to call 911 or MD.  Also discussed weather considerations and indoor options.  Pt voiced understanding. Markez has been doing well in rehab.  He still has days where his angina will flare up and he has a hypertiensive response.  But overall, he is doing well.  We will continue to montior his progress.   Expected Outcomes Short: Use RPE daily to regulate intensity. Long: Follow program prescription in THR. Short: be able to exercise without angina/ high BP Long: improve stamina Short: see cardiologist and ask what he is able to do at home for exercise. Long: maintain an  exercise routine at home independently Short: Continue to talk wiht cardiologist about chest pain  Long: Continue to exercise independently. Short: Continue to monitor chest pain and BP closely Long: Continue to improve stamina.   [place] Name 05/05/20 1339 05/20/20 1209 05/26/20 0750 06/02/20 0733       Exercise Goal Re-Evaluation   Exercise Goals Review Increase Physical Activity;Increase Strength and Stamina;Understanding of Exercise Prescription Increase Physical Activity;Increase Strength and Stamina;Understanding of Exercise Prescription Increase Physical Activity;Increase Strength and Stamina;Understanding of Exercise Prescription Increase Physical Activity;Increase Strength and Stamina;Understanding of Exercise Prescription    Comments Devion is progressing well and ha soncreased levels on XR and is up to 8 lb for strength work.  He is considering whether or not he wants to go back to work full time.  Staff will monitor progress. Alfonso has been doing well. BP still remains hypertensive at times during exercise but will come back down on rechecks. He has been working within his THR. Will continue to monitor. [place]  does stretching in the morning and is not comfortable walking at home as he still gets chest pain at times. He stays active but does not complete continuous cardiovascular exercise. He is due to see a pulmonologist later this month and is also being referred to EP for a referral for further evaluation. Matyas is aware how to take his nitro as directed. Kennth is just getting started back after retiring.  He is mptovated to focus on his health.  Staff will monitor progress.    Expected Outcomes Short: continue to attend consistently Long: improve overall stamina Short: Continue to attend rehab regularly Long: Continue to progress MET level and workloads Short: Complete visits with pulmonogist and EP Long: Be able to exercise independtly using appropriate RPE and THR Short: get back to consistent exercise Long:  improve stamina           Discharge Exercise Prescription (Final Exercise Prescription Changes):  Exercise Prescription Changes - 06/02/20 0700      Response to Exercise   Blood Pressure (Admit) 112/64    Blood Pressure (Exercise) 128/60    Blood Pressure (Exit) 94/48    Heart Rate (Admit) 69 bpm    Heart Rate (Exercise) 99 bpm    Heart Rate (Exit) 67 bpm    Rating of Perceived Exertion (Exercise) 13    Symptoms none    Duration Continue with 30 min of aerobic exercise without signs/symptoms of physical distress.    Intensity THRR unchanged      Progression   Progression Continue to progress workloads to maintain intensity without signs/symptoms of physical distress.    Average METs 2.8      Resistance Training   Training Prescription Yes    Weight 8 lb    Reps 10-15      Interval Training   Interval Training No      NuStep   Level 4    SPM 80    Minutes 15    METs 2.8      Home Exercise Plan   Plans to continue exercise at Home (comment)   walk   Frequency Add 2 additional days to program exercise sessions.    Initial Home Exercises Provided 04/16/20            Nutrition:  Target Goals: Understanding of nutrition guidelines, daily intake of sodium '1500mg'$ , cholesterol '200mg'$ , calories 30% from fat and 7% or less from saturated fats, daily to have 5 or more servings of fruits and vegetables.  Education: Controlling Sodium/Reading Food Labels -Group verbal and written material supporting the discussion of sodium use in heart healthy nutrition. Review and explanation with models, verbal and written materials for utilization of the food label.   Education: General Nutrition Guidelines/Fats and Fiber: -Group instruction provided by verbal, written material, models and posters to present the general guidelines for heart healthy nutrition. Gives an explanation and review of dietary fats and fiber.   Biometrics:  Pre Biometrics - 03/19/20 1038      Pre Biometrics   Height 5' 7.75" (1.721 m)    Weight 257 lb 1.6 oz (116.6 kg)    BMI (Calculated) 39.37    Single Leg Stand 30 seconds            Nutrition Therapy Plan and Nutrition Goals:  Nutrition Therapy & Goals - 03/31/20 0821      Nutrition Therapy   Diet Low Na, heart healthy, diabetes    Drug/Food Interactions Statins/Certain Fruits    Protein (specify units) 95g    Fiber 30 grams    Whole Grain Foods 3 servings    Saturated Fats 12 max. grams    Fruits and Vegetables 5 servings/day    Sodium 1.5 grams      Personal Nutrition Goals   Nutrition Goal ST: Avoid "naked" CHOs, choose more complex CHOs LT: control BG (120 usually in AM)   Sleep apnea better now wearing CPAP   Comments D: baked cod with peas and carrots S: 2 small nectarines, 7 prunes (8-8:30). Wife has been very supportive with health. Discussed heart healthy and diabetes friendly.      Intervention Plan   Intervention Prescribe, educate and counsel regarding individualized specific dietary modifications aiming towards targeted core components such as weight, hypertension, lipid management, diabetes, heart failure and  other comorbidities.;Nutrition handout(s) given to patient.    Expected Outcomes Short Term Goal: Understand basic principles of dietary content, such as calories, fat, sodium, cholesterol and nutrients.;Short Term Goal: A plan has been developed with personal nutrition goals set during dietitian appointment.;Long Term Goal: Adherence to prescribed nutrition plan.           Nutrition Assessments:  Nutrition Assessments - 03/19/20 1040      MEDFICTS Scores   Pre Score 23           MEDIFICTS Score Key:          ?70 Need to make dietary changes          40-70 Heart Healthy Diet         ? 40 Therapeutic Level Cholesterol Diet  Nutrition Goals Re-Evaluation:  Nutrition Goals Re-Evaluation    [place] Name 04/14/20 0818 05/26/20 0814           Goals   Current Weight 257 lb (116.6 kg) --      Nutrition Goal Lose weight, maintain diabetes. Maintain BG      Comment Roque is watching more of what he eats and what food turn into sugar for his dabetes. He has an appointment with pulmonologist next month, heart monitor showed many a-fib events and will see a specialist. Pt reports his healthy eating is going well - had some parties come up. Checks BG 4x/week - usually after something that is not as diabetes friendly 120.      Expected Outcome Short: eat a heart healthy diet. Long: maintain a diet to help bring down his A1C. Short: eat a heart healthy diet. Long: maintain a diet to help bring  down his A1C.             Nutrition Goals Discharge (Final Nutrition Goals Re-Evaluation):  Nutrition Goals Re-Evaluation - 05/26/20 0814      Goals   Nutrition Goal Maintain BG    Comment He has an appointment with pulmonologist next month, heart monitor showed many a-fib events and will see a specialist. Pt reports his healthy eating is going well - had some parties come up. Checks BG 4x/week - usually after something that is not as diabetes friendly 120.    Expected Outcome Short: eat a heart  healthy diet. Long: maintain a diet to help bring down his A1C.           Psychosocial: Target Goals: Acknowledge presence or absence of significant depression and/or stress, maximize coping skills, provide positive support system. Participant is able to verbalize types and ability to use techniques and skills needed for reducing stress and depression.   Education: Depression - Provides group verbal and written instruction on the correlation between heart/lung disease and depressed mood, treatment options, and the stigmas associated with seeking treatment.   Education: Sleep Hygiene -Provides group verbal and written instruction about how sleep can affect your health.  Define sleep hygiene, discuss sleep cycles and impact of sleep habits. Review good sleep hygiene tips.     Education: Stress and Anxiety: - Provides group verbal and written instruction about the health risks of elevated stress and causes of high stress.  Discuss the correlation between heart/lung disease and anxiety and treatment options. Review healthy ways to manage with stress and anxiety.    Initial Review & Psychosocial Screening:  Initial Psych Review & Screening - 03/18/20 1116      Initial Review   Current issues with Current Stress Concerns;Current Psychotropic Meds    Source of Stress Concerns Chronic Illness    Comments Stress of the illness and has increased Lexapro recently.      Family Dynamics   Good Support System? Yes   Wife, family     Barriers   Psychosocial barriers to participate in program There are no identifiable barriers or psychosocial needs.;The patient should benefit from training in stress management and relaxation.      Screening Interventions   Interventions Encouraged to exercise;Provide feedback about the scores to participant;To provide support and resources with identified psychosocial needs    Expected Outcomes Short Term goal: Utilizing psychosocial counselor, staff and  physician to assist with identification of specific Stressors or current issues interfering with healing process. Setting desired goal for each stressor or current issue identified.;Long Term Goal: Stressors or current issues are controlled or eliminated.;Short Term goal: Identification and review with participant of any Quality of Life or Depression concerns found by scoring the questionnaire.;Long Term goal: The participant improves quality of Life and PHQ9 Scores as seen by post scores and/or verbalization of changes           Quality of Life Scores:   Quality of Life - 03/19/20 1039      Quality of Life   Select Quality of Life      Quality of Life Scores   Health/Function Pre 20.9 %    Socioeconomic Pre 22.87 %    Psych/Spiritual Pre 21.14 %    Family Pre 29.5 %    GLOBAL Pre 22.46 %          Scores of 19 and below usually indicate a poorer quality of life in these areas.  A difference  of  2-3 points is a clinically meaningful difference.  A difference of 2-3 points in the total score of the Quality of Life Index has been associated with significant improvement in overall quality of life, self-image, physical symptoms, and general health in studies assessing change in quality of life.  PHQ-9: Recent Review Flowsheet Data    Depression screen [facility] 2/9 03/19/2020 05/14/2019   Decreased Interest 1 1   Down, Depressed, Hopeless 1 0   PHQ - 2 Score 2 1   Altered sleeping 0 0   Tired, decreased energy 1 2   Change in appetite 1 0   Feeling bad or failure about yourself  0 0   Trouble concentrating 0 0   Moving slowly or fidgety/restless 0 0   Suicidal thoughts 0 0   PHQ-9 Score 4 3   Difficult doing work/chores Not difficult at all Not difficult at all     Interpretation of Total Score  Total Score Depression Severity:  1-4 = Minimal depression, 5-9 = Mild depression, 10-14 = Moderate depression, 15-19 = Moderately severe depression, 20-27 = Severe depression   Psychosocial  Evaluation and Intervention:  Psychosocial Evaluation - 03/18/20 1132      Psychosocial Evaluation & Interventions   Interventions Encouraged to exercise with the program and follow exercise prescription;Relaxation education;Stress management education    Comments Roberthas no barriers to entering the program.  He has stress concerns with his chronic symptoms and BP elevations. He recently had his Lexapro dose increased from 10 to 20 mg and stated that it is helping with the anxiety of his uncontrolled BP. He also stated that his cardiologist recently told him  that they will continue to search for a reason why his BP goes so high without warning. He was encouraged to hear that his doctor has not stopped trying to figure out what is causing his health issues.  Mustafa lives in his home with his wife. He has a great support system with his wife and family and church family. He has not retired yet, and is presently  on short term disabilty to soon return to work. Cobain should do well with the program .    Expected Outcomes STG: Clancey will be able to attend on consistent basis and manage to work on equipment that will not aggravate his knee or back problems.    LTG: Jaycen will be able to continue the lifestyle changes he learned and started in class.    Continue Psychosocial Services  Follow up required by staff           Psychosocial Re-Evaluation:  Psychosocial Re-Evaluation    Row Name 04/14/20 0813 05/26/20 0806           Psychosocial Re-Evaluation   Current issues with Current Stress Concerns;Current Depression;History of Depression;Current Psychotropic Meds Current Stress Concerns;Current Depression;History of Depression;Current Psychotropic Meds      Comments Rylan has chest pain frequently and his systolic blood pressure is getting high and he is worried that he is going to have another heart attack. He is wearing a heart monitor currently for a month. He is taking medication to help  with his depression and anxiety. Philemon is holding up well mentally. Stays compliant with medications. He recently retired and thinks that will be a big stress relief. He is happy to get more spare time to focus on his health. He is due to see a pulmonologist and EP provider for further evaluation.      Expected  Outcomes Short: talk with cardiologist to help get answers about his health. Long: continue medications and [facility] to deal with stressors. Short: talk with pulmonologist/EP about concerns Long: Utilize exercise to help maintain positive attitude and stress management      Interventions Encouraged to attend Cardiac Rehabilitation for the exercise Encouraged to attend Cardiac Rehabilitation for the exercise      Continue Psychosocial Services  Follow up required by staff Follow up required by staff             Psychosocial Discharge (Final Psychosocial Re-Evaluation):  Psychosocial Re-Evaluation - 05/26/20 0806      Psychosocial Re-Evaluation   Current issues with Current Stress Concerns;Current Depression;History of Depression;Current Psychotropic Meds    Comments Devan is holding up well mentally. Stays compliant with medications. He recently retired and thinks that will be a big stress relief. He is happy to get more spare time to focus on his health. He is due to see a pulmonologist and EP provider for further evaluation.    Expected Outcomes Short: talk with pulmonologist/EP about concerns Long: Utilize exercise to help maintain positive attitude and stress management    Interventions Encouraged to attend Cardiac Rehabilitation for the exercise    Continue Psychosocial Services  Follow up required by staff           Vocational Rehabilitation: Provide vocational rehab assistance to qualifying candidates.   Vocational Rehab Evaluation & Intervention:  Vocational Rehab - 03/18/20 1119      Initial Vocational Rehab Evaluation & Intervention   Assessment shows need for  Vocational Rehabilitation No           Education: Education Goals: Education classes will be provided on a variety of topics geared toward better understanding of heart health and risk factor modification. Participant will state understanding/return demonstration of topics presented as noted by education test scores.  Learning Barriers/Preferences:  Learning Barriers/Preferences - 03/18/20 1119      Learning Barriers/Preferences   Learning Barriers None    Learning Preferences None           General Cardiac Education Topics:  AED/CPR: - Group verbal and written instruction with the use of models to demonstrate the basic use of the AED with the basic ABC's of resuscitation.   Anatomy & Physiology of the Heart: - Group verbal and written instruction and models provide basic cardiac anatomy and physiology, with the coronary electrical and arterial systems. Review of Valvular disease and Heart Failure   Cardiac Procedures: - Group verbal and written instruction to review commonly prescribed medications for heart disease. Reviews the medication, class of the drug, and side effects. Includes the steps to properly store meds and maintain the prescription regimen. (beta blockers and nitrates)   Cardiac Medications I: - Group verbal and written instruction to review commonly prescribed medications for heart disease. Reviews the medication, class of the drug, and side effects. Includes the steps to properly store meds and maintain the prescription regimen.   Cardiac Medications II: -Group verbal and written instruction to review commonly prescribed medications for heart disease. Reviews the medication, class of the drug, and side effects. (all other drug classes)    Go Sex-Intimacy & Heart Disease, Get SMART - Goal Setting: - Group verbal and written instruction through game format to discuss heart disease and the return to sexual intimacy. Provides group verbal and written material  to discuss and apply goal setting through the application of the S.M.A.R.T. Method.   Other Matters of the  Heart: - Provides group verbal, written materials and models to describe Stable Angina and Peripheral Artery. Includes description of the disease process and treatment options available to the cardiac patient.   Infection Prevention: - Provides verbal and written material to individual with discussion of infection control including proper hand washing and proper equipment cleaning during exercise session.   Cardiac Rehab from 05/07/2020 in [facility] Cardiac and Pulmonary Rehab  Date 03/19/20  Educator [facility]  Instruction Review Code 1- Verbalizes Understanding      Falls Prevention: - Provides verbal and written material to individual with discussion of falls prevention and safety.   Cardiac Rehab from 05/07/2020 in [facility] Cardiac and Pulmonary Rehab  Date 03/19/20  Educator [facility]  Instruction Review Code 1- Verbalizes Understanding      Other: -Provides group and verbal instruction on various topics (see comments)   Knowledge Questionnaire Score:  Knowledge Questionnaire Score - 03/19/20 1040      Knowledge Questionnaire Score   Pre Score 23/26 Education Focus: Nutrition and Angina           Core Components/Risk Factors/Patient Goals at Admission:  Personal Goals and Risk Factors at Admission - 03/19/20 1040      Core Components/Risk Factors/Patient Goals on Admission    Weight Management Yes;Weight Loss;Obesity    Intervention Weight Management: Develop a combined nutrition and exercise program designed to reach desired caloric intake, while maintaining appropriate intake of nutrient and fiber, sodium and fats, and appropriate energy expenditure required for the weight goal.;Weight Management: Provide education and appropriate resources to help participant work on and attain dietary goals.;Obesity: Provide education and appropriate resources to help participant work on and attain  dietary goals.;Weight Management/Obesity: Establish reasonable short term and long term weight goals.    Admit Weight 257 lb 1.6 oz (116.6 kg)    Goal Weight: Short Term 252 lb (114.3 kg)    Goal Weight: Long Term 185 lb (83.9 kg)    Expected Outcomes Short Term: Continue to assess and modify interventions until short term weight is achieved;Long Term: Adherence to nutrition and physical activity/exercise program aimed toward attainment of established weight goal;Weight Loss: Understanding of general recommendations for a balanced deficit meal plan, which promotes 1-2 lb weight loss per week and includes a negative energy balance of [phone] kcal/d;Understanding recommendations for meals to include 15-35% energy as protein, 25-35% energy from fat, 35-60% energy from carbohydrates, less than $RemoveB'200mg'tHYaYexw$  of dietary cholesterol, 20-35 gm of total fiber daily;Understanding of distribution of calorie intake throughout the day with the consumption of 4-5 meals/snacks    Diabetes Yes    Intervention Provide education about signs/symptoms and action to take for hypo/hyperglycemia.;Provide education about proper nutrition, including hydration, and aerobic/resistive exercise prescription along with prescribed medications to achieve blood glucose in normal ranges: Fasting glucose 65-99 mg/dL    Expected Outcomes Short Term: Participant verbalizes understanding of the signs/symptoms and immediate care of hyper/hypoglycemia, proper foot care and importance of medication, aerobic/resistive exercise and nutrition plan for blood glucose control.;Long Term: Attainment of HbA1C < 7%.    Hypertension Yes    Intervention Provide education on lifestyle modifcations including regular physical activity/exercise, weight management, moderate sodium restriction and increased consumption of fresh fruit, vegetables, and low fat dairy, alcohol moderation, and smoking cessation.;Monitor prescription use compliance.    Expected Outcomes Short  Term: Continued assessment and intervention until BP is < 140/42mm HG in hypertensive participants. < 130/25mm HG in hypertensive participants with diabetes, heart failure or chronic kidney disease.;Long Term:  Maintenance of blood pressure at goal levels.    Lipids Yes    Intervention Provide education and support for participant on nutrition & aerobic/resistive exercise along with prescribed medications to achieve LDL '70mg'$ , HDL >$Remo'40mg'wkhQV$ .    Expected Outcomes Short Term: Participant states understanding of desired cholesterol values and is compliant with medications prescribed. Participant is following exercise prescription and nutrition guidelines.;Long Term: Cholesterol controlled with medications as prescribed, with individualized exercise RX and with personalized nutrition plan. Value goals: LDL < $Rem'70mg'zIru$ , HDL > 40 mg.           Education:Diabetes - Individual verbal and written instruction to review signs/symptoms of diabetes, desired ranges of glucose level fasting, after meals and with exercise. Acknowledge that pre and post exercise glucose checks will be done for 3 sessions at entry of program.   Cardiac Rehab from 05/07/2020 in [facility] Cardiac and Pulmonary Rehab  Date 03/19/20  Educator [facility]  Instruction Review Code 1- Verbalizes Understanding      Education: Know Your Numbers and Risk Factors: -Group verbal and written instruction about important numbers in your health.  Discussion of what are risk factors and how they play a role in the disease process.  Review of Cholesterol, Blood Pressure, Diabetes, and BMI and the role they play in your overall health.   Core Components/Risk Factors/Patient Goals Review:   Goals and Risk Factor Review    Row Name 04/14/20 0809 05/26/20 0759           Core Components/Risk Factors/Patient Goals Review   Personal Goals Review Weight Management/Obesity;Diabetes;Lipids;Hypertension Weight Management/Obesity;Diabetes;Lipids;Hypertension      Review  Dayne has been having chest pain and has been having bouts with high blood pressure at times. He is meeting with his cardiologist today for a follow up appointment. He has been doing well with his diabetes and is monitoring his sugar at home. His on going chest pain is his main concern at this point in time. Tysheem is still having chest pain periodically at home, explains he knows how to take nitro as directed. Takes BP at home, reports mostly stables BP, sometimes it stays elevated and will recheck and comes back down. Takes blood sugars periodically but not daily, reports stable values. Benett weighs himself at home, stays stable- hasn't focused too much on weight loss, he is retired now and plans to to plan out his meals and not eat out as much      Expected Outcomes Short: meet with cardiologist  Long: maintain chest pain and blood pressure independently Short: Focus on preparing meals at home, eat out less to help with weight loss Long: Continue to manage lifestyle factors             Core Components/Risk Factors/Patient Goals at Discharge (Final Review):   Goals and Risk Factor Review - 05/26/20 0759      Core Components/Risk Factors/Patient Goals Review   Personal Goals Review Weight Management/Obesity;Diabetes;Lipids;Hypertension    Review Darrek is still having chest pain periodically at home, explains he knows how to take nitro as directed. Takes BP at home, reports mostly stables BP, sometimes it stays elevated and will recheck and comes back down. Takes blood sugars periodically but not daily, reports stable values. Dimitrius weighs himself at home, stays stable- hasn't focused too much on weight loss, he is retired now and plans to to plan out his meals and not eat out as much    Expected Outcomes Short: Focus on preparing meals at home, eat out  less to help with weight loss Long: Continue to manage lifestyle factors           ITP Comments:  ITP Comments    Row Name 03/18/20 1129  03/19/20 0926 03/24/20 0854 04/01/20 0652 04/09/20 0826   ITP Comments Virtual Orientation completed today. Has an appt for EP Eval and gym orientation tomorrow at 8 AM. Documentation of Diagnosis can be found in [facility] 02/17/2020 Completed 6MWT and gym orientation.  Initial ITP created and sent for review to Dr. Emily Filbert, Medical Director. First full day of exercise!  Patient was oriented to gym and equipment including functions, settings, policies, and procedures.  Patient's individual exercise prescription and treatment plan were reviewed.  All starting workloads were established based on the results of the 6 minute walk test done at initial orientation visit.  The plan for exercise progression was also introduced and progression will be customized based on patient's performance and goals. 30 Day review completed. Medical Director ITP review done, changes made as directed, and signed approval by Medical Director. Konnor is wearing a heart monitor for a little while.   Row Name 04/29/20 1116 05/27/20 0744 06/01/20 1339 06/15/20 1421     ITP Comments 30 Day review completed. Medical Director ITP review done, changes made as directed, and signed approval by Medical Director. 30 Day review completed. Medical Director ITP review done, changes made as directed, and signed approval by Medical Director. 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  He and wife are not vaccinated, but still considering it.  Did let him know that there are other options as well if not receiving vaccine.  Pt to stay out for week. Hoy called to let us know that he is still apprehensive about COVID.  He would like to discharge at this time.           Comments: Discharge ITP

## 2020-06-30 DIAGNOSIS — I480 Paroxysmal atrial fibrillation: Secondary | ICD-10-CM | POA: Insufficient documentation

## 2021-11-12 ENCOUNTER — Encounter: Payer: Self-pay | Admitting: *Deleted

## 2021-11-15 ENCOUNTER — Ambulatory Visit
Admission: RE | Admit: 2021-11-15 | Payer: Managed Care, Other (non HMO) | Source: Home / Self Care | Admitting: *Deleted

## 2021-11-15 ENCOUNTER — Encounter: Admission: RE | Payer: Self-pay | Source: Home / Self Care

## 2021-11-15 SURGERY — ESOPHAGOGASTRODUODENOSCOPY (EGD) WITH PROPOFOL
Anesthesia: General

## 2021-11-15 MED ORDER — PROPOFOL 500 MG/50ML IV EMUL
INTRAVENOUS | Status: AC
  Filled 2021-11-15: qty 50

## 2021-12-20 ENCOUNTER — Other Ambulatory Visit (HOSPITAL_COMMUNITY): Payer: Self-pay | Admitting: Ophthalmology

## 2021-12-20 ENCOUNTER — Other Ambulatory Visit: Payer: Self-pay | Admitting: Ophthalmology

## 2021-12-20 DIAGNOSIS — H53483 Generalized contraction of visual field, bilateral: Secondary | ICD-10-CM

## 2022-01-01 ENCOUNTER — Ambulatory Visit
Admission: RE | Admit: 2022-01-01 | Discharge: 2022-01-01 | Disposition: A | Discharge status: Home / Self Care | Payer: Managed Care, Other (non HMO) | Source: Ambulatory Visit | Attending: Ophthalmology | Admitting: Ophthalmology

## 2022-01-01 ENCOUNTER — Ambulatory Visit: Payer: Managed Care, Other (non HMO)

## 2022-01-01 ENCOUNTER — Other Ambulatory Visit: Payer: Self-pay

## 2022-01-01 DIAGNOSIS — H53483 Generalized contraction of visual field, bilateral: Secondary | ICD-10-CM

## 2022-01-01 IMAGING — MR MR HEAD WO/W CM
15 series · 48 of 48 positions shown · IV contrast (gadavist)
Comparison: None.

CLINICAL DATA: 65-year-old male with abrupt onset right orbit pain
with upward gaze and gradual tunnel vision with loss of peripheral
vision. Daily headaches, but no known injury.

EXAM:
MRI HEAD AND ORBITS WITHOUT AND WITH CONTRAST
TECHNIQUE: Multiplanar, multiecho pulse sequences of the brain and surrounding
structures were obtained without and with intravenous contrast.
Multiplanar, multiecho pulse sequences of the orbits and surrounding
structures were obtained including fat saturation techniques, before
and after intravenous contrast administration.
CONTRAST:  10mL GADAVIST GADOBUTROL 1 MMOL/ML IV SOLN

[Series 5: ax dwi_tracew · axial · 3.0mm · 0.65mm/px · z∈[-73,+81]mm · 2 of 48 slices shown]
[im 1/48]
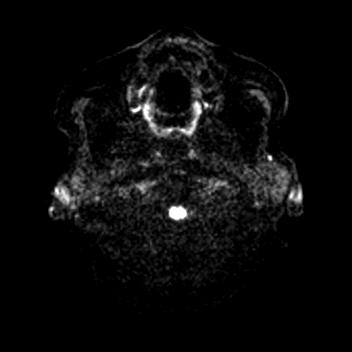
[im 48/48]
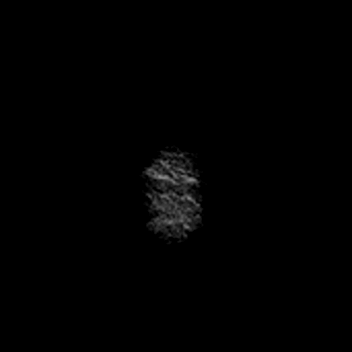

[Series 6: ax dwi_adc · axial · 3.0mm · 0.65mm/px · z∈[-73,+81]mm · 3 of 48 slices shown]
[im 1/48]
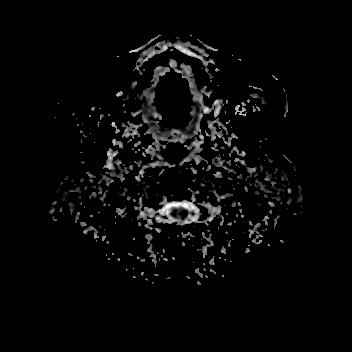
[im 24/48]
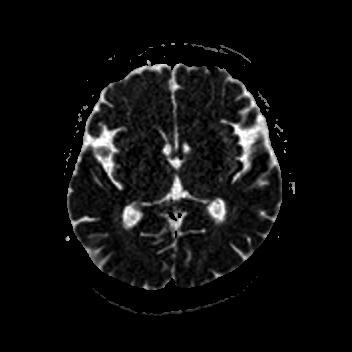
[im 48/48]
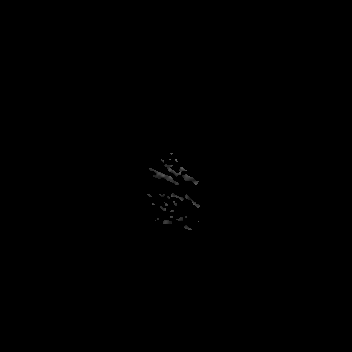

[Series 7: cor dwi_tracew · coronal · 5.0mm · 0.68mm/px · 2 of 40 slices shown]
[im 1/40]
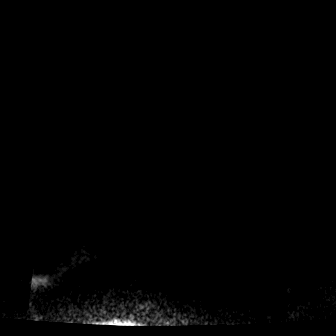
[im 40/40]
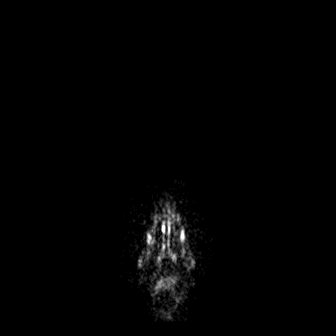

[Series 8: cor dwi_adc · coronal · 5.0mm · 0.68mm/px · 2 of 40 slices shown]
[im 1/40]
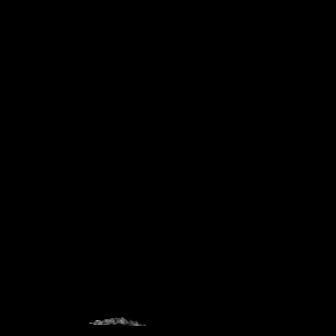
[im 40/40]
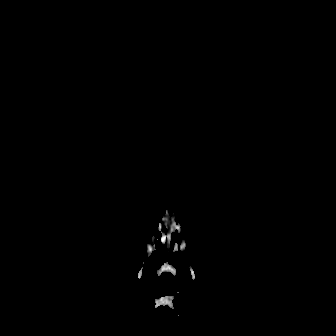

[Series 9: T1 · sagittal · 5.0mm · 0.62mm/px · 1 of 25 slices shown (1 of 2)]
[im 1/25]
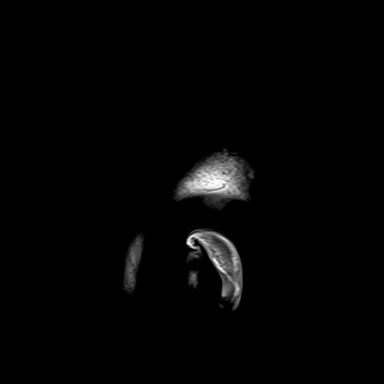

[Series 10: T2 · axial · 5.0mm · 0.53mm/px · 1 of 25 slices shown]
[im 1/25]
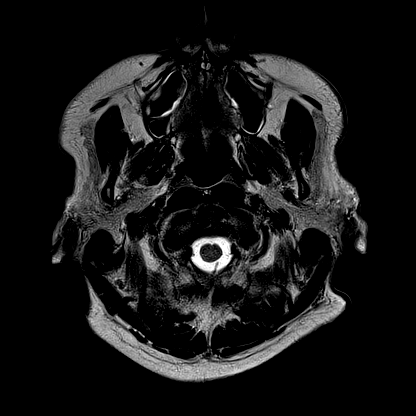

[Series 11: mag_images · axial · 3.0mm · 0.90mm/px · z∈[-77,+86]mm · 3 of 56 slices shown]
[im 1/56]
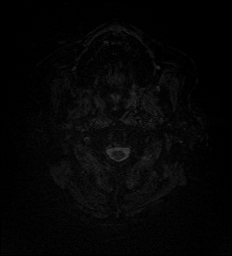
[im 28/56]
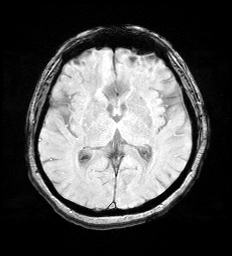
[im 56/56]
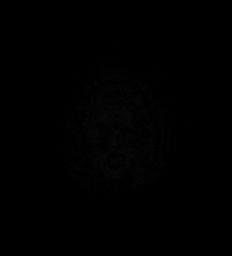

[Series 12: pha_images · axial · 3.0mm · 0.90mm/px · z∈[-77,+86]mm · 3 of 56 slices shown]
[im 1/56]
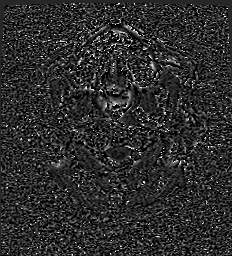
[im 28/56]
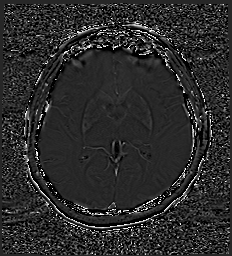
[im 56/56]
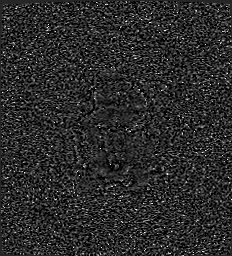

[Series 13: swi_images · axial · 3.0mm · 0.90mm/px · z∈[-77,+86]mm · 3 of 56 slices shown]
[im 1/56]
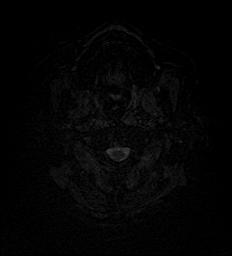
[im 28/56]
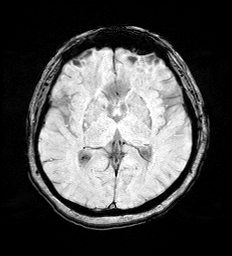
[im 56/56]
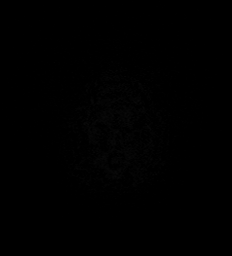

[Series 14: mip_images(sw) · axial · 24.0mm · 0.90mm/px · z∈[-67,+76]mm · 3 of 49 slices shown]
[im 1/49]
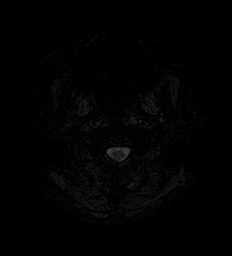
[im 25/49]
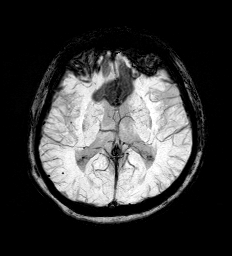
[im 49/49]
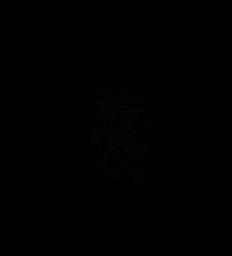

[Series 15: FLAIR · axial · 3.0mm · 0.53mm/px · z∈[-77,+84]mm · 3 of 55 slices shown]
[im 1/55]
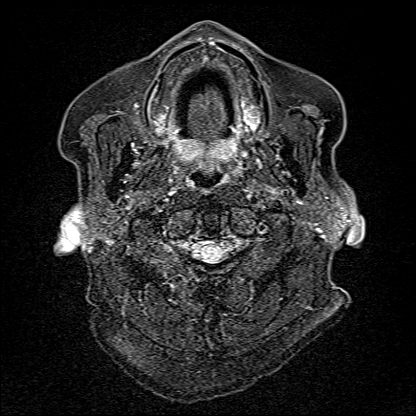
[im 28/55]
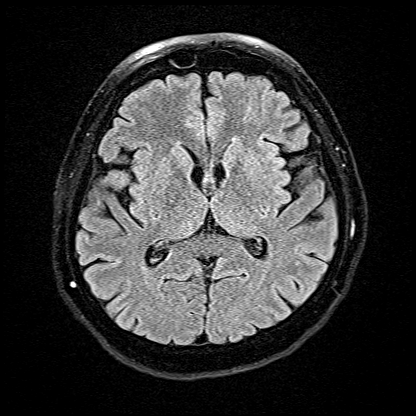
[im 55/55]
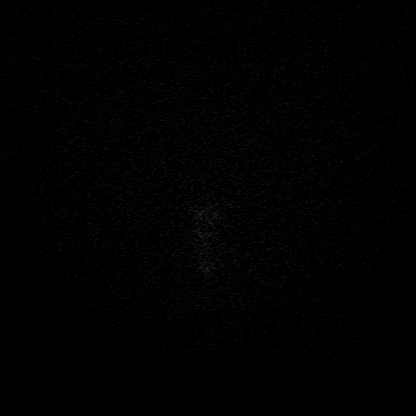

[Series 16: T1 · axial · 1.0mm · 0.98mm/px · z∈[-70,+101]mm · 9 of 176 slices shown (2 of 2)]
[im 1/176]
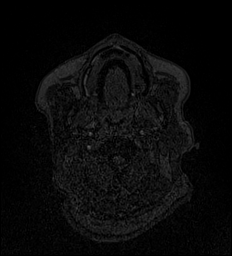
[im 22/176]
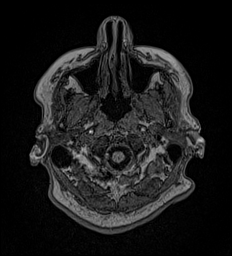
[im 44/176]
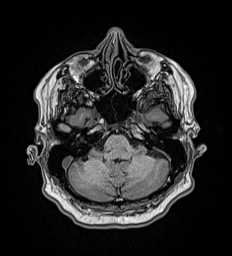
[im 66/176]
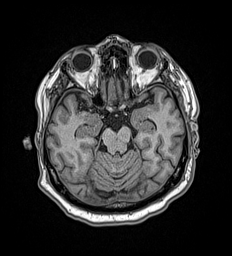
[im 88/176]
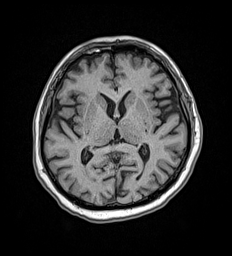
[im 110/176]
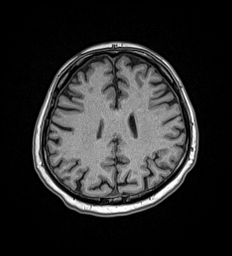
[im 132/176]
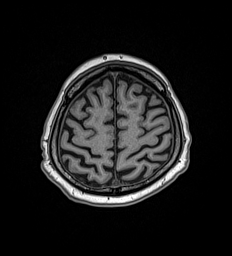
[im 154/176]
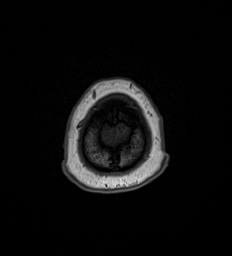
[im 176/176]
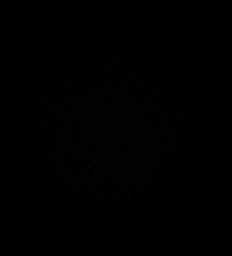

[Series 17: T2 post-contrast · coronal · 5.0mm · 0.57mm/px · 2 of 29 slices shown]
[im 1/29]
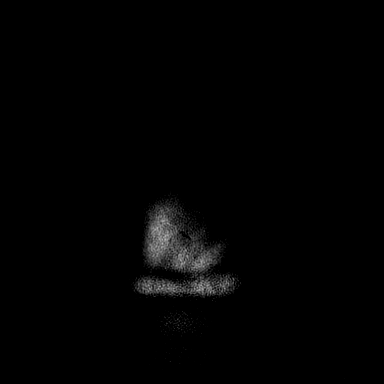
[im 29/29]
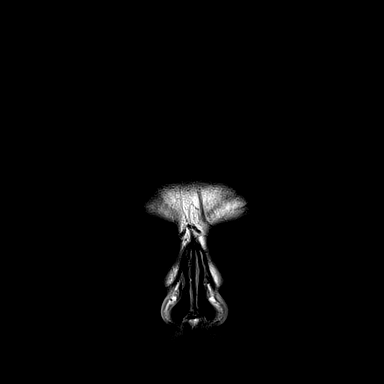

[Series 18: T1 post-contrast · axial · 1.0mm · 0.98mm/px · z∈[-70,+101]mm · 9 of 176 slices shown (1 of 2)]
[im 1/176]
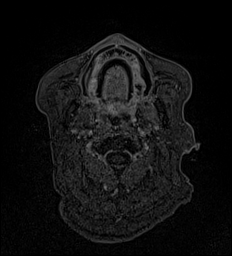
[im 22/176]
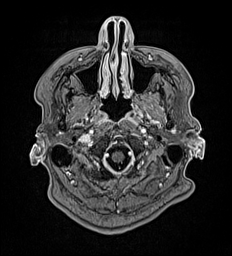
[im 44/176]
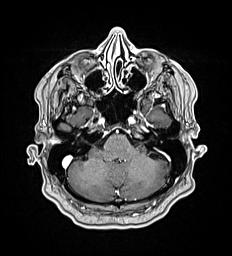
[im 66/176]
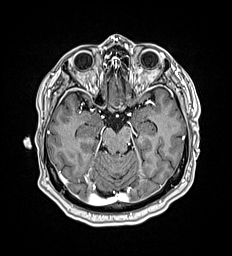
[im 88/176]
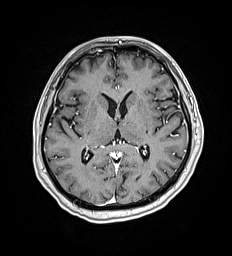
[im 110/176]
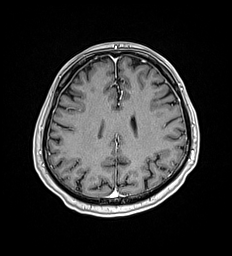
[im 132/176]
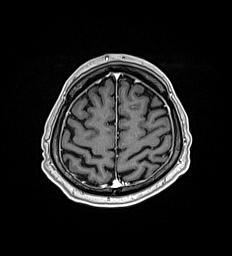
[im 154/176]
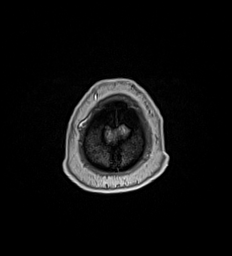
[im 176/176]
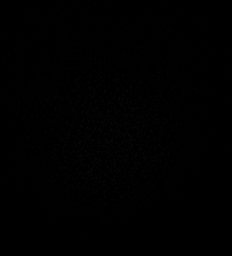

[Series 19: T1 post-contrast · coronal · 5.0mm · 0.57mm/px · 2 of 29 slices shown (2 of 2)]
[im 1/29]
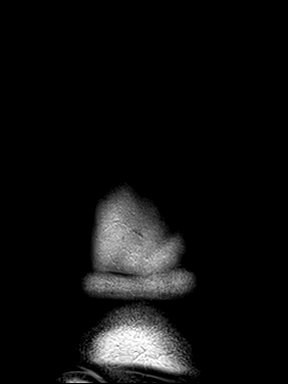
[im 29/29]
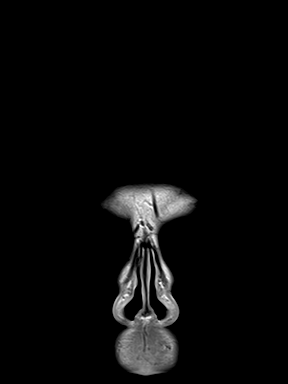

[48 of 48 positions shown; findings below may reference images not displayed]

FINDINGS: MRI HEAD FINDINGS

Brain: No restricted diffusion to suggest acute infarction. No
midline shift, mass effect, evidence of mass lesion,
ventriculomegaly, extra-axial collection or acute intracranial
hemorrhage. Cervicomedullary junction and pituitary are within
normal limits.

Cerebral volume is within normal limits for age. Largely normal gray
and white matter signal for age throughout the brain, with minimal
nonspecific white matter T2 and FLAIR hyperintensity in the left
hemisphere. No cortical encephalomalacia or chronic cerebral blood
products. Deep gray matter nuclei, brainstem and cerebellum appear
negative.

No abnormal enhancement identified.  No dural thickening.

Vascular: Major intracranial vascular flow voids are preserved. The
major dural venous sinuses are enhancing and appear to be patent.

Skull and upper cervical spine: Negative visible cervical spine.
Visualized bone marrow signal is within normal limits.

Other: Mastoids are clear. Visible internal auditory structures
appear normal. Negative visible scalp and face.

MRI ORBITS FINDINGS

Orbits: Normal suprasellar cistern. Optic chiasm appears normal.
Cavernous sinuses appear symmetric and within normal limits. Optic
nerves appear symmetric and within normal limits. Globes appear
symmetric and normal. No abnormal enhancement identified. No
intraorbital mass or inflammation identified. Lacrimal glands appear
symmetric and diminutive. No extraocular muscle enlargement.
Diminutive superior ophthalmic veins. Superficial periauricular soft
tissues appear symmetric and within normal limits.

Other findings: Probable asymmetric pneumatization of the right
anterior clinoid process (normal variant).

Visualized sinuses: Trace paranasal sinus mucosal thickening,
significance doubtful.

Soft tissues: Negative visible deep soft tissue spaces of the face.
IMPRESSION: 1. Normal MRI appearance of both orbits. No imaging explanation for
pain or vision loss.
2. No acute intracranial abnormality and normal for age MRI
appearance of the brain.

## 2022-01-01 IMAGING — MR MR ORBITS WO/W CM
5 of 7 series · 27 of 48 positions shown · IV contrast (gadavist)
Comparison: None.

CLINICAL DATA: 65-year-old male with abrupt onset right orbit pain
with upward gaze and gradual tunnel vision with loss of peripheral
vision. Daily headaches, but no known injury.

EXAM:
MRI HEAD AND ORBITS WITHOUT AND WITH CONTRAST
TECHNIQUE: Multiplanar, multiecho pulse sequences of the brain and surrounding
structures were obtained without and with intravenous contrast.
Multiplanar, multiecho pulse sequences of the orbits and surrounding
structures were obtained including fat saturation techniques, before
and after intravenous contrast administration.
CONTRAST:  10mL GADAVIST GADOBUTROL 1 MMOL/ML IV SOLN

[Series 5: T2 · axial · 3.0mm · 0.78mm/px · z∈[-50,+14]mm · 3 of 17 slices shown (1 of 2)]
[im 1/17]
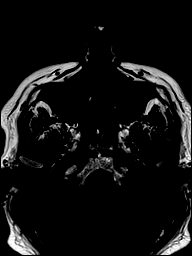
[im 9/17]
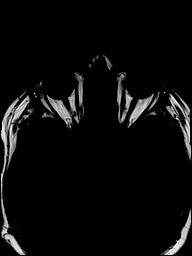
[im 17/17]
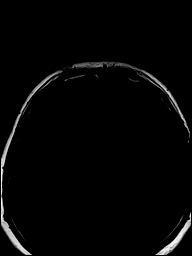

[Series 6: T2 · axial · 3.0mm · 0.78mm/px · z∈[-50,+14]mm · 4 of 17 slices shown (2 of 2)]
[im 1/17]
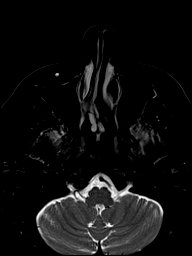
[im 6/17]
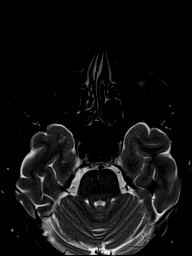
[im 11/17]
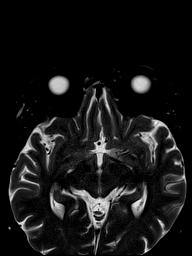
[im 17/17]
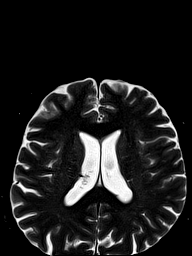

[Series 7: T1 · axial · non-contrast · 3.0mm · 0.31mm/px · z∈[-53,+12]mm · 6 of 23 slices shown (1 of 2)]
[im 1/23]
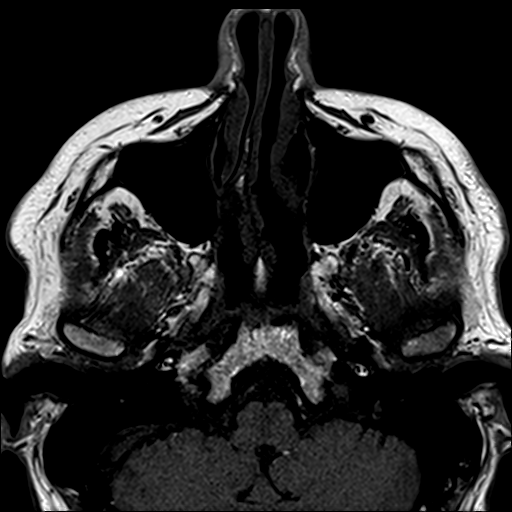
[im 5/23]
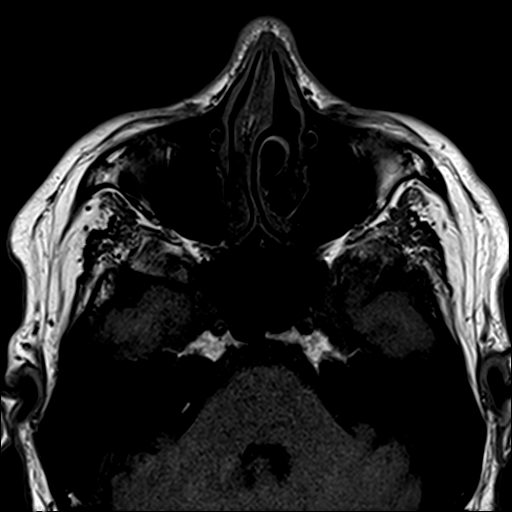
[im 9/23]
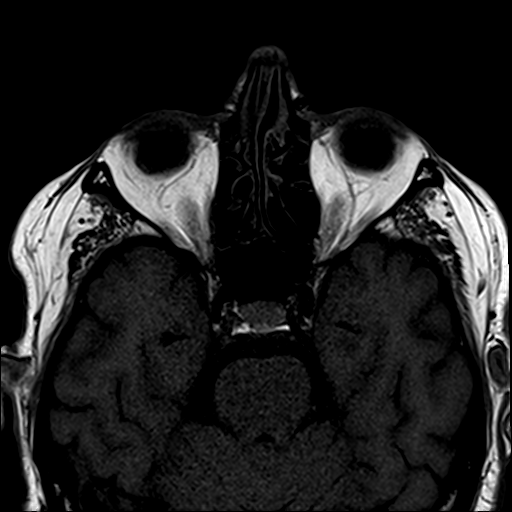
[im 14/23]
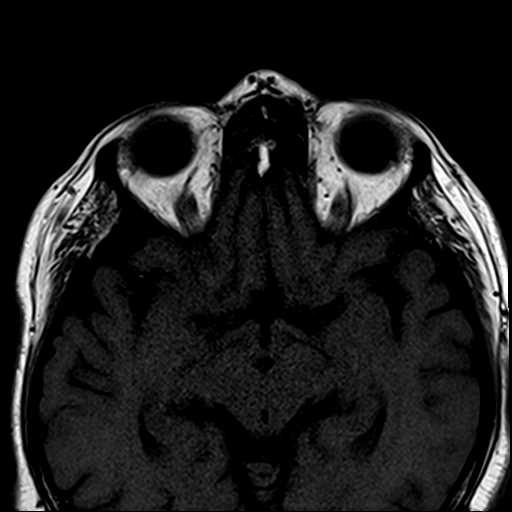
[im 18/23]
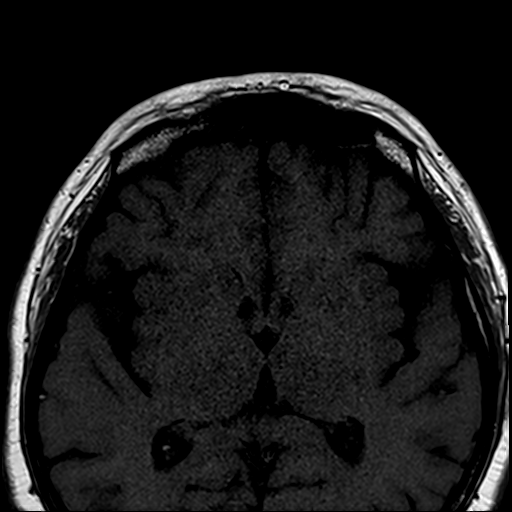
[im 23/23]
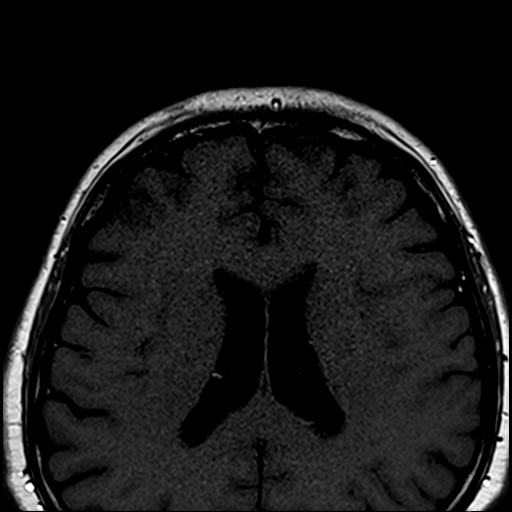

[Series 8: T1 · coronal · non-contrast · 3.0mm · 0.35mm/px · 9 of 39 slices shown (2 of 2)]
[im 1/39]
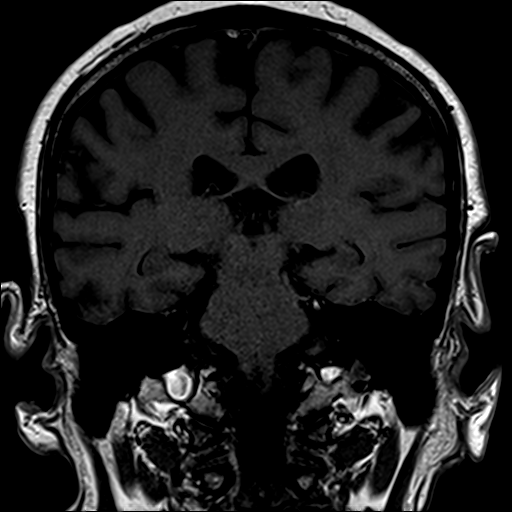
[im 5/39]
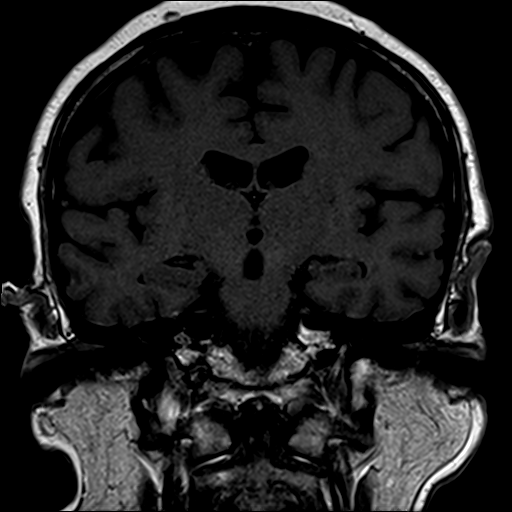
[im 10/39]
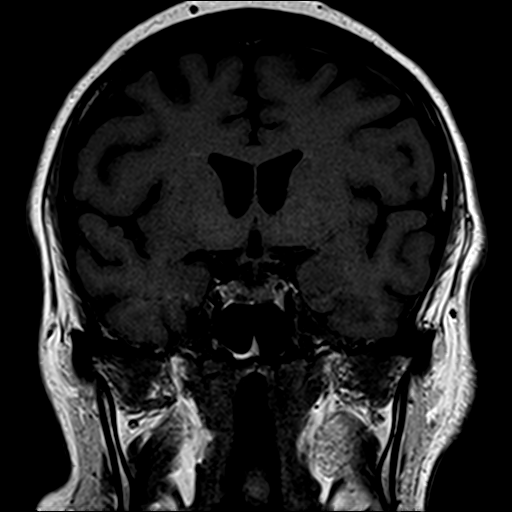
[im 15/39]
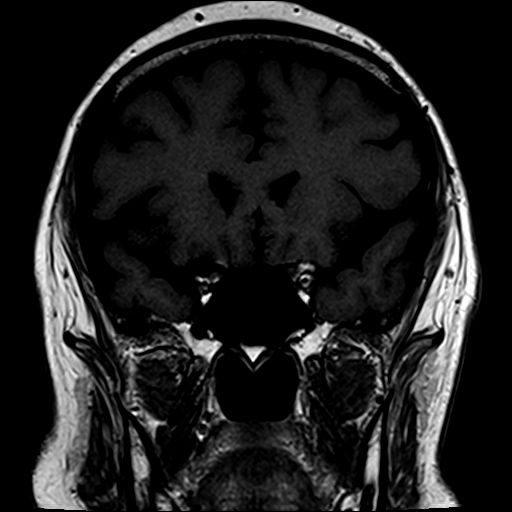
[im 20/39]
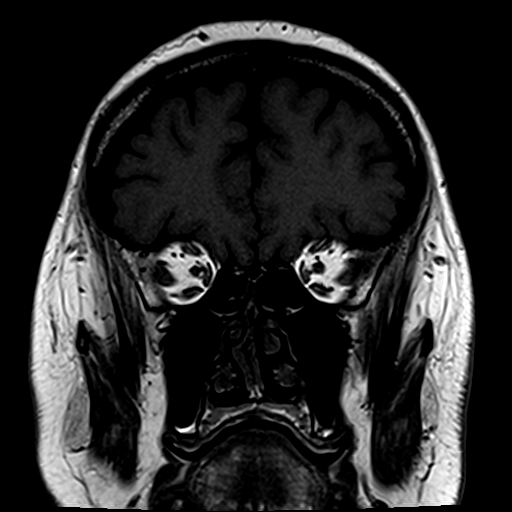
[im 24/39]
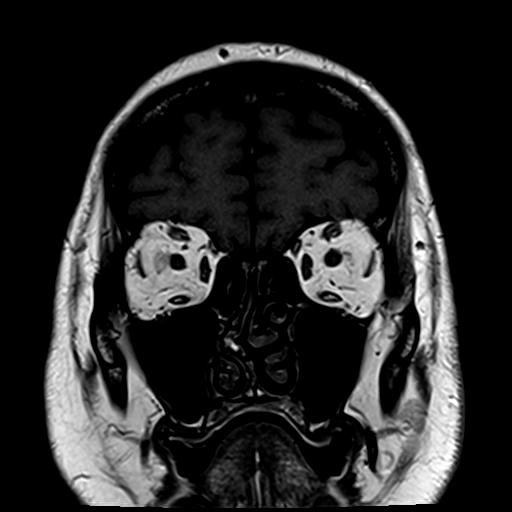
[im 29/39]
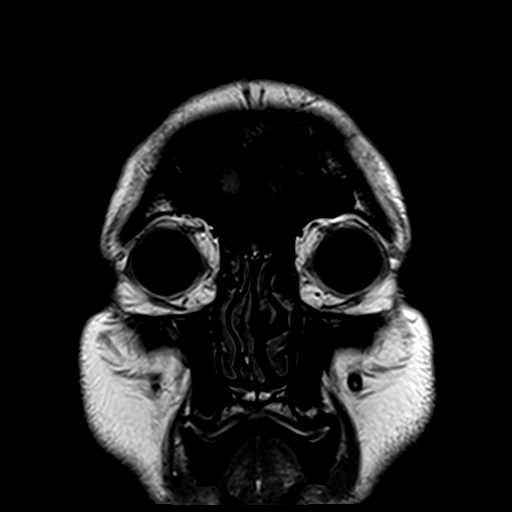
[im 34/39]
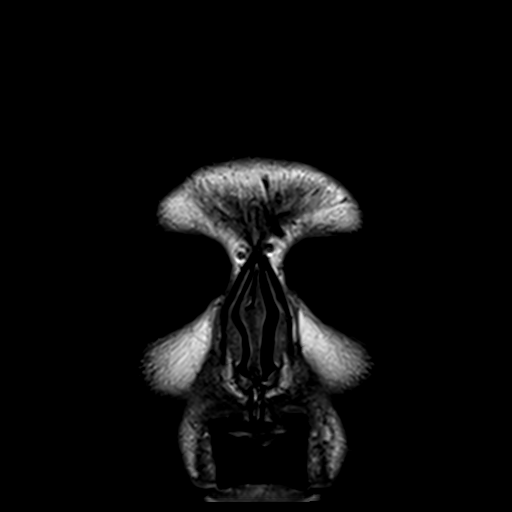
[im 39/39]
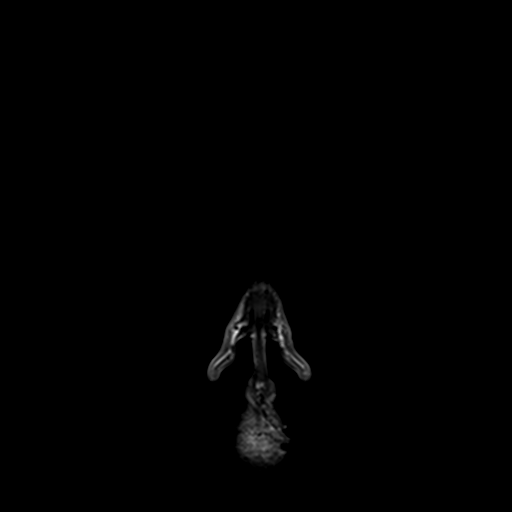

[Series 9: T1 fat-sat · coronal · 3.0mm · 0.70mm/px · 5 of 40 slices shown]
[im 1/40]
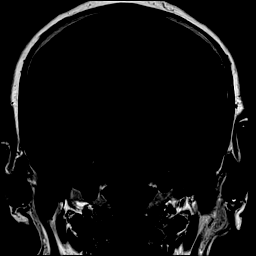
[im 5/40]
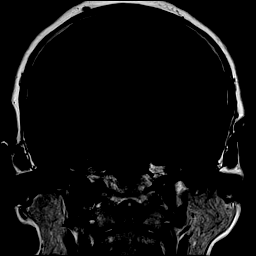
[im 14/40]
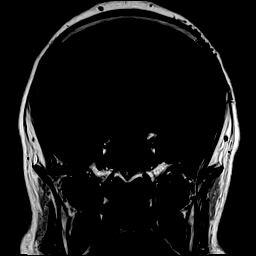
[im 18/40]
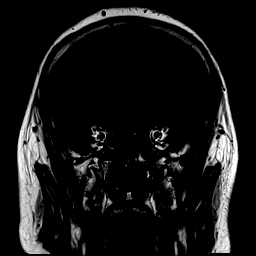
[im 22/40]
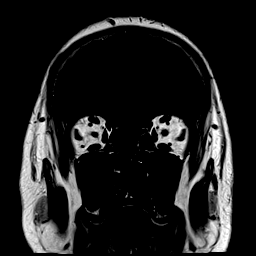

[27 of 48 positions shown; findings below may reference images not displayed]

FINDINGS: MRI HEAD FINDINGS

Brain: No restricted diffusion to suggest acute infarction. No
midline shift, mass effect, evidence of mass lesion,
ventriculomegaly, extra-axial collection or acute intracranial
hemorrhage. Cervicomedullary junction and pituitary are within
normal limits.

Cerebral volume is within normal limits for age. Largely normal gray
and white matter signal for age throughout the brain, with minimal
nonspecific white matter T2 and FLAIR hyperintensity in the left
hemisphere. No cortical encephalomalacia or chronic cerebral blood
products. Deep gray matter nuclei, brainstem and cerebellum appear
negative.

No abnormal enhancement identified.  No dural thickening.

Vascular: Major intracranial vascular flow voids are preserved. The
major dural venous sinuses are enhancing and appear to be patent.

Skull and upper cervical spine: Negative visible cervical spine.
Visualized bone marrow signal is within normal limits.

Other: Mastoids are clear. Visible internal auditory structures
appear normal. Negative visible scalp and face.

MRI ORBITS FINDINGS

Orbits: Normal suprasellar cistern. Optic chiasm appears normal.
Cavernous sinuses appear symmetric and within normal limits. Optic
nerves appear symmetric and within normal limits. Globes appear
symmetric and normal. No abnormal enhancement identified. No
intraorbital mass or inflammation identified. Lacrimal glands appear
symmetric and diminutive. No extraocular muscle enlargement.
Diminutive superior ophthalmic veins. Superficial periauricular soft
tissues appear symmetric and within normal limits.

Other findings: Probable asymmetric pneumatization of the right
anterior clinoid process (normal variant).

Visualized sinuses: Trace paranasal sinus mucosal thickening,
significance doubtful.

Soft tissues: Negative visible deep soft tissue spaces of the face.
IMPRESSION: 1. Normal MRI appearance of both orbits. No imaging explanation for
pain or vision loss.
2. No acute intracranial abnormality and normal for age MRI
appearance of the brain.

## 2022-01-01 MED ORDER — GADOBUTROL 1 MMOL/ML IV SOLN
10.0000 mL | Freq: Once | INTRAVENOUS | Status: AC | PRN
  Administered 2022-01-01: 10 mL via INTRAVENOUS

## 2022-01-04 ENCOUNTER — Other Ambulatory Visit: Payer: Self-pay | Admitting: Ophthalmology

## 2022-01-04 DIAGNOSIS — R519 Headache, unspecified: Secondary | ICD-10-CM

## 2022-01-04 DIAGNOSIS — H5711 Ocular pain, right eye: Secondary | ICD-10-CM

## 2022-01-04 DIAGNOSIS — H53483 Generalized contraction of visual field, bilateral: Secondary | ICD-10-CM

## 2022-01-04 DIAGNOSIS — G450 Vertebro-basilar artery syndrome: Secondary | ICD-10-CM

## 2022-01-10 DIAGNOSIS — Z95820 Peripheral vascular angioplasty status with implants and grafts: Secondary | ICD-10-CM | POA: Insufficient documentation

## 2022-01-10 DIAGNOSIS — Z87891 Personal history of nicotine dependence: Secondary | ICD-10-CM | POA: Insufficient documentation

## 2022-01-14 ENCOUNTER — Ambulatory Visit: Payer: Medicare Other

## 2022-01-17 ENCOUNTER — Ambulatory Visit
Admission: RE | Admit: 2022-01-17 | Discharge: 2022-01-17 | Disposition: A | Discharge status: Home / Self Care | Payer: Managed Care, Other (non HMO) | Source: Ambulatory Visit | Attending: Ophthalmology | Admitting: Ophthalmology

## 2022-01-17 DIAGNOSIS — G450 Vertebro-basilar artery syndrome: Secondary | ICD-10-CM | POA: Insufficient documentation

## 2022-01-17 IMAGING — CT CT ANGIO NECK
2 of 7 series · 8 of 33 positions shown · IV contrast (APPLIED)
Comparison: None.

CLINICAL DATA: Vertebrobasilar insufficiency



[Series 4: cta neck · axial · 0.59mm/px · z∈[-250,-152]mm · 2 of 147 slices shown]
[im 49/147  soft-tissue]
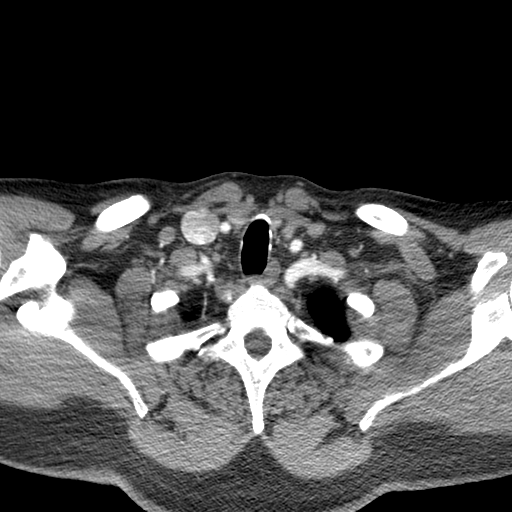
[im 98/147  soft-tissue]
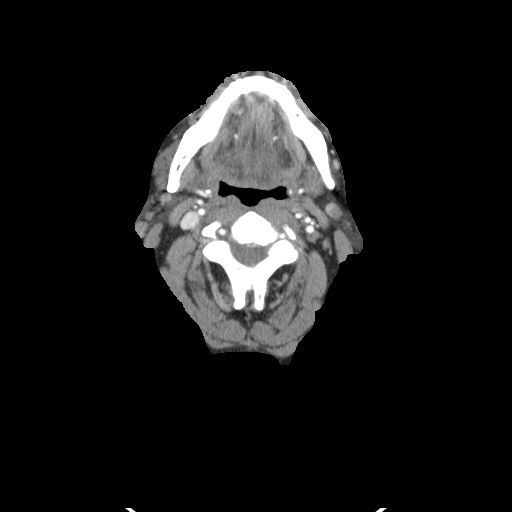

[Series 6: ax thin · axial · 0.49mm/px · z∈[-312,-96]mm · 6 of 304 slices shown]
[im 44/304  soft-tissue]
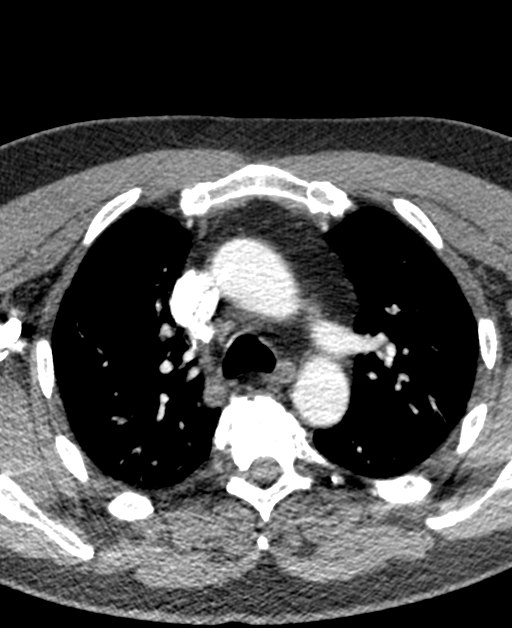
[im 87/304  bone]
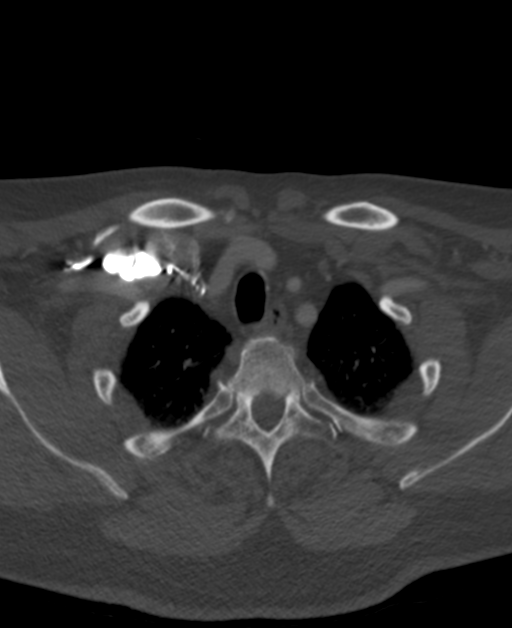
[im 130/304  soft-tissue]
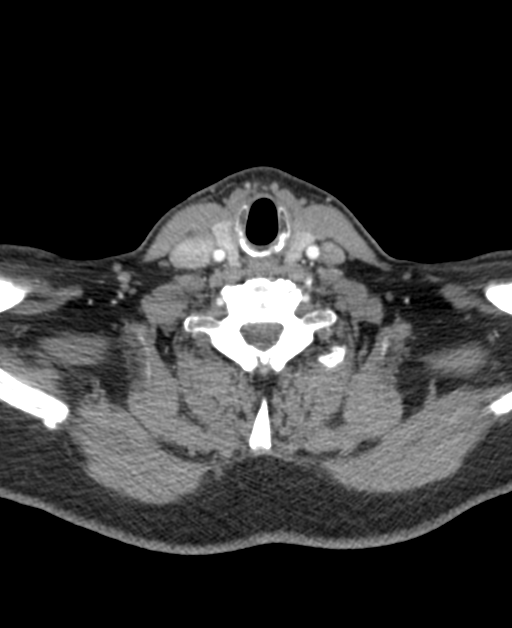
[im 174/304  bone]
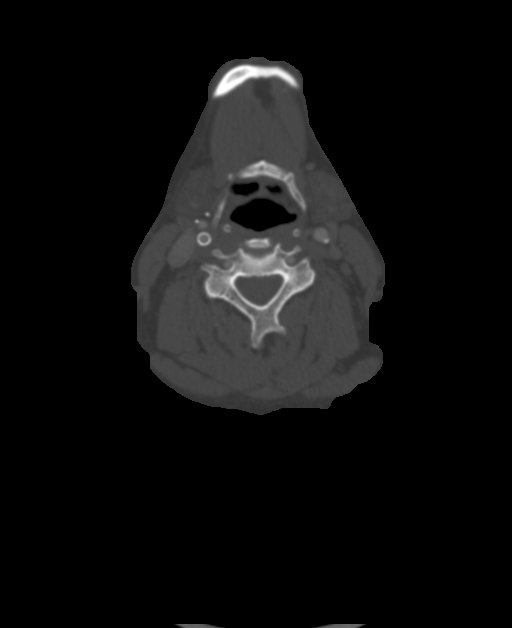
[im 217/304  soft-tissue]
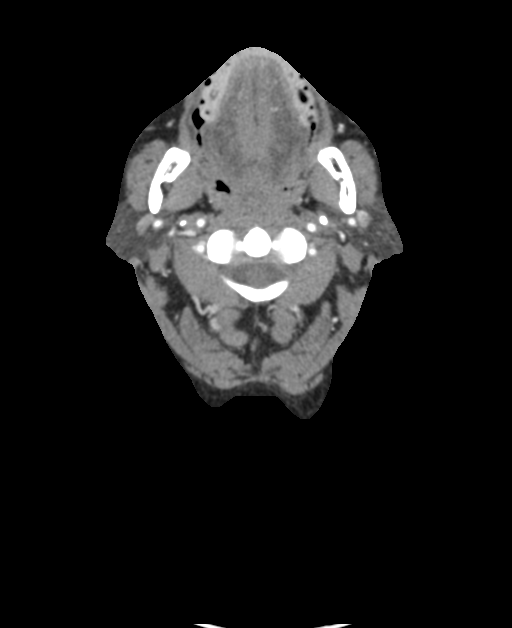
[im 260/304  bone]
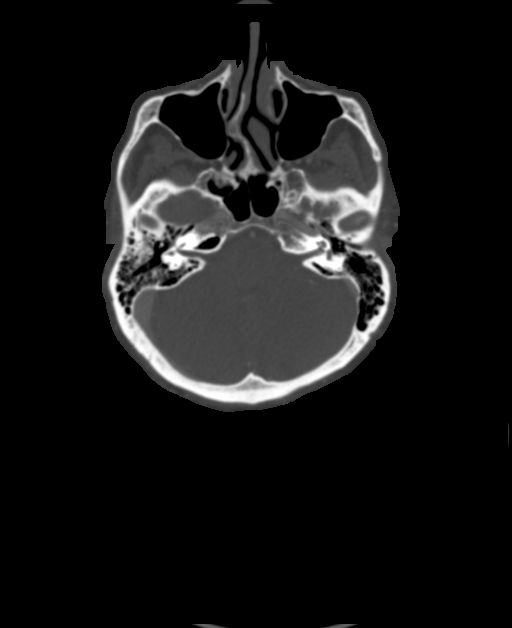

[8 of 33 positions shown; findings below may reference images not displayed]

RADIATION DOSE REDUCTION: This exam was performed according to the
departmental dose-optimization program which includes automated
exposure control, adjustment of the mA and/or kV according to
patient size and/or use of iterative reconstruction technique.

CONTRAST:  75mL OMNIPAQUE IOHEXOL 350 MG/ML SOLN
FINDINGS: Aortic arch: Standard branching. Imaged portion shows no evidence of
aneurysm or dissection. No significant stenosis of the major arch
vessel origins. Aortic atherosclerosis.

Right carotid system: No evidence of dissection, stenosis (50% or
greater) or occlusion. A stent is noted across the distal CCA and
proximal ICA, which appears patent. The ECA is patent.
Calcifications in the imaged intracranial ICA do not appear
hemodynamically significant.

Left carotid system: Calcified and noncalcified plaque at the
bifurcation and in the proximal ICA cause approximately 50%
stenosis. No evidence of occlusion or dissection. Calcifications in
the imaged portions of the intracranial ICAs do not cause
hemodynamically significant stenosis.

Vertebral arteries: The left vertebral artery is patent from its
origin to the vertebrobasilar junction. Moderate narrowing at the
origin of the right vertebral artery, which is otherwise patent to
the vertebrobasilar junction. No evidence of occlusion or
dissection.

Skeleton: Degenerative changes in the cervical spine. No acute
osseous abnormality. Edentulous.

Other neck: Negative.

Upper chest: No focal pulmonary opacity or pleural effusion.
Emphysema.
IMPRESSION: 1. Approximate 50% stenosis in the proximal left ICA.
2. Right carotid stent, which appears patent.
3. Moderate narrowing at the origin of the right vertebral artery,
which is otherwise patent.
4. Aortic Atherosclerosis ([2V]-[2V]) and Emphysema ([2V]-[2V]).

## 2022-01-17 MED ORDER — IOHEXOL 350 MG/ML SOLN
75.0000 mL | Freq: Once | INTRAVENOUS | Status: AC | PRN
  Administered 2022-01-17: 75 mL via INTRAVENOUS

## 2022-01-17 MED ORDER — DIPHENHYDRAMINE HCL 50 MG PO CAPS
50.0000 mg | ORAL_CAPSULE | Freq: Once | ORAL | Status: DC
  Filled 2022-01-17: qty 1

## 2022-01-17 MED ORDER — DIPHENHYDRAMINE HCL 50 MG/ML IJ SOLN
50.0000 mg | Freq: Once | INTRAMUSCULAR | Status: DC
  Filled 2022-01-17: qty 1

## 2022-01-17 MED ORDER — PREDNISONE 50 MG PO TABS
50.0000 mg | ORAL_TABLET | Freq: Four times a day (QID) | ORAL | Status: DC
  Filled 2022-01-17: qty 1

## 2022-02-22 DIAGNOSIS — Z7901 Long term (current) use of anticoagulants: Secondary | ICD-10-CM | POA: Insufficient documentation

## 2022-02-28 ENCOUNTER — Ambulatory Visit: Admit: 2022-02-28 | Payer: Medicare Other

## 2022-02-28 SURGERY — ESOPHAGOGASTRODUODENOSCOPY (EGD) WITH PROPOFOL
Anesthesia: General

## 2022-03-22 ENCOUNTER — Inpatient Hospital Stay: Payer: Medicare Other

## 2022-03-22 ENCOUNTER — Inpatient Hospital Stay: Payer: Medicare Other | Attending: Oncology | Admitting: Oncology

## 2022-03-22 ENCOUNTER — Encounter: Payer: Self-pay | Admitting: Oncology

## 2022-03-22 VITALS — BP 108/60 | HR 58 | Temp 96.4°F | Resp 18 | Wt 211.3 lb

## 2022-03-22 DIAGNOSIS — I2510 Atherosclerotic heart disease of native coronary artery without angina pectoris: Secondary | ICD-10-CM | POA: Diagnosis not present

## 2022-03-22 DIAGNOSIS — R779 Abnormality of plasma protein, unspecified: Secondary | ICD-10-CM

## 2022-03-22 DIAGNOSIS — Z8051 Family history of malignant neoplasm of kidney: Secondary | ICD-10-CM | POA: Diagnosis not present

## 2022-03-22 DIAGNOSIS — Z8719 Personal history of other diseases of the digestive system: Secondary | ICD-10-CM | POA: Insufficient documentation

## 2022-03-22 DIAGNOSIS — Z807 Family history of other malignant neoplasms of lymphoid, hematopoietic and related tissues: Secondary | ICD-10-CM

## 2022-03-22 DIAGNOSIS — Z801 Family history of malignant neoplasm of trachea, bronchus and lung: Secondary | ICD-10-CM | POA: Insufficient documentation

## 2022-03-22 DIAGNOSIS — D649 Anemia, unspecified: Secondary | ICD-10-CM | POA: Diagnosis present

## 2022-03-22 DIAGNOSIS — Z7901 Long term (current) use of anticoagulants: Secondary | ICD-10-CM | POA: Diagnosis not present

## 2022-03-22 DIAGNOSIS — Z87891 Personal history of nicotine dependence: Secondary | ICD-10-CM | POA: Diagnosis not present

## 2022-03-22 DIAGNOSIS — E669 Obesity, unspecified: Secondary | ICD-10-CM | POA: Insufficient documentation

## 2022-03-22 DIAGNOSIS — Z808 Family history of malignant neoplasm of other organs or systems: Secondary | ICD-10-CM | POA: Diagnosis not present

## 2022-03-22 DIAGNOSIS — F1090 Alcohol use, unspecified, uncomplicated: Secondary | ICD-10-CM

## 2022-03-22 DIAGNOSIS — R778 Other specified abnormalities of plasma proteins: Secondary | ICD-10-CM

## 2022-03-22 DIAGNOSIS — Z87442 Personal history of urinary calculi: Secondary | ICD-10-CM | POA: Insufficient documentation

## 2022-03-22 DIAGNOSIS — Z789 Other specified health status: Secondary | ICD-10-CM

## 2022-03-22 DIAGNOSIS — Z8249 Family history of ischemic heart disease and other diseases of the circulatory system: Secondary | ICD-10-CM | POA: Insufficient documentation

## 2022-03-22 DIAGNOSIS — Z823 Family history of stroke: Secondary | ICD-10-CM | POA: Diagnosis not present

## 2022-03-22 DIAGNOSIS — Z800 Family history of malignant neoplasm of digestive organs: Secondary | ICD-10-CM | POA: Diagnosis not present

## 2022-03-22 DIAGNOSIS — Z7289 Other problems related to lifestyle: Secondary | ICD-10-CM | POA: Diagnosis not present

## 2022-03-22 DIAGNOSIS — Z85828 Personal history of other malignant neoplasm of skin: Secondary | ICD-10-CM | POA: Diagnosis not present

## 2022-03-22 DIAGNOSIS — I480 Paroxysmal atrial fibrillation: Secondary | ICD-10-CM | POA: Insufficient documentation

## 2022-03-22 DIAGNOSIS — Z79899 Other long term (current) drug therapy: Secondary | ICD-10-CM | POA: Diagnosis not present

## 2022-03-22 LAB — COMPREHENSIVE METABOLIC PANEL
ALT: 18 U/L (ref 0–44)
AST: 23 U/L (ref 15–41)
Albumin: 4 g/dL (ref 3.5–5.0)
Alkaline Phosphatase: 63 U/L (ref 38–126)
Anion gap: 8 (ref 5–15)
BUN: 24 mg/dL — ABNORMAL HIGH (ref 8–23)
CO2: 27 mmol/L (ref 22–32)
Calcium: 8.9 mg/dL (ref 8.9–10.3)
Chloride: 103 mmol/L (ref 98–111)
Creatinine, Ser: 0.88 mg/dL (ref 0.61–1.24)
GFR, Estimated: >60 mL/min (ref >60)
Glucose, Bld: 109 mg/dL — ABNORMAL HIGH (ref 70–99)
Potassium: 4.6 mmol/L (ref 3.5–5.1)
Sodium: 138 mmol/L (ref 135–145)
Total Bilirubin: 0.2 mg/dL — ABNORMAL LOW (ref 0.3–1.2)
Total Protein: 7 g/dL (ref 6.5–8.1)

## 2022-03-22 LAB — RETIC PANEL
Immature Retic Fract: 12 % (ref 2.3–15.9)
RBC.: 4.95 MIL/uL (ref 4.22–5.81)
Retic Count, Absolute: 66.8 10*3/uL (ref 19.0–186.0)
Retic Ct Pct: 1.4 % (ref 0.4–3.1)
Reticulocyte Hemoglobin: 30.8 pg (ref >27.9)

## 2022-03-22 LAB — CBC WITH DIFFERENTIAL/PLATELET
Abs Immature Granulocytes: 0.03 10*3/uL (ref 0.00–0.07)
Basophils Absolute: 0.1 10*3/uL (ref 0.0–0.1)
Basophils Relative: 1 %
Eosinophils Absolute: 0.3 10*3/uL (ref 0.0–0.5)
Eosinophils Relative: 6 %
HCT: 41.3 % (ref 39.0–52.0)
Hemoglobin: 13.2 g/dL (ref 13.0–17.0)
Immature Granulocytes: 1 %
Lymphocytes Relative: 25 %
Lymphs Abs: 1.5 10*3/uL (ref 0.7–4.0)
MCH: 26.6 pg (ref 26.0–34.0)
MCHC: 32 g/dL (ref 30.0–36.0)
MCV: 83.3 fL (ref 80.0–100.0)
Monocytes Absolute: 0.8 10*3/uL (ref 0.1–1.0)
Monocytes Relative: 13 %
Neutro Abs: 3.4 10*3/uL (ref 1.7–7.7)
Neutrophils Relative %: 54 %
Platelets: 240 10*3/uL (ref 150–400)
RBC: 4.96 MIL/uL (ref 4.22–5.81)
RDW: 14.7 % (ref 11.5–15.5)
WBC: 6.1 10*3/uL (ref 4.0–10.5)
nRBC: 0 % (ref 0.0–0.2)

## 2022-03-22 LAB — TSH: TSH: 1.649 u[IU]/mL (ref 0.350–4.500)

## 2022-03-22 LAB — LACTATE DEHYDROGENASE: LDH: 104 U/L (ref 98–192)

## 2022-03-22 LAB — VITAMIN B12: Vitamin B-12: 505 pg/mL (ref 180–914)

## 2022-03-22 LAB — FOLATE: Folate: 20.3 ng/mL (ref >5.9)

## 2022-03-22 NOTE — Progress Notes (Signed)
Patient here to establish care  

## 2022-03-22 NOTE — Progress Notes (Signed)
Hematology/Oncology Consult note [phone] [phone]         Patient Care Team: Kirk Ruths, MD as PCP - General (Internal Medicine) Minna Merritts, MD as PCP - Cardiology (Cardiology)  REFERRING PROVIDER: Kirk Ruths, MD  CHIEF COMPLAINTS/REASON FOR VISIT:  Evaluation of abnormal labs  HISTORY OF PRESENTING ILLNESS:   Brandon Henderson is a  66 y.o.  male with PMH listed below was seen in consultation at the request of  Kirk Ruths, MD  for evaluation of abnormal labs Patient has a history of chronic mild anemia. 02/22/2022, hemoglobin 12.2, MCV 84, normal platelet count and white count. Iron panel showed ferritin of 67, iron saturation 17 and TIBC 359. 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  Reviewed patient's previous blood work, patient has chronic anemia, dating back to at least 2021.  Hemoglobin 12s-13s Patient denies any unintentional weight loss, night sweats, fever. He drinks 1-2 beer, 5 days/week History of skin squamous cell carcinoma. History of angina I, coronary artery disease status post PCI/BMS.  Patient has paroxysmal atrial fibrillation patient takes apixaban.  Review of Systems  Constitutional:  Negative for appetite change, chills, fatigue, fever and unexpected weight change.  HENT:   Negative for hearing loss and voice change.   Eyes:  Negative for eye problems and icterus.  Respiratory:  Negative for chest tightness, cough and shortness of breath.   Cardiovascular:  Negative for chest pain and leg swelling.  Gastrointestinal:  Negative for abdominal distention and abdominal pain.  Endocrine: Negative for hot flashes.  Genitourinary:  Negative for difficulty urinating, dysuria and frequency.   Musculoskeletal:  Negative for arthralgias.  Skin:  Negative for itching and rash.  Neurological:  Negative for  light-headedness and numbness.  Hematological:  Negative for adenopathy. Does not bruise/bleed easily.  Psychiatric/Behavioral:  Negative for confusion.    MEDICAL HISTORY:  Past Medical History:  Diagnosis Date   Anginal pain ([place])    Arthritis    Barrett esophagus    Carotid arterial disease ([place])    a. 2011 s/p R CEA.   Coronary artery disease    a. 2000 s/p PCI/BMS x 2 of OM2 [name], [place]); b. 12/2018 MV: EF 43% (likely 2/2 GI uptake artifact No ischemia. Low risk; c. 03/2019 PCI: LM nl, LAD 81m LCX nl, OM2 90 ISR, RCA 40p, 846m3.5x15 Resolute Onyx DES), RPL2 70. d. 06/04/2019 LHC 1. severe OM2 (suspected in stent restenosis), mild to mod dz LAD & RCA, patent mRCA stent, nl LV pressure, balloon angio to OM2 90p to 40p   Depression    Diabetes mellitus without complication (HCC)    Prediabetes   Diastasis recti    GERD (gastroesophageal reflux disease)    History of hiatal hernia    History of kidney stones    History of tobacco abuse    Hyperlipidemia LDL goal <70    Hypertension    Morbid obesity ([place]   Osteoarthritis    Sleep apnea    Squamous cell carcinoma of left ear    Squamous cell carcinoma, arm, right     SURGICAL HISTORY: Past Surgical History:  Procedure Laterality Date   artoscopic rotator cuff repair     BACK SURGERY  1991   CARDIAC CATHETERIZATION     CAROTID ARTERY ANGIOPLASTY  2011   carotid endartarectomy     COLONOSCOPY WITH PROPOFOL N/A 06/12/2018   Procedure: COLONOSCOPY WITH  PROPOFOL;  Surgeon: Toledo, Benay Pike, MD;  Location: [facility];  Service: Gastroenterology;  Laterality: N/A;   CORONARY ANGIOPLASTY     CORONARY BALLOON ANGIOPLASTY N/A 06/04/2019   Procedure: CORONARY BALLOON ANGIOPLASTY;  Surgeon: Nelva Bush, MD;  Location: [place] [facility];  Service: Cardiovascular;  Laterality: N/A;   CORONARY STENT INTERVENTION N/A 04/23/2019   Procedure: CORONARY STENT INTERVENTION;  Surgeon: Nelva Bush, MD;  Location: [place] [facility];  Service: Cardiovascular;  Laterality: N/A;   ESOPHAGOGASTRODUODENOSCOPY (EGD) WITH PROPOFOL N/A 06/12/2018   Procedure: ESOPHAGOGASTRODUODENOSCOPY (EGD) WITH PROPOFOL;  Surgeon: Toledo, Benay Pike, MD;  Location: [facility] END[facility];  Service: Gastroenterology;  Laterality: N/A;   LEFT HEART CATH AND CORONARY ANGIOGRAPHY N/A 04/23/2019   Procedure: LEFT HEART CATH AND CORONARY ANGIOGRAPHY;  Surgeon: Nelva Bush, MD;  Location: [place] [facility];  Service: Cardiovascular;  Laterality: N/A;   LEFT HEART CATH AND CORONARY ANGIOGRAPHY Left 06/04/2019   Procedure: LEFT HEART CATH AND CORONARY ANGIOGRAPHY;  Surgeon: Nelva Bush, MD;  Location: [place] [facility];  Service: Cardiovascular;  Laterality: Left;   Rotator cuff  2010    SOCIAL HISTORY: Social History   Socioeconomic History   Marital status: Married    Spouse name: Not on file   Number of children: Not on file   Years of education: Not on file   Highest education level: Not on file  Occupational History   Not on file  Tobacco Use   Smoking status: Former    Packs/day: 1.50    Years: 40.00    Pack years: 60.00    Types: Cigarettes, E-cigarettes    Quit date: 10/17/2018    Years since quitting: 3.4   Smokeless tobacco: Never   Tobacco comments:    quit vaping 10/2018. quit smoking 2016  Vaping Use   Vaping Use: Former   Quit date: 10/25/2018  Substance and Sexual Activity   Alcohol use: Yes    Alcohol/week: 4.0 standard drinks    Types: 1 Glasses of wine, 3 Cans of beer per week    Comment: 7-14 cans of beer/week   Drug use: Not Currently    Types: Marijuana    Comment: in the past   Sexual activity: Not on file  Other Topics Concern   Not on file  Social History Narrative   Lives locally.  Works @ [employer].  Does not routinely exercise.   Social Determinants of Health   Financial Resource Strain: Not on file  Food Insecurity: Not on file  Transportation Needs: Not on file  Physical Activity: Not on file   Stress: Not on file  Social Connections: Not on file  Intimate Partner Violence: Not on file    FAMILY HISTORY: Family History  Problem Relation Age of Onset   Heart attack Mother    Stroke Father    Colon cancer Sister    Lung cancer Sister    Kidney cancer Brother    Non-Hodgkin's lymphoma Brother    Colon cancer Brother     ALLERGIES:  is allergic to contrast media [iodinated contrast media].  MEDICATIONS:  Current Outpatient Medications  Medication Sig Dispense Refill   apixaban (ELIQUIS) 5 MG TABS tablet Take 5 mg by mouth 2 (two) times daily.     Ascorbic Acid (VITAMIN C) 1000 MG tablet Take 1,000 mg by mouth at bedtime.      atorvastatin (LIPITOR) 40 MG tablet Take 1 tablet (40 mg total) by mouth daily. Please call to schedule  an appointment for further refills. (Patient taking differently: Take 80 mg by mouth daily. Please call to schedule an appointment for further refills.) 90 tablet 0   clopidogrel (PLAVIX) 75 MG tablet Take 1 tablet (75 mg total) by mouth daily with breakfast. (Patient taking differently: Take 75 mg by mouth at bedtime.) 90 tablet 3   Coenzyme Q10 (COQ10) 200 MG CAPS Take 200 mg by mouth at bedtime.     escitalopram (LEXAPRO) 20 MG tablet Take 20 mg by mouth daily.     Glucosamine-Chondroitin (COSAMIN DS PO) Take 2 tablets by mouth at bedtime.      isosorbide mononitrate (IMDUR) 30 MG 24 hr tablet Take 1 tablet (30 mg total) by mouth daily. (Patient taking differently: Take 30 mg by mouth at bedtime.) 90 tablet 3   magnesium oxide (MAG-OX) 400 MG tablet Take 400 mg by mouth at bedtime. 2 tabs in the morning and 1 tab at night     metoprolol tartrate (LOPRESSOR) 25 MG tablet Take 0.5 tablets (12.5 mg total) by mouth 2 (two) times daily. 90 tablet 3   Multiple Vitamin (MULTIVITAMIN WITH MINERALS) TABS tablet Take 1 tablet by mouth at bedtime.     pantoprazole (PROTONIX) 40 MG tablet Take 40 mg by mouth at bedtime.      ramipril (ALTACE) 5 MG capsule Take  1 capsule (5 mg total) by mouth daily. Please call to schedule appointment for further refills. 90 capsule 0   nitroGLYCERIN (NITROSTAT) 0.4 MG SL tablet Place 1 tablet (0.4 mg total) under the tongue every 5 (five) minutes as needed for chest pain. (Patient not taking: Reported on 03/22/2022) 90 tablet 3   No current facility-administered medications for this visit.     PHYSICAL EXAMINATION:  Vitals:   03/22/22 0926  BP: 108/60  Pulse: (!) 58  Resp: 18  Temp: (!) 96.4 F (35.8 C)   Filed Weights   03/22/22 0926  Weight: 211 lb 4.8 oz (95.8 kg)    Physical Exam Constitutional:      General: He is not in acute distress.    Appearance: He is obese.  HENT:     Head: Normocephalic and atraumatic.  Eyes:     General: No scleral icterus. Cardiovascular:     Rate and Rhythm: Normal rate and regular rhythm.     Heart sounds: Normal heart sounds.  Pulmonary:     Effort: Pulmonary effort is normal. No respiratory distress.     Breath sounds: No wheezing.  Abdominal:     General: Bowel sounds are normal. There is no distension.     Palpations: Abdomen is soft.  Musculoskeletal:        General: No deformity. Normal range of motion.     Cervical back: Normal range of motion and neck supple.  Skin:    General: Skin is warm and dry.     Findings: No erythema or rash.  Neurological:     Mental Status: He is alert and oriented to person, place, and time. Mental status is at baseline.     Cranial Nerves: No cranial nerve deficit.     Coordination: Coordination normal.  Psychiatric:        Mood and Affect: Mood normal.    LABORATORY DATA:  I have reviewed the data as listed Lab Results  Component Value Date   WBC 6.1 03/22/2022   HGB 13.2 03/22/2022   HCT 41.3 03/22/2022   MCV 83.3 03/22/2022   PLT 240 03/22/2022  Recent Labs    03/22/22 1021  NA 138  K 4.6  CL 103  CO2 27  GLUCOSE 109*  BUN 24*  CREATININE 0.88  CALCIUM 8.9  GFRNONAA >60  PROT 7.0  ALBUMIN 4.0   AST 23  ALT 18  ALKPHOS 63  BILITOT 0.2*   Iron/TIBC/Ferritin/ %Sat No results found for: IRON, TIBC, FERRITIN, IRONPCTSAT    RADIOGRAPHIC STUDIES: I have personally reviewed the radiological images as listed and agreed with the findings in the report. No results found.    ASSESSMENT & PLAN:  1. Abnormal SPEP   2. Normocytic anemia   3. Alcohol use    #Abnormal SPEP, immunofixation showed IgG monoclonal protein with lambda light chain.  Possible plasma cell disorders, i.e. MGUS Check CBC, CMP, multiple myeloma panel, light chain ratio, reticulocyte panel, NT proBNP.  #Normocytic anemia, recent iron panel is not consistent with iron deficiency.  Check vitamin B12, folate, TSH reticulocyte panel.  #Alcohol use, recommend patient to cut back on alcohol consumption. Orders Placed This Encounter  Procedures   Vitamin B12    Standing Status:   Future    Number of Occurrences:   1    Standing Expiration Date:   03/23/2023   Folate    Standing Status:   Future    Number of Occurrences:   1    Standing Expiration Date:   03/23/2023   TSH    Standing Status:   Future    Number of Occurrences:   1    Standing Expiration Date:   03/23/2023   Comprehensive metabolic panel    Standing Status:   Future    Number of Occurrences:   1    Standing Expiration Date:   03/23/2023   CBC with Differential/Platelet    Standing Status:   Future    Number of Occurrences:   1    Standing Expiration Date:   03/23/2023   Retic Panel    Standing Status:   Future    Number of Occurrences:   1    Standing Expiration Date:   03/23/2023   Multiple Myeloma Panel (SPEP&IFE w/QIG)    Standing Status:   Future    Number of Occurrences:   1    Standing Expiration Date:   03/23/2023   Kappa/lambda light chains    Standing Status:   Future    Number of Occurrences:   1    Standing Expiration Date:   03/23/2023   Lactate dehydrogenase    Standing Status:   Future    Number of Occurrences:   1    Standing  Expiration Date:   03/23/2023   IFE+PROTEIN ELECTRO, 24-HR UR    Standing Status:   Future    Standing Expiration Date:   03/23/2023   Miscellaneous [facility] test (send-out)    Standing Status:   Future    Number of Occurrences:   1    Standing Expiration Date:   03/23/2023    Order Specific Question:   Test name / description:    Answer:   NT-proBNP [facility] [id number]    All questions were answered. The patient knows to call the clinic with any problems questions or concerns.  CC Kirk Ruths, MD    Return of visit: Follow-up in 3 to 4 weeks to REVIEW results. Thank you for this kind referral and the opportunity to participate in the care of this patient. A copy of today's note is routed to referring provider  Earlie Server, MD, PhD [facility] Hematology Oncology 03/22/2022

## 2022-03-23 LAB — MISC LABCORP TEST (SEND OUT): Labcorp test code: 143000

## 2022-03-23 LAB — KAPPA/LAMBDA LIGHT CHAINS
Kappa free light chain: 20.5 mg/L — ABNORMAL HIGH (ref 3.3–19.4)
Kappa, lambda light chain ratio: 1.47 (ref 0.26–1.65)
Lambda free light chains: 13.9 mg/L (ref 5.7–26.3)

## 2022-03-27 DIAGNOSIS — D649 Anemia, unspecified: Secondary | ICD-10-CM | POA: Diagnosis not present

## 2022-03-28 ENCOUNTER — Other Ambulatory Visit: Payer: Self-pay

## 2022-03-28 DIAGNOSIS — D649 Anemia, unspecified: Secondary | ICD-10-CM

## 2022-03-28 DIAGNOSIS — R778 Other specified abnormalities of plasma proteins: Secondary | ICD-10-CM

## 2022-03-28 LAB — MULTIPLE MYELOMA PANEL, SERUM
Albumin SerPl Elph-Mcnc: 3.8 g/dL (ref 2.9–4.4)
Albumin/Glob SerPl: 1.5 (ref 0.7–1.7)
Alpha 1: 0.2 g/dL (ref 0.0–0.4)
Alpha2 Glob SerPl Elph-Mcnc: 0.8 g/dL (ref 0.4–1.0)
B-Globulin SerPl Elph-Mcnc: 1 g/dL (ref 0.7–1.3)
Gamma Glob SerPl Elph-Mcnc: 0.7 g/dL (ref 0.4–1.8)
Globulin, Total: 2.7 g/dL (ref 2.2–3.9)
IgA: 172 mg/dL (ref 61–437)
IgG (Immunoglobin G), Serum: 748 mg/dL (ref 603–1613)
IgM (Immunoglobulin M), Srm: 33 mg/dL (ref 20–172)
Total Protein ELP: 6.5 g/dL (ref 6.0–8.5)

## 2022-03-29 LAB — IFE+PROTEIN ELECTRO, 24-HR UR
% BETA, Urine: 0 %
ALPHA 1 URINE: 0 %
Albumin, U: 100 %
Alpha 2, Urine: 0 %
GAMMA GLOBULIN URINE: 0 %
Total Protein, Urine-Ur/day: 149 mg/24 hr (ref 30–150)
Total Protein, Urine: 5.1 mg/dL
Total Volume: 2925

## 2022-04-12 ENCOUNTER — Inpatient Hospital Stay (HOSPITAL_BASED_OUTPATIENT_CLINIC_OR_DEPARTMENT_OTHER): Payer: Medicare Other | Admitting: Oncology

## 2022-04-12 ENCOUNTER — Encounter: Payer: Self-pay | Admitting: Oncology

## 2022-04-12 VITALS — BP 125/70 | HR 56 | Temp 97.0°F | Resp 18 | Wt 215.5 lb

## 2022-04-12 DIAGNOSIS — D649 Anemia, unspecified: Secondary | ICD-10-CM

## 2022-04-12 DIAGNOSIS — R778 Other specified abnormalities of plasma proteins: Secondary | ICD-10-CM

## 2022-04-12 DIAGNOSIS — Z789 Other specified health status: Secondary | ICD-10-CM

## 2022-04-12 NOTE — Progress Notes (Signed)
Pt here for follow up. No new concerns voiced.   

## 2022-04-14 NOTE — Progress Notes (Signed)
Hematology/Oncology Progress note [phone] [phone]            Patient Care Team: Kirk Ruths, MD as PCP - General (Internal Medicine) Minna Merritts, MD as PCP - Cardiology (Cardiology)  REFERRING PROVIDER: Kirk Ruths, MD  CHIEF COMPLAINTS/REASON FOR VISIT:  Abnormal SPEP  HISTORY OF PRESENTING ILLNESS:   Brandon Henderson is a  66 y.o.  male with PMH listed below was seen in consultation at the request of  Kirk Ruths, MD  for evaluation of abnormal labs Patient has a history of chronic mild anemia. 02/22/2022, hemoglobin 12.2, MCV 84, normal platelet count and white count. Iron panel showed ferritin of 67, iron saturation 17 and TIBC 359. 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  Reviewed patient's previous blood work, patient has chronic anemia, dating back to at least 2021.  Hemoglobin 12s-13s Patient denies any unintentional weight loss, night sweats, fever. He drinks 1-2 beer, 5 days/week History of skin squamous cell carcinoma. History of angina I, coronary artery disease status post PCI/BMS.  Patient has paroxysmal atrial fibrillation patient takes apixaban.   INTERVAL HISTORY Brandon Henderson is a 66 y.o. male who has above history reviewed by me today presents for follow up visit  To discussed results. He has no new complaints.  Accompanied by wife.   Review of Systems  Constitutional:  Negative for appetite change, chills, fatigue, fever and unexpected weight change.  HENT:   Negative for hearing loss and voice change.   Eyes:  Negative for eye problems and icterus.  Respiratory:  Negative for chest tightness, cough and shortness of breath.   Cardiovascular:  Negative for chest pain and leg swelling.  Gastrointestinal:  Negative for abdominal distention and abdominal pain.  Endocrine: Negative for hot flashes.   Genitourinary:  Negative for difficulty urinating, dysuria and frequency.   Musculoskeletal:  Negative for arthralgias.  Skin:  Negative for itching and rash.  Neurological:  Negative for light-headedness and numbness.  Hematological:  Negative for adenopathy. Does not bruise/bleed easily.  Psychiatric/Behavioral:  Negative for confusion.     MEDICAL HISTORY:  Past Medical History:  Diagnosis Date   Anginal pain ([place])    Arthritis    Barrett esophagus    Carotid arterial disease ([place])    a. 2011 s/p R CEA.   Coronary artery disease    a. 2000 s/p PCI/BMS x 2 of OM2 [name], [place]); b. 12/2018 MV: EF 43% (likely 2/2 GI uptake artifact No ischemia. Low risk; c. 03/2019 PCI: LM nl, LAD 1m LCX nl, OM2 90 ISR, RCA 40p, 815m3.5x15 Resolute Onyx DES), RPL2 70. d. 06/04/2019 LHC 1. severe OM2 (suspected in stent restenosis), mild to mod dz LAD & RCA, patent mRCA stent, nl LV pressure, balloon angio to OM2 90p to 40p   Depression    Diabetes mellitus without complication (HCC)    Prediabetes   Diastasis recti    GERD (gastroesophageal reflux disease)    History of hiatal hernia    History of kidney stones    History of tobacco abuse    Hyperlipidemia LDL goal <70    Hypertension    Morbid obesity (HCC)    Osteoarthritis    Sleep apnea    Squamous cell carcinoma of left ear    Squamous cell carcinoma, arm, right     SURGICAL HISTORY: Past Surgical History:  Procedure Laterality Date   artoscopic rotator  cuff repair     BACK SURGERY  1991   CARDIAC CATHETERIZATION     CAROTID ARTERY ANGIOPLASTY  2011   carotid endartarectomy     COLONOSCOPY WITH PROPOFOL N/A 06/12/2018   Procedure: COLONOSCOPY WITH PROPOFOL;  Surgeon: Toledo, Benay Pike, MD;  Location: [facility];  Service: Gastroenterology;  Laterality: N/A;   CORONARY ANGIOPLASTY     CORONARY BALLOON ANGIOPLASTY N/A 06/04/2019   Procedure: CORONARY BALLOON ANGIOPLASTY;  Surgeon: Nelva Bush, MD;  Location: [place] [facility];  Service: Cardiovascular;  Laterality: N/A;   CORONARY STENT INTERVENTION N/A 04/23/2019   Procedure: CORONARY STENT INTERVENTION;  Surgeon: Nelva Bush, MD;  Location: [place] [facility];  Service: Cardiovascular;  Laterality: N/A;   ESOPHAGOGASTRODUODENOSCOPY (EGD) WITH PROPOFOL N/A 06/12/2018   Procedure: ESOPHAGOGASTRODUODENOSCOPY (EGD) WITH PROPOFOL;  Surgeon: Toledo, Benay Pike, MD;  Location: [facility];  Service: Gastroenterology;  Laterality: N/A;   LEFT HEART CATH AND CORONARY ANGIOGRAPHY N/A 04/23/2019   Procedure: LEFT HEART CATH AND CORONARY ANGIOGRAPHY;  Surgeon: Nelva Bush, MD;  Location: [place] [facility];  Service: Cardiovascular;  Laterality: N/A;   LEFT HEART CATH AND CORONARY ANGIOGRAPHY Left 06/04/2019   Procedure: LEFT HEART CATH AND CORONARY ANGIOGRAPHY;  Surgeon: Nelva Bush, MD;  Location: [place] [facility];  Service: Cardiovascular;  Laterality: Left;   Rotator cuff  2010    SOCIAL HISTORY: Social History   Socioeconomic History   Marital status: Married    Spouse name: Not on file   Number of children: Not on file   Years of education: Not on file   Highest education level: Not on file  Occupational History   Not on file  Tobacco Use   Smoking status: Former    Packs/day: 1.50    Years: 40.00    Total pack years: 60.00    Types: Cigarettes, E-cigarettes    Quit date: 10/17/2018    Years since quitting: 3.4   Smokeless tobacco: Never   Tobacco comments:    quit vaping 10/2018. quit smoking 2016  Vaping Use   Vaping Use: Former   Quit date: 10/25/2018  Substance and Sexual Activity   Alcohol use: Yes    Alcohol/week: 4.0 standard drinks of alcohol    Types: 1 Glasses of wine, 3 Cans of beer per week    Comment: 7-14 cans of beer/week   Drug use: Not Currently    Types: Marijuana    Comment: in the past   Sexual activity: Not on file  Other Topics Concern   Not on file  Social History Narrative   Lives locally.  Works @  [employer].  Does not routinely exercise.   Social Determinants of Health   Financial Resource Strain: Low Risk  (06/04/2019)   Overall Financial Resource Strain (CARDIA)    Difficulty of Paying Living Expenses: Not hard at all  Food Insecurity: No Food Insecurity (06/04/2019)   Hunger Vital Sign    Worried About Running Out of Food in the Last Year: Never true    Ran Out of Food in the Last Year: Never true  Transportation Needs: Unmet Transportation Needs (06/04/2019)   PRAPARE - Hydrologist (Medical): Yes    Lack of Transportation (Non-Medical): Yes  Physical Activity: Not on file  Stress: Not on file  Social Connections: Not on file  Intimate Partner Violence: Not At Risk (06/04/2019)   Humiliation, Afraid, Rape, and Kick questionnaire    Fear of Current or  Ex-Partner: No    Emotionally Abused: No    Physically Abused: No    Sexually Abused: No    FAMILY HISTORY: Family History  Problem Relation Age of Onset   Heart attack Mother    Stroke Father    Colon cancer Sister    Lung cancer Sister    Kidney cancer Brother    Non-Hodgkin's lymphoma Brother    Colon cancer Brother     ALLERGIES:  is allergic to contrast media [iodinated contrast media].  MEDICATIONS:  Current Outpatient Medications  Medication Sig Dispense Refill   apixaban (ELIQUIS) 5 MG TABS tablet Take 5 mg by mouth 2 (two) times daily.     Ascorbic Acid (VITAMIN C) 1000 MG tablet Take 1,000 mg by mouth at bedtime.      atorvastatin (LIPITOR) 40 MG tablet Take 1 tablet (40 mg total) by mouth daily. Please call to schedule an appointment for further refills. (Patient taking differently: Take 80 mg by mouth daily. Please call to schedule an appointment for further refills.) 90 tablet 0   clopidogrel (PLAVIX) 75 MG tablet Take 1 tablet (75 mg total) by mouth daily with breakfast. (Patient taking differently: Take 75 mg by mouth at bedtime.) 90 tablet 3   Coenzyme Q10 (COQ10) 200 MG CAPS  Take 200 mg by mouth at bedtime.     escitalopram (LEXAPRO) 20 MG tablet Take 20 mg by mouth daily.     Glucosamine-Chondroitin (COSAMIN DS PO) Take 2 tablets by mouth at bedtime.      isosorbide mononitrate (IMDUR) 30 MG 24 hr tablet Take 1 tablet (30 mg total) by mouth daily. (Patient taking differently: Take 30 mg by mouth at bedtime.) 90 tablet 3   magnesium oxide (MAG-OX) 400 MG tablet Take 400 mg by mouth at bedtime. 2 tabs in the morning and 1 tab at night     metoprolol tartrate (LOPRESSOR) 25 MG tablet Take 0.5 tablets (12.5 mg total) by mouth 2 (two) times daily. 90 tablet 3   Multiple Vitamin (MULTIVITAMIN WITH MINERALS) TABS tablet Take 1 tablet by mouth at bedtime.     pantoprazole (PROTONIX) 40 MG tablet Take 40 mg by mouth at bedtime.      ramipril (ALTACE) 5 MG capsule Take 1 capsule (5 mg total) by mouth daily. Please call to schedule appointment for further refills. 90 capsule 0   nitroGLYCERIN (NITROSTAT) 0.4 MG SL tablet Place 1 tablet (0.4 mg total) under the tongue every 5 (five) minutes as needed for chest pain. (Patient not taking: Reported on 03/22/2022) 90 tablet 3   No current facility-administered medications for this visit.     PHYSICAL EXAMINATION:  Vitals:   04/12/22 1339  BP: 125/70  Pulse: (!) 56  Resp: 18  Temp: (!) 97 F (36.1 C)   Filed Weights   04/12/22 1339  Weight: 215 lb 8 oz (97.8 kg)    Physical Exam Constitutional:      General: He is not in acute distress.    Appearance: He is obese.  HENT:     Head: Normocephalic and atraumatic.  Eyes:     General: No scleral icterus. Cardiovascular:     Rate and Rhythm: Normal rate and regular rhythm.     Heart sounds: Normal heart sounds.  Pulmonary:     Effort: Pulmonary effort is normal. No respiratory distress.     Breath sounds: No wheezing.  Abdominal:     General: Bowel sounds are normal. There is no distension.  Palpations: Abdomen is soft.  Musculoskeletal:        General: No  deformity. Normal range of motion.     Cervical back: Normal range of motion and neck supple.  Skin:    General: Skin is warm and dry.     Findings: No erythema or rash.  Neurological:     Mental Status: He is alert and oriented to person, place, and time. Mental status is at baseline.     Cranial Nerves: No cranial nerve deficit.     Coordination: Coordination normal.  Psychiatric:        Mood and Affect: Mood normal.     LABORATORY DATA:  I have reviewed the data as listed Lab Results  Component Value Date   WBC 6.1 03/22/2022   HGB 13.2 03/22/2022   HCT 41.3 03/22/2022   MCV 83.3 03/22/2022   PLT 240 03/22/2022   Recent Labs    03/22/22 1021  NA 138  K 4.6  CL 103  CO2 27  GLUCOSE 109*  BUN 24*  CREATININE 0.88  CALCIUM 8.9  GFRNONAA >60  PROT 7.0  ALBUMIN 4.0  AST 23  ALT 18  ALKPHOS 63  BILITOT 0.2*    Iron/TIBC/Ferritin/ %Sat No results found for: "IRON", "TIBC", "FERRITIN", "IRONPCTSAT"    RADIOGRAPHIC STUDIES: I have personally reviewed the radiological images as listed and agreed with the findings in the report. No results found.    ASSESSMENT & PLAN:  1. Alcohol use   2. Abnormal SPEP    #Abnormal SPEP, previous immunofixation showed IgG monoclonal protein with lambda light chain.   Labs reviewed and discussed with patient. Multiple myeloma panel showed no M protein, immunofixation pattern appears unremarkable.  Light chain ratio is normal with slightly increased kappa free light chain.  24-hour UPEP is negative for M protein. I recommend observation and repeat protein after pheresis in the future.     #Alcohol use, recommend patient to cut back on alcohol consumption. Orders Placed This Encounter  Procedures   CBC with Differential/Platelet    Standing Status:   Future    Standing Expiration Date:   04/13/2023   Comprehensive metabolic panel    Standing Status:   Future    Standing Expiration Date:   04/13/2023   Kappa/lambda light  chains    Standing Status:   Future    Standing Expiration Date:   04/13/2023   Multiple Myeloma Panel (SPEP&IFE w/QIG)    Standing Status:   Future    Standing Expiration Date:   04/13/2023    All questions were answered. The patient knows to call the clinic with any problems questions or concerns.  CC Kirk Ruths, MD    Return of visit: Follow-up in 6 months  Earlie Server, MD, PhD [facility] Hematology Oncology 04/14/2022

## 2022-07-08 ENCOUNTER — Encounter: Payer: Self-pay | Admitting: Emergency Medicine

## 2022-07-08 ENCOUNTER — Emergency Department: Payer: Medicare Other

## 2022-07-08 ENCOUNTER — Emergency Department
Admission: EM | Admit: 2022-07-08 | Discharge: 2022-07-08 | Disposition: A | Discharge status: Home / Self Care | Payer: Medicare Other | Attending: Emergency Medicine | Admitting: Emergency Medicine

## 2022-07-08 ENCOUNTER — Other Ambulatory Visit: Payer: Self-pay

## 2022-07-08 DIAGNOSIS — E119 Type 2 diabetes mellitus without complications: Secondary | ICD-10-CM | POA: Insufficient documentation

## 2022-07-08 DIAGNOSIS — L03113 Cellulitis of right upper limb: Secondary | ICD-10-CM | POA: Insufficient documentation

## 2022-07-08 DIAGNOSIS — I10 Essential (primary) hypertension: Secondary | ICD-10-CM | POA: Insufficient documentation

## 2022-07-08 DIAGNOSIS — F419 Anxiety disorder, unspecified: Secondary | ICD-10-CM | POA: Diagnosis not present

## 2022-07-08 DIAGNOSIS — R519 Headache, unspecified: Secondary | ICD-10-CM | POA: Diagnosis not present

## 2022-07-08 DIAGNOSIS — R110 Nausea: Secondary | ICD-10-CM | POA: Insufficient documentation

## 2022-07-08 DIAGNOSIS — I2510 Atherosclerotic heart disease of native coronary artery without angina pectoris: Secondary | ICD-10-CM | POA: Insufficient documentation

## 2022-07-08 DIAGNOSIS — Z20822 Contact with and (suspected) exposure to covid-19: Secondary | ICD-10-CM | POA: Insufficient documentation

## 2022-07-08 DIAGNOSIS — R509 Fever, unspecified: Secondary | ICD-10-CM | POA: Diagnosis present

## 2022-07-08 LAB — CBC WITH DIFFERENTIAL/PLATELET
Abs Immature Granulocytes: 0.11 10*3/uL — ABNORMAL HIGH (ref 0.00–0.07)
Basophils Absolute: 0.1 10*3/uL (ref 0.0–0.1)
Basophils Relative: 0 %
Eosinophils Absolute: 0.1 10*3/uL (ref 0.0–0.5)
Eosinophils Relative: 0 %
HCT: 41.8 % (ref 39.0–52.0)
Hemoglobin: 13.3 g/dL (ref 13.0–17.0)
Immature Granulocytes: 1 %
Lymphocytes Relative: 11 %
Lymphs Abs: 2 10*3/uL (ref 0.7–4.0)
MCH: 26.4 pg (ref 26.0–34.0)
MCHC: 31.8 g/dL (ref 30.0–36.0)
MCV: 82.9 fL (ref 80.0–100.0)
Monocytes Absolute: 2.1 10*3/uL — ABNORMAL HIGH (ref 0.1–1.0)
Monocytes Relative: 12 %
Neutro Abs: 13.4 10*3/uL — ABNORMAL HIGH (ref 1.7–7.7)
Neutrophils Relative %: 76 %
Platelets: 237 10*3/uL (ref 150–400)
RBC: 5.04 MIL/uL (ref 4.22–5.81)
RDW: 15.1 % (ref 11.5–15.5)
WBC: 17.8 10*3/uL — ABNORMAL HIGH (ref 4.0–10.5)
nRBC: 0 % (ref 0.0–0.2)

## 2022-07-08 LAB — COMPREHENSIVE METABOLIC PANEL
ALT: 21 U/L (ref 0–44)
AST: 21 U/L (ref 15–41)
Albumin: 4.5 g/dL (ref 3.5–5.0)
Alkaline Phosphatase: 53 U/L (ref 38–126)
Anion gap: 10 (ref 5–15)
BUN: 21 mg/dL (ref 8–23)
CO2: 26 mmol/L (ref 22–32)
Calcium: 9.3 mg/dL (ref 8.9–10.3)
Chloride: 100 mmol/L (ref 98–111)
Creatinine, Ser: 1.09 mg/dL (ref 0.61–1.24)
GFR, Estimated: >60 mL/min (ref >60)
Glucose, Bld: 122 mg/dL — ABNORMAL HIGH (ref 70–99)
Potassium: 4.6 mmol/L (ref 3.5–5.1)
Sodium: 136 mmol/L (ref 135–145)
Total Bilirubin: 1.2 mg/dL (ref 0.3–1.2)
Total Protein: 7.6 g/dL (ref 6.5–8.1)

## 2022-07-08 LAB — LACTIC ACID, PLASMA: Lactic Acid, Venous: 1.2 mmol/L (ref 0.5–1.9)

## 2022-07-08 LAB — SARS CORONAVIRUS 2 BY RT PCR: SARS Coronavirus 2 by RT PCR: NEGATIVE

## 2022-07-08 MED ORDER — ONDANSETRON 4 MG PO TBDP: 4.0000 mg | ORAL_TABLET | Freq: Three times a day (TID) | ORAL | 0 refills | Status: DC | PRN

## 2022-07-08 MED ORDER — CLINDAMYCIN HCL 300 MG PO CAPS: 300.0000 mg | ORAL_CAPSULE | Freq: Three times a day (TID) | ORAL | 0 refills | Status: AC

## 2022-07-08 MED ORDER — OXYCODONE-ACETAMINOPHEN 7.5-325 MG PO TABS: 1.0000 | ORAL_TABLET | Freq: Four times a day (QID) | ORAL | 0 refills | Status: AC | PRN

## 2022-07-08 MED ORDER — OXYCODONE-ACETAMINOPHEN 5-325 MG PO TABS
1.0000 | ORAL_TABLET | Freq: Once | ORAL | Status: AC
  Administered 2022-07-08: 1 via ORAL
  Filled 2022-07-08: qty 1

## 2022-07-08 MED ORDER — SODIUM CHLORIDE 0.9 % IV SOLN
1.0000 g | Freq: Once | INTRAVENOUS | Status: AC
  Administered 2022-07-08: 1 g via INTRAVENOUS
  Filled 2022-07-08: qty 10

## 2022-07-08 MED ORDER — ONDANSETRON HCL 4 MG/2ML IJ SOLN
4.0000 mg | Freq: Once | INTRAMUSCULAR | Status: AC
  Administered 2022-07-08: 4 mg via INTRAVENOUS
  Filled 2022-07-08: qty 2

## 2022-07-08 NOTE — ED Triage Notes (Signed)
C/O right arm injury on Monday, area reddened on forearm.  Hit with a buckle.  States noticed a fever last night, felt poorly all day yesterday.  C/O headache, body aches. Tylenol last taken last night.  Also nausea.  Redness to right fore arm with streaking up arm.

## 2022-07-08 NOTE — Discharge Instructions (Addendum)
Begin taking antibiotics that were sent to the pharmacy this is a different antibiotic than what you were given in the emergency department and can be started as soon as you get it filled.  Also Zofran was sent if needed for nausea and oxycodone/acetaminophen every 6 hours as needed for pain.  You may use warm moist compresses to the area frequently. Return to the emergency department over the weekend if there is any worsening of your symptoms and also if the redness is going past the areas that we marked today.

## 2022-07-08 NOTE — ED Provider Notes (Signed)
[facility] Provider Note    Event Date/Time   First MD Initiated Contact with Patient 07/08/22 1201     (approximate)   History   Arm Injury   HPI  Brandon Henderson is a 66 y.o. male   presents to the ED with complaint of red forearm that began last evening and patient also noticed a fever.  Patient endorses headache, body aches, nausea.  Patient took Tylenol last evening.  Patient recalls an injury to his forearm that occurred the beginning of the week where a belt buckle hit his forearm.  He denies any skin tear or bleeding in that area.  Patient has history of diabetes type 2, coronary artery disease, coronary angioplasty, hypertension, osteoarthritis, Barrett's esophagus and cardiac catheterization.     Physical Exam   Triage Vital Signs: ED Triage Vitals  Enc Vitals Group     BP 07/08/22 1117 (!) 117/59     Pulse Rate 07/08/22 1117 62     Resp 07/08/22 1117 16     Temp 07/08/22 1117 98.5 F (36.9 C)     Temp Source 07/08/22 1117 Oral     SpO2 07/08/22 1117 96 %     Weight 07/08/22 1118 215 lb 9.8 oz (97.8 kg)     Height 07/08/22 1118 5' 7.75" (1.721 m)     Head Circumference --      Peak Flow --      Pain Score 07/08/22 1118 4     Pain Loc --      Pain Edu? --      Excl. in [place]? --     Most recent vital signs: Vitals:   07/08/22 1225 07/08/22 1411  BP: 120/60 122/62  Pulse: 68 70  Resp: 16 16  Temp:  98 F (36.7 C)  SpO2: 96% 96%     General: Awake, no distress.  CV:  Good peripheral perfusion.  Heart regular rate and rhythm. Resp:  Normal effort.  Lungs are clear bilaterally. Abd:  No distention.  Other:  Right forearm starting at the wrist area dorsal aspect is erythematous and warm to palpation.  No open wounds are noted.  No drainage.  There are 2 small linear streaks on the volar surface noted however the majority is on the forearm.  No abscess formation is appreciated.  Radial pulse present.  Motor or sensory function  intact.   ED Results / Procedures / Treatments   Labs (all labs ordered are listed, but only abnormal results are displayed) Labs Reviewed  COMPREHENSIVE METABOLIC PANEL - Abnormal; Notable for the following components:      Result Value   Glucose, Bld 122 (*)    All other components within normal limits  CBC WITH DIFFERENTIAL/PLATELET - Abnormal; Notable for the following components:   WBC 17.8 (*)    Neutro Abs 13.4 (*)    Monocytes Absolute 2.1 (*)    Abs Immature Granulocytes 0.11 (*)    All other components within normal limits  SARS CORONAVIRUS 2 BY RT PCR  LACTIC ACID, PLASMA  URINALYSIS, ROUTINE W REFLEX MICROSCOPIC     RADIOLOGY Right forearm images were reviewed by myself independent of the radiologist and was negative for fracture.  Radiology report is negative.    PROCEDURES:  Critical Care performed:   Procedures   MEDICATIONS ORDERED IN ED: Medications  oxyCODONE-acetaminophen (PERCOCET/ROXICET) 5-325 MG per tablet 1 tablet (1 tablet Oral Given 07/08/22 1249)  cefTRIAXone (ROCEPHIN) 1 g in sodium  chloride 0.9 % 100 mL IVPB (0 g Intravenous Stopped 07/08/22 1411)  ondansetron (ZOFRAN) injection 4 mg (4 mg Intravenous Given 07/08/22 1249)     IMPRESSION / MDM / ASSESSMENT AND PLAN / ED COURSE  I reviewed the triage vital signs and the nursing notes.   Differential diagnosis includes, but is not limited to, cellulitis, contusion right forearm, fracture, COVID, sepsis, pneumonia.  66 year old male presents to the ED with complaint of right forearm redness that began last evening.  Patient recalls hitting himself with a belt buckle 4 days ago but denies any skin disruption.  Patient's initial complaints included headache, body aches, nausea and fever.  Triage staff initiated COVID, chest x-ray, lab work.  Patient was anxious that there may be a small fracture to his forearm and an x-ray was negative.  She was made aware that his COVID test was negative and  reassured when lactic acid was 1.2, CMP was unremarkable, WBC was 17.8 and chest x-ray was negative.  While in the emergency department patient was given Zofran IV, Rocephin 1 g IV and Percocet p.o.  Patient improved with nausea and pain was decreasing but not completely gone.  I discussed with him and his wife the need to continue with antibiotics.  He was given strict return precautions over the weekend should he develop any continued fever, chills, vomiting or inability to take the antibiotic he is to return to the emergency department at which time most likely he will be admitted.  A  skin marker was used to outline the margins of the erythema for wife to observe patient was discharged with a prescription for oxycodone, clindamycin and Zofran.  Patient was afebrile in triage and also at the time of discharge.      Patient's presentation is most consistent with acute complicated illness / injury requiring diagnostic workup.  FINAL CLINICAL IMPRESSION(S) / ED DIAGNOSES   Final diagnoses:  Cellulitis of right forearm     Rx / DC Orders   ED Discharge Orders          Ordered    clindamycin (CLEOCIN) 300 MG capsule  3 times daily        07/08/22 1358    ondansetron (ZOFRAN-ODT) 4 MG disintegrating tablet  Every 8 hours PRN        07/08/22 1358    oxyCODONE-acetaminophen (PERCOCET) 7.5-325 MG tablet  Every 6 hours PRN        07/08/22 1358             Note:  This document was prepared using Dragon voice recognition software and may include unintentional dictation errors.   [name], PA-C 07/08/22 1519    Delman Kitten, MD 07/08/22 [phone]

## 2022-10-05 ENCOUNTER — Other Ambulatory Visit: Payer: Medicare Other

## 2022-10-06 ENCOUNTER — Inpatient Hospital Stay: Payer: Medicare Other | Attending: Oncology

## 2022-10-06 DIAGNOSIS — D472 Monoclonal gammopathy: Secondary | ICD-10-CM | POA: Diagnosis not present

## 2022-10-06 DIAGNOSIS — Z87891 Personal history of nicotine dependence: Secondary | ICD-10-CM | POA: Insufficient documentation

## 2022-10-06 DIAGNOSIS — Z789 Other specified health status: Secondary | ICD-10-CM

## 2022-10-06 LAB — COMPREHENSIVE METABOLIC PANEL
ALT: 20 U/L (ref 0–44)
AST: 24 U/L (ref 15–41)
Albumin: 4.1 g/dL (ref 3.5–5.0)
Alkaline Phosphatase: 65 U/L (ref 38–126)
Anion gap: 9 (ref 5–15)
BUN: 26 mg/dL — ABNORMAL HIGH (ref 8–23)
CO2: 26 mmol/L (ref 22–32)
Calcium: 8.9 mg/dL (ref 8.9–10.3)
Chloride: 102 mmol/L (ref 98–111)
Creatinine, Ser: 0.93 mg/dL (ref 0.61–1.24)
GFR, Estimated: >60 mL/min (ref >60)
Glucose, Bld: 125 mg/dL — ABNORMAL HIGH (ref 70–99)
Potassium: 4.6 mmol/L (ref 3.5–5.1)
Sodium: 137 mmol/L (ref 135–145)
Total Bilirubin: 0.6 mg/dL (ref 0.3–1.2)
Total Protein: 7 g/dL (ref 6.5–8.1)

## 2022-10-06 LAB — CBC WITH DIFFERENTIAL/PLATELET
Abs Immature Granulocytes: 0.03 10*3/uL (ref 0.00–0.07)
Basophils Absolute: 0.1 10*3/uL (ref 0.0–0.1)
Basophils Relative: 1 %
Eosinophils Absolute: 0.5 10*3/uL (ref 0.0–0.5)
Eosinophils Relative: 7 %
HCT: 41.1 % (ref 39.0–52.0)
Hemoglobin: 13.3 g/dL (ref 13.0–17.0)
Immature Granulocytes: 0 %
Lymphocytes Relative: 25 %
Lymphs Abs: 1.8 10*3/uL (ref 0.7–4.0)
MCH: 26.9 pg (ref 26.0–34.0)
MCHC: 32.4 g/dL (ref 30.0–36.0)
MCV: 83 fL (ref 80.0–100.0)
Monocytes Absolute: 0.9 10*3/uL (ref 0.1–1.0)
Monocytes Relative: 12 %
Neutro Abs: 3.8 10*3/uL (ref 1.7–7.7)
Neutrophils Relative %: 55 %
Platelets: 243 10*3/uL (ref 150–400)
RBC: 4.95 MIL/uL (ref 4.22–5.81)
RDW: 14.7 % (ref 11.5–15.5)
WBC: 7 10*3/uL (ref 4.0–10.5)
nRBC: 0 % (ref 0.0–0.2)

## 2022-10-07 LAB — KAPPA/LAMBDA LIGHT CHAINS
Kappa free light chain: 26 mg/L — ABNORMAL HIGH (ref 3.3–19.4)
Kappa, lambda light chain ratio: 1.67 — ABNORMAL HIGH (ref 0.26–1.65)
Lambda free light chains: 15.6 mg/L (ref 5.7–26.3)

## 2022-10-12 ENCOUNTER — Ambulatory Visit: Payer: Medicare Other | Admitting: Oncology

## 2022-10-12 LAB — MULTIPLE MYELOMA PANEL, SERUM
Albumin SerPl Elph-Mcnc: 3.4 g/dL (ref 2.9–4.4)
Albumin/Glob SerPl: 1.3 (ref 0.7–1.7)
Alpha 1: 0.2 g/dL (ref 0.0–0.4)
Alpha2 Glob SerPl Elph-Mcnc: 0.8 g/dL (ref 0.4–1.0)
B-Globulin SerPl Elph-Mcnc: 1 g/dL (ref 0.7–1.3)
Gamma Glob SerPl Elph-Mcnc: 0.7 g/dL (ref 0.4–1.8)
Globulin, Total: 2.8 g/dL (ref 2.2–3.9)
IgA: 192 mg/dL (ref 61–437)
IgG (Immunoglobin G), Serum: 733 mg/dL (ref 603–1613)
IgM (Immunoglobulin M), Srm: 36 mg/dL (ref 20–172)
Total Protein ELP: 6.2 g/dL (ref 6.0–8.5)

## 2022-10-14 ENCOUNTER — Encounter: Payer: Self-pay | Admitting: Oncology

## 2022-10-14 ENCOUNTER — Inpatient Hospital Stay (HOSPITAL_BASED_OUTPATIENT_CLINIC_OR_DEPARTMENT_OTHER): Payer: Medicare Other | Admitting: Oncology

## 2022-10-14 VITALS — BP 110/73 | HR 60 | Temp 97.2°F | Resp 18 | Wt 234.5 lb

## 2022-10-14 DIAGNOSIS — D472 Monoclonal gammopathy: Secondary | ICD-10-CM | POA: Diagnosis not present

## 2022-10-14 NOTE — Assessment & Plan Note (Signed)
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  Immunofixation is positive.'  Light chain ratio is normal with slightly increased kappa free light chain and ratio.Marland Kitchen  Previous 24-hour UPEP is negative for M protein. Labs are reviewed and discussed with patient. Continue observation and repeat labs in 6 months.

## 2022-10-14 NOTE — Progress Notes (Signed)
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  Immunofixation is positive.'  Light chain ratio is normal with slightly increased kappa free light chain and ratio.Marland Kitchen  Previous 24-hour UPEP is negative for M protein. Labs are reviewed and discussed with patient. Continue observation and repeat labs in 6 months.   Orders Placed This Encounter  Procedures   CBC with Differential/Platelet    Standing Status:   Future    Standing Expiration Date:   10/15/2023   Comprehensive metabolic panel    Standing Status:   Future    Standing Expiration Date:   10/14/2023   Kappa/lambda light chains    Standing Status:   Future    Standing Expiration Date:   10/14/2023   Multiple Myeloma Panel (SPEP&IFE w/QIG)    Standing Status:   Future    Standing Expiration Date:   10/14/2023   Follow-up in 6 months All questions were answered. The patient knows to call the clinic with any problems, questions or concerns.  Earlie Server, MD, PhD [facility] Hematology Oncology 10/14/2022    HISTORY OF PRESENTING ILLNESS:   Brandon Henderson is a  66 y.o.  male with PMH listed below was seen in consultation at the request of  Kirk Ruths, MD  for evaluation of abnormal labs Patient has a history of chronic mild anemia. 02/22/2022, hemoglobin 12.2, MCV 84, normal platelet count and white count. Iron panel showed ferritin of 67, iron saturation 17 and TIBC 359. 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  Reviewed patient's previous blood work, patient has  chronic anemia, dating back to at least 2021.  Hemoglobin 12s-13s Patient denies any unintentional weight loss, night sweats, fever. He drinks 1-2 beer, 5 days/week History of skin squamous cell carcinoma. History of angina I, coronary artery disease status post PCI/BMS.  Patient has paroxysmal atrial fibrillation patient takes apixaban.   INTERVAL HISTORY Brandon Henderson is a 66 y.o. male who has above history reviewed by me today presents for follow up visit  To discussed results. He has no new complaints.  Accompanied by wife.   Review of Systems  Constitutional:  Negative for appetite change, chills, fatigue, fever and unexpected weight change.  HENT:   Negative for hearing loss and voice change.   Eyes:  Negative for eye problems and icterus.  Respiratory:  Negative for chest tightness, cough and shortness of breath.   Cardiovascular:  Negative for chest pain and leg swelling.  Gastrointestinal:  Negative for abdominal distention and abdominal pain.  Endocrine: Negative for hot flashes.  Genitourinary:  Negative for difficulty urinating, dysuria and frequency.   Musculoskeletal:  Negative for arthralgias.  Skin:  Negative for itching and rash.  Neurological:  Negative for light-headedness and numbness.  Hematological:  Negative for adenopathy. Does not bruise/bleed easily.  Psychiatric/Behavioral:  Negative for confusion.     MEDICAL HISTORY:  Past Medical History:  Diagnosis Date   Anginal pain ([place])    Arthritis    Barrett esophagus    Carotid arterial disease ([place])    a. 2011 s/p R CEA.  Coronary artery disease    a. 2000 s/p PCI/BMS x 2 of OM2 [name], [place]); b. 12/2018 MV: EF 43% (likely 2/2 GI uptake artifact No ischemia. Low risk; c. 03/2019 PCI: LM nl, LAD 63m LCX nl, OM2 90 ISR, RCA 40p, 837m3.5x15 Resolute Onyx DES), RPL2 70. d. 06/04/2019 LHC 1. severe OM2 (suspected in stent restenosis), mild to mod dz LAD & RCA, patent mRCA stent, nl LV pressure, balloon  angio to OM2 90p to 40p   Depression    Diabetes mellitus without complication (HCC)    Prediabetes   Diastasis recti    GERD (gastroesophageal reflux disease)    History of hiatal hernia    History of kidney stones    History of tobacco abuse    Hyperlipidemia LDL goal <70    Hypertension    [place]   Osteoarthritis    Sleep apnea    Squamous cell carcinoma of left ear    Squamous cell carcinoma, arm, right     SURGICAL HISTORY: Past Surgical History:  Procedure Laterality Date   artoscopic rotator cuff repair     BACK SURGERY  1991   CARDIAC CATHETERIZATION     CAROTID ARTERY ANGIOPLASTY  2011   carotid endartarectomy     COLONOSCOPY WITH PROPOFOL N/A 06/12/2018   Procedure: COLONOSCOPY WITH PROPOFOL;  Surgeon: Toledo, TeBenay PikeMD;  Location: [facility] ENDOSCOPY;  Service: Gastroenterology;  Laterality: N/A;   CORONARY ANGIOPLASTY     CORONARY BALLOON ANGIOPLASTY N/A 06/04/2019   Procedure: CORONARY BALLOON ANGIOPLASTY;  Surgeon: EnNelva BushMD;  [facility];  Service: Cardiovascular;  Laterality: N/A;   CORONARY STENT INTERVENTION N/A 04/23/2019   Procedure: CORONARY STENT INTERVENTION;  Surgeon: EnNelva BushMD;  [facility];  Service: Cardiovascular;  Laterality: N/A;   ESOPHAGOGASTRODUODENOSCOPY (EGD) WITH PROPOFOL N/A 06/12/2018   Procedure: ESOPHAGOGASTRODUODENOSCOPY (EGD) WITH PROPOFOL;  Surgeon: Toledo, TeBenay PikeMD;  Location: [facility] ENDOSCOPY;  Service: Gastroenterology;  Laterality: N/A;   LEFT HEART CATH AND CORONARY ANGIOGRAPHY N/A 04/23/2019   Procedure: LEFT HEART CATH AND CORONARY ANGIOGRAPHY;  Surgeon: EnNelva BushMD;  [place] [facility];  Service: Cardiovascular;  Laterality: N/A;   LEFT HEART CATH AND CORONARY ANGIOGRAPHY Left 06/04/2019   Procedure: LEFT HEART CATH AND CORONARY ANGIOGRAPHY;  Surgeon: EnNelva BushMD;  [place];  Service: Cardiovascular;   Laterality: Left;   Rotator cuff  2010    SOCIAL HISTORY: Social History   Socioeconomic History   Marital status: Married    Spouse name: Not on file   Number of children: Not on file   Years of education: Not on file   Highest education level: Not on file  Occupational History   Not on file  Tobacco Use   Smoking status: Former    Packs/day: 1.50    Years: 40.00    Total pack years: 60.00    Types: Cigarettes, E-cigarettes    Quit date: 10/17/2018    Years since quitting: 3.9   Smokeless tobacco: Never   Tobacco comments:    quit vaping 10/2018. quit smoking 2016  Vaping Use   Vaping Use: Former   Quit date: 10/25/2018  Substance and Sexual Activity   Alcohol use: Yes    Alcohol/week: 4.0 standard drinks of alcohol    Types: 1 Glasses of wine, 3 Cans of beer per week    Comment: 7-14 cans of beer/week   Drug use: Not  Currently    Types: Marijuana    Comment: in the past   Sexual activity: Not Currently  Other Topics Concern   Not on file  Social History Narrative   Lives locally.  Works @ [employer].  Does not routinely exercise.   Social Determinants of Health   Financial Resource Strain: Low Risk  (06/04/2019)   Overall Financial Resource Strain (CARDIA)    Difficulty of Paying Living Expenses: Not hard at all  Food Insecurity: No Food Insecurity (06/04/2019)   Hunger Vital Sign    Worried About Running Out of Food in the Last Year: Never true    Ran Out of Food in the Last Year: Never true  Transportation Needs: Unmet Transportation Needs (06/04/2019)   PRAPARE - Hydrologist (Medical): Yes    Lack of Transportation (Non-Medical): Yes  Physical Activity: Not on file  Stress: Not on file  Social Connections: Not on file  Intimate Partner Violence: Not At Risk (06/04/2019)   Humiliation, Afraid, Rape, and Kick questionnaire    Fear of Current or Ex-Partner: No    Emotionally Abused: No    Physically Abused: No    Sexually Abused: No     FAMILY HISTORY: Family History  Problem Relation Age of Onset   Heart attack Mother    Stroke Father    Colon cancer Sister    Lung cancer Sister    Kidney cancer Brother    Non-Hodgkin's lymphoma Brother    Colon cancer Brother     ALLERGIES:  is allergic to contrast media [iodinated contrast media].  MEDICATIONS:  Current Outpatient Medications  Medication Sig Dispense Refill   apixaban (ELIQUIS) 5 MG TABS tablet Take 5 mg by mouth 2 (two) times daily.     Ascorbic Acid (VITAMIN C) 1000 MG tablet Take 1,000 mg by mouth at bedtime.      atorvastatin (LIPITOR) 40 MG tablet Take 1 tablet (40 mg total) by mouth daily. Please call to schedule an appointment for further refills. (Patient taking differently: Take 80 mg by mouth daily. Please call to schedule an appointment for further refills.) 90 tablet 0   clopidogrel (PLAVIX) 75 MG tablet Take 1 tablet (75 mg total) by mouth daily with breakfast. (Patient taking differently: Take 75 mg by mouth at bedtime.) 90 tablet 3   Coenzyme Q10 (COQ10) 200 MG CAPS Take 200 mg by mouth at bedtime.     escitalopram (LEXAPRO) 20 MG tablet Take 20 mg by mouth daily.     Glucosamine-Chondroitin (COSAMIN DS PO) Take 2 tablets by mouth at bedtime.      isosorbide mononitrate (IMDUR) 30 MG 24 hr tablet Take 1 tablet (30 mg total) by mouth daily. (Patient taking differently: Take 30 mg by mouth at bedtime.) 90 tablet 3   magnesium oxide (MAG-OX) 400 MG tablet Take 400 mg by mouth at bedtime. 2 tabs in the morning and 1 tab at night     metoprolol tartrate (LOPRESSOR) 25 MG tablet Take 0.5 tablets (12.5 mg total) by mouth 2 (two) times daily. 90 tablet 3   Multiple Vitamin (MULTIVITAMIN WITH MINERALS) TABS tablet Take 1 tablet by mouth at bedtime.     pantoprazole (PROTONIX) 40 MG tablet Take 40 mg by mouth at bedtime.      ramipril (ALTACE) 5 MG capsule Take 1 capsule (5 mg total) by mouth daily. Please call to schedule appointment for further refills.  90 capsule 0   nitroGLYCERIN (NITROSTAT) 0.4 MG  SL tablet Place 1 tablet (0.4 mg total) under the tongue every 5 (five) minutes as needed for chest pain. (Patient not taking: Reported on 03/22/2022) 90 tablet 3   ondansetron (ZOFRAN-ODT) 4 MG disintegrating tablet Take 1 tablet (4 mg total) by mouth every 8 (eight) hours as needed for nausea or vomiting. (Patient not taking: Reported on 10/14/2022) 15 tablet 0   oxyCODONE-acetaminophen (PERCOCET) 7.5-325 MG tablet Take 1 tablet by mouth every 6 (six) hours as needed for severe pain. (Patient not taking: Reported on 10/14/2022) 20 tablet 0   No current facility-administered medications for this visit.     PHYSICAL EXAMINATION:  Vitals:   10/14/22 1203  BP: 110/73  Pulse: 60  Resp: 18  Temp: (!) 97.2 F (36.2 C)   Filed Weights   10/14/22 1203  Weight: 234 lb 8 oz (106.4 kg)    Physical Exam Constitutional:      General: He is not in acute distress.    Appearance: He is obese.  HENT:     Head: Normocephalic and atraumatic.  Eyes:     General: No scleral icterus. Cardiovascular:     Rate and Rhythm: Normal rate.  Pulmonary:     Effort: Pulmonary effort is normal. No respiratory distress.  Abdominal:     General: Bowel sounds are normal. There is no distension.     Palpations: Abdomen is soft.  Musculoskeletal:        General: No deformity. Normal range of motion.     Cervical back: Normal range of motion.  Skin:    Findings: No erythema or rash.  Neurological:     Mental Status: He is alert and oriented to person, place, and time. Mental status is at baseline.  Psychiatric:        Mood and Affect: Mood normal.     LABORATORY DATA:  I have reviewed the data as listed    Latest Ref Rng & Units 10/06/2022    9:53 AM 07/08/2022   11:21 AM 03/22/2022   10:21 AM  CBC  WBC 4.0 - 10.5 K/uL 7.0  17.8  6.1   Hemoglobin 13.0 - 17.0 g/dL 13.3  13.3  13.2   Hematocrit 39.0 - 52.0 % 41.1  41.8  41.3   Platelets 150 - 400 K/uL  243  237  240       Latest Ref Rng & Units 10/06/2022    9:53 AM 07/08/2022   11:21 AM 03/22/2022   10:21 AM  CMP  Glucose 70 - 99 mg/dL 125  122  109   BUN 8 - 23 mg/dL _0 Creatinine 0.61 - 1.24 mg/dL 0.93  1.09  0.88   Sodium 135 - 145 mmol/L 137  136  138   Potassium 3.5 - 5.1 mmol/L 4.6  4.6  4.6   Chloride 98 - 111 mmol/L 102  100  103   CO2 22 - 32 mmol/L _1 Calcium 8.9 - 10.3 mg/dL 8.9  9.3  8.9   Total Protein 6.5 - 8.1 g/dL 7.0  7.6  7.0   Total Bilirubin 0.3 - 1.2 mg/dL 0.6  1.2  0.2   Alkaline Phos 38 - 126 U/L 65  53  63   AST 15 - 41 U/L _2 ALT 0 - 44 U/L _3 RADIOGRAPHIC STUDIES: I have personally reviewed the radiological  images as listed and agreed with the findings in the report. No results found.

## 2022-11-15 ENCOUNTER — Other Ambulatory Visit: Payer: Self-pay

## 2022-11-15 DIAGNOSIS — Z87891 Personal history of nicotine dependence: Secondary | ICD-10-CM

## 2022-11-22 ENCOUNTER — Ambulatory Visit
Admission: RE | Admit: 2022-11-22 | Discharge: 2022-11-22 | Disposition: A | Discharge status: Home / Self Care | Payer: Medicare Other | Source: Ambulatory Visit | Attending: Internal Medicine | Admitting: Internal Medicine

## 2022-11-22 DIAGNOSIS — Z87891 Personal history of nicotine dependence: Secondary | ICD-10-CM | POA: Insufficient documentation

## 2023-04-14 ENCOUNTER — Inpatient Hospital Stay: Payer: Medicare Other | Attending: Oncology

## 2023-04-14 DIAGNOSIS — Z87891 Personal history of nicotine dependence: Secondary | ICD-10-CM | POA: Diagnosis not present

## 2023-04-14 DIAGNOSIS — D472 Monoclonal gammopathy: Secondary | ICD-10-CM | POA: Diagnosis present

## 2023-04-14 DIAGNOSIS — Z7901 Long term (current) use of anticoagulants: Secondary | ICD-10-CM | POA: Diagnosis not present

## 2023-04-14 DIAGNOSIS — Z79899 Other long term (current) drug therapy: Secondary | ICD-10-CM | POA: Insufficient documentation

## 2023-04-14 LAB — COMPREHENSIVE METABOLIC PANEL
ALT: 23 U/L (ref 0–44)
AST: 24 U/L (ref 15–41)
Albumin: 4.1 g/dL (ref 3.5–5.0)
Alkaline Phosphatase: 88 U/L (ref 38–126)
Anion gap: 10 (ref 5–15)
BUN: 20 mg/dL (ref 8–23)
CO2: 24 mmol/L (ref 22–32)
Calcium: 8.6 mg/dL — ABNORMAL LOW (ref 8.9–10.3)
Chloride: 102 mmol/L (ref 98–111)
Creatinine, Ser: 0.95 mg/dL (ref 0.61–1.24)
GFR, Estimated: >60 mL/min (ref >60)
Glucose, Bld: 100 mg/dL — ABNORMAL HIGH (ref 70–99)
Potassium: 4.3 mmol/L (ref 3.5–5.1)
Sodium: 136 mmol/L (ref 135–145)
Total Bilirubin: 0.5 mg/dL (ref 0.3–1.2)
Total Protein: 6.8 g/dL (ref 6.5–8.1)

## 2023-04-14 LAB — CBC WITH DIFFERENTIAL/PLATELET
Abs Immature Granulocytes: 0.04 10*3/uL (ref 0.00–0.07)
Basophils Absolute: 0 10*3/uL (ref 0.0–0.1)
Basophils Relative: 1 %
Eosinophils Absolute: 0.3 10*3/uL (ref 0.0–0.5)
Eosinophils Relative: 4 %
HCT: 39.5 % (ref 39.0–52.0)
Hemoglobin: 12.8 g/dL — ABNORMAL LOW (ref 13.0–17.0)
Immature Granulocytes: 1 %
Lymphocytes Relative: 24 %
Lymphs Abs: 1.8 10*3/uL (ref 0.7–4.0)
MCH: 27 pg (ref 26.0–34.0)
MCHC: 32.4 g/dL (ref 30.0–36.0)
MCV: 83.3 fL (ref 80.0–100.0)
Monocytes Absolute: 1 10*3/uL (ref 0.1–1.0)
Monocytes Relative: 14 %
Neutro Abs: 4.3 10*3/uL (ref 1.7–7.7)
Neutrophils Relative %: 56 %
Platelets: 236 10*3/uL (ref 150–400)
RBC: 4.74 MIL/uL (ref 4.22–5.81)
RDW: 14.4 % (ref 11.5–15.5)
WBC: 7.5 10*3/uL (ref 4.0–10.5)
nRBC: 0 % (ref 0.0–0.2)

## 2023-04-17 LAB — KAPPA/LAMBDA LIGHT CHAINS
Kappa free light chain: 19.8 mg/L — ABNORMAL HIGH (ref 3.3–19.4)
Kappa, lambda light chain ratio: 1.12 (ref 0.26–1.65)
Lambda free light chains: 17.7 mg/L (ref 5.7–26.3)

## 2023-04-19 LAB — MULTIPLE MYELOMA PANEL, SERUM
Albumin SerPl Elph-Mcnc: 3.5 g/dL (ref 2.9–4.4)
Albumin/Glob SerPl: 1.3 (ref 0.7–1.7)
Alpha 1: 0.2 g/dL (ref 0.0–0.4)
Alpha2 Glob SerPl Elph-Mcnc: 0.8 g/dL (ref 0.4–1.0)
B-Globulin SerPl Elph-Mcnc: 1.1 g/dL (ref 0.7–1.3)
Gamma Glob SerPl Elph-Mcnc: 0.6 g/dL (ref 0.4–1.8)
Globulin, Total: 2.8 g/dL (ref 2.2–3.9)
IgA: 189 mg/dL (ref 61–437)
IgG (Immunoglobin G), Serum: 771 mg/dL (ref 603–1613)
IgM (Immunoglobulin M), Srm: 37 mg/dL (ref 20–172)
Total Protein ELP: 6.3 g/dL (ref 6.0–8.5)

## 2023-04-21 ENCOUNTER — Inpatient Hospital Stay: Payer: Medicare Other | Attending: Oncology | Admitting: Oncology

## 2023-04-21 ENCOUNTER — Encounter: Payer: Self-pay | Admitting: Oncology

## 2023-04-21 VITALS — BP 105/66 | HR 59 | Temp 97.6°F | Resp 18 | Wt 234.9 lb

## 2023-04-21 DIAGNOSIS — Z801 Family history of malignant neoplasm of trachea, bronchus and lung: Secondary | ICD-10-CM | POA: Diagnosis not present

## 2023-04-21 DIAGNOSIS — D649 Anemia, unspecified: Secondary | ICD-10-CM | POA: Diagnosis not present

## 2023-04-21 DIAGNOSIS — Z800 Family history of malignant neoplasm of digestive organs: Secondary | ICD-10-CM | POA: Diagnosis not present

## 2023-04-21 DIAGNOSIS — Z807 Family history of other malignant neoplasms of lymphoid, hematopoietic and related tissues: Secondary | ICD-10-CM | POA: Diagnosis not present

## 2023-04-21 DIAGNOSIS — D472 Monoclonal gammopathy: Secondary | ICD-10-CM | POA: Insufficient documentation

## 2023-04-21 DIAGNOSIS — Z8051 Family history of malignant neoplasm of kidney: Secondary | ICD-10-CM | POA: Insufficient documentation

## 2023-04-21 DIAGNOSIS — Z79899 Other long term (current) drug therapy: Secondary | ICD-10-CM | POA: Insufficient documentation

## 2023-04-21 DIAGNOSIS — Z7901 Long term (current) use of anticoagulants: Secondary | ICD-10-CM | POA: Insufficient documentation

## 2023-04-21 DIAGNOSIS — Z87891 Personal history of nicotine dependence: Secondary | ICD-10-CM | POA: Insufficient documentation

## 2023-04-21 DIAGNOSIS — Z85828 Personal history of other malignant neoplasm of skin: Secondary | ICD-10-CM | POA: Insufficient documentation

## 2023-04-21 DIAGNOSIS — I480 Paroxysmal atrial fibrillation: Secondary | ICD-10-CM | POA: Diagnosis not present

## 2023-04-21 NOTE — Assessment & Plan Note (Signed)
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  Immunofixation is positive.'  Light chain ratio is normal with slightly increased kappa free light chain and ratio.Marland Kitchen  Previous 24-hour UPEP is negative for M protein. Labs are reviewed and discussed with patient. Lab Results  Component Value Date   MPROTEIN Not Observed 04/14/2023   KPAFRELGTCHN 19.8 (H) 04/14/2023   LAMBDASER 17.7 04/14/2023   KAPLAMBRATIO 1.12 04/14/2023      Continue observation and repeat labs in 12 months.

## 2023-04-21 NOTE — Progress Notes (Signed)
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  Immunofixation is positive.'  Light chain ratio is normal with slightly increased kappa free light chain and ratio.Marland Kitchen  Previous 24-hour UPEP is negative for M protein. Labs are reviewed and discussed with patient. Lab Results  Component Value Date   MPROTEIN Not Observed 04/14/2023   KPAFRELGTCHN 19.8 (H) 04/14/2023   LAMBDASER 17.7 04/14/2023   KAPLAMBRATIO 1.12 04/14/2023      Continue observation and repeat labs in 12 months.   Orders Placed This Encounter  Procedures   CBC with Differential (Cancer Center Only)    Standing Status:   Future    Standing Expiration Date:   04/20/2024   CMP (Cancer Center only)    Standing Status:   Future    Standing Expiration Date:   04/20/2024   Multiple Myeloma Panel (SPEP&IFE w/QIG)    Standing Status:   Future    Standing Expiration Date:   04/20/2024   Kappa/lambda light chains    Standing Status:   Future    Standing Expiration Date:   04/20/2024   Follow-up in 12 months All questions were answered. The patient knows to call the clinic with any problems, questions or concerns.  Rickard Patience, MD, PhD [facility] Hematology Oncology 04/21/2023    HISTORY OF PRESENTING ILLNESS:   Brandon Henderson is a  67 y.o.  male with PMH listed below was seen in consultation at the request of  Lauro Regulus, MD  for evaluation of abnormal labs Patient has a history of chronic mild anemia. 02/22/2022, hemoglobin 12.2, MCV 84, normal platelet count and white count. Iron panel showed ferritin of 67, iron saturation 17 and TIBC 359. Protein  electrophoresis showed negative M protein, immunofixation showed IgG monoclonal protein with lambda light chain specialty.  Reviewed patient's previous blood work, patient has chronic anemia, dating back to at least 2021.  Hemoglobin 12s-13s Patient denies any unintentional weight loss, night sweats, fever. He drinks 1-2 beer, 5 days/week History of skin squamous cell carcinoma. History of angina I, coronary artery disease status post PCI/BMS.  Patient has paroxysmal atrial fibrillation patient takes apixaban.   INTERVAL HISTORY Brandon Henderson is a 67 y.o. male who has above history reviewed by me today presents for follow up visit  For MGUS He has no new complaints.  Accompanied by wife. Patient has no new complaints.  Review of Systems  Constitutional:  Negative for appetite change, chills, fatigue, fever and unexpected weight change.  HENT:   Negative for hearing loss and voice change.   Eyes:  Negative for eye problems and icterus.  Respiratory:  Negative for chest tightness, cough and shortness of breath.   Cardiovascular:  Negative for chest pain and leg swelling.  Gastrointestinal:  Negative for abdominal distention and abdominal pain.  Endocrine: Negative for hot flashes.  Genitourinary:  Negative for difficulty urinating, dysuria and frequency.   Musculoskeletal:  Negative for arthralgias.  Skin:  Negative for itching and rash.  Neurological:  Negative for light-headedness and numbness.  Hematological:  Negative for adenopathy. Does not bruise/bleed easily.  Psychiatric/Behavioral:  Negative for confusion.  MEDICAL HISTORY:  Past Medical History:  Diagnosis Date   Anginal pain (HCC)    Arthritis    Barrett esophagus    Carotid arterial disease (HCC)    a. 2011 s/p R CEA.   Coronary artery disease    a. 2000 s/p PCI/BMS x 2 of OM2 [place], [place]); b. 12/2018 MV: EF 43% (likely 2/2 GI uptake artifact No ischemia. Low risk; c. 03/2019 PCI: LM nl, LAD 56m, LCX nl, OM2  90 ISR, RCA 40p, 35m (3.5x15 Resolute Onyx DES), RPL2 70. d. 06/04/2019 LHC 1. severe OM2 (suspected in stent restenosis), mild to mod dz LAD & RCA, patent mRCA stent, nl LV pressure, balloon angio to OM2 90p to 40p   Depression    Diabetes mellitus without complication (HCC)    Prediabetes   Diastasis recti    GERD (gastroesophageal reflux disease)    History of hiatal hernia    History of kidney stones    History of tobacco abuse    Hyperlipidemia LDL goal <70    Hypertension    Morbid obesity (HCC)    Osteoarthritis    Sleep apnea    Squamous cell carcinoma of left ear    Squamous cell carcinoma, arm, right     SURGICAL HISTORY: Past Surgical History:  Procedure Laterality Date   artoscopic rotator cuff repair     BACK SURGERY  1991   CARDIAC CATHETERIZATION     CAROTID ARTERY ANGIOPLASTY  2011   carotid endartarectomy     COLONOSCOPY WITH PROPOFOL N/A 06/12/2018   Procedure: COLONOSCOPY WITH PROPOFOL;  Surgeon: Toledo, Boykin Nearing, MD;  Location: [facility] ENDOSCOPY;  Service: Gastroenterology;  Laterality: N/A;   CORONARY ANGIOPLASTY     CORONARY BALLOON ANGIOPLASTY N/A 06/04/2019   Procedure: CORONARY BALLOON ANGIOPLASTY;  Surgeon: Yvonne Kendall, MD;  Location: [facility] INVASIVE CV LAB;  Service: Cardiovascular;  Laterality: N/A;   CORONARY STENT INTERVENTION N/A 04/23/2019   Procedure: CORONARY STENT INTERVENTION;  Surgeon: Yvonne Kendall, MD;  Location: [facility] INVASIVE CV LAB;  Service: Cardiovascular;  Laterality: N/A;   ESOPHAGOGASTRODUODENOSCOPY (EGD) WITH PROPOFOL N/A 06/12/2018   Procedure: ESOPHAGOGASTRODUODENOSCOPY (EGD) WITH PROPOFOL;  Surgeon: Toledo, Boykin Nearing, MD;  Location: [facility] ENDOSCOPY;  Service: Gastroenterology;  Laterality: N/A;   LEFT HEART CATH AND CORONARY ANGIOGRAPHY N/A 04/23/2019   Procedure: LEFT HEART CATH AND CORONARY ANGIOGRAPHY;  Surgeon: Yvonne Kendall, MD;  Location: [facility] INVASIVE CV LAB;  Service: Cardiovascular;  Laterality: N/A;   LEFT HEART CATH AND  CORONARY ANGIOGRAPHY Left 06/04/2019   Procedure: LEFT HEART CATH AND CORONARY ANGIOGRAPHY;  Surgeon: Yvonne Kendall, MD;  Location: [facility] INVASIVE CV LAB;  Service: Cardiovascular;  Laterality: Left;   Rotator cuff  2010    SOCIAL HISTORY: Social History   Socioeconomic History   Marital status: Married    Spouse name: Not on file   Number of children: Not on file   Years of education: Not on file   Highest education level: Not on file  Occupational History   Not on file  Tobacco Use   Smoking status: Former    Packs/day: 1.50    Years: 40.00    Additional pack years: 0.00    Total pack years: 60.00    Types: Cigarettes, E-cigarettes    Quit date: 10/17/2018    Years since quitting: 4.5   Smokeless tobacco: Never   Tobacco comments:    quit vaping 10/2018. quit smoking 2016  Vaping Use   Vaping Use: Former   Quit date:  10/25/2018  Substance and Sexual Activity   Alcohol use: Yes    Alcohol/week: 4.0 standard drinks of alcohol    Types: 1 Glasses of wine, 3 Cans of beer per week    Comment: 7-14 cans of beer/week   Drug use: Not Currently    Types: Marijuana    Comment: in the past   Sexual activity: Not Currently  Other Topics Concern   Not on file  Social History Narrative   Lives locally.  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  Does not routinely exercise.   Social Determinants of Health   Financial Resource Strain: Low Risk  (06/04/2019)   Overall Financial Resource Strain (CARDIA)    Difficulty of Paying Living Expenses: Not hard at all  Food Insecurity: No Food Insecurity (06/04/2019)   Hunger Vital Sign    Worried About Running Out of Food in the Last Year: Never true    Ran Out of Food in the Last Year: Never true  Transportation Needs: Unmet Transportation Needs (06/04/2019)   PRAPARE - Administrator, Civil Service (Medical): Yes    Lack of Transportation (Non-Medical): Yes  Physical Activity: Not on file  Stress: Not on file  Social Connections: Not on file   Intimate Partner Violence: Not At Risk (06/04/2019)   Humiliation, Afraid, Rape, and Kick questionnaire    Fear of Current or Ex-Partner: No    Emotionally Abused: No    Physically Abused: No    Sexually Abused: No    FAMILY HISTORY: Family History  Problem Relation Age of Onset   Heart attack Mother    Stroke Father    Colon cancer Sister    Lung cancer Sister    Kidney cancer Brother    Non-Hodgkin's lymphoma Brother    Colon cancer Brother     ALLERGIES:  is allergic to contrast media [iodinated contrast media].  MEDICATIONS:  Current Outpatient Medications  Medication Sig Dispense Refill   apixaban (ELIQUIS) 5 MG TABS tablet Take 5 mg by mouth 2 (two) times daily.     Ascorbic Acid (VITAMIN C) 1000 MG tablet Take 1,000 mg by mouth at bedtime.      atorvastatin (LIPITOR) 40 MG tablet Take 1 tablet (40 mg total) by mouth daily. Please call to schedule an appointment for further refills. (Patient taking differently: Take 80 mg by mouth daily. Please call to schedule an appointment for further refills.) 90 tablet 0   clopidogrel (PLAVIX) 75 MG tablet Take 1 tablet (75 mg total) by mouth daily with breakfast. (Patient taking differently: Take 75 mg by mouth at bedtime.) 90 tablet 3   Coenzyme Q10 (COQ10) 200 MG CAPS Take 200 mg by mouth at bedtime.     escitalopram (LEXAPRO) 20 MG tablet Take 20 mg by mouth daily.     Glucosamine-Chondroitin (COSAMIN DS PO) Take 2 tablets by mouth at bedtime.      isosorbide mononitrate (IMDUR) 30 MG 24 hr tablet Take 1 tablet (30 mg total) by mouth daily. (Patient taking differently: Take 30 mg by mouth at bedtime.) 90 tablet 3   magnesium oxide (MAG-OX) 400 MG tablet Take 400 mg by mouth at bedtime. 2 tabs in the morning and 1 tab at night     metoprolol tartrate (LOPRESSOR) 25 MG tablet Take 0.5 tablets (12.5 mg total) by mouth 2 (two) times daily. 90 tablet 3   Multiple Vitamin (MULTIVITAMIN WITH MINERALS) TABS tablet Take 1 tablet by mouth at  bedtime.     pantoprazole (  PROTONIX) 40 MG tablet Take 40 mg by mouth at bedtime.      ramipril (ALTACE) 5 MG capsule Take 1 capsule (5 mg total) by mouth daily. Please call to schedule appointment for further refills. 90 capsule 0   nitroGLYCERIN (NITROSTAT) 0.4 MG SL tablet Place 1 tablet (0.4 mg total) under the tongue every 5 (five) minutes as needed for chest pain. (Patient not taking: Reported on 03/22/2022) 90 tablet 3   ondansetron (ZOFRAN-ODT) 4 MG disintegrating tablet Take 1 tablet (4 mg total) by mouth every 8 (eight) hours as needed for nausea or vomiting. (Patient not taking: Reported on 10/14/2022) 15 tablet 0   oxyCODONE-acetaminophen (PERCOCET) 7.5-325 MG tablet Take 1 tablet by mouth every 6 (six) hours as needed for severe pain. (Patient not taking: Reported on 10/14/2022) 20 tablet 0   No current facility-administered medications for this visit.     PHYSICAL EXAMINATION:  Vitals:   04/21/23 1206  BP: 105/66  Pulse: (!) 59  Resp: 18  Temp: 97.6 F (36.4 C)   Filed Weights   04/21/23 1206  Weight: 234 lb 14.4 oz (106.5 kg)    Physical Exam Constitutional:      General: He is not in acute distress.    Appearance: He is obese.  HENT:     Head: Normocephalic and atraumatic.  Eyes:     General: No scleral icterus. Cardiovascular:     Rate and Rhythm: Normal rate.  Pulmonary:     Effort: Pulmonary effort is normal. No respiratory distress.  Abdominal:     General: Bowel sounds are normal. There is no distension.     Palpations: Abdomen is soft.  Musculoskeletal:        General: No deformity. Normal range of motion.     Cervical back: Normal range of motion.  Skin:    Findings: No erythema or rash.  Neurological:     Mental Status: He is alert and oriented to person, place, and time. Mental status is at baseline.  Psychiatric:        Mood and Affect: Mood normal.     LABORATORY DATA:  I have reviewed the data as listed    Latest Ref Rng & Units  04/14/2023    1:10 PM 10/06/2022    9:53 AM 07/08/2022   11:21 AM  CBC  WBC 4.0 - 10.5 K/uL 7.5  7.0  17.8   Hemoglobin 13.0 - 17.0 g/dL 32.9  51.8  84.1   Hematocrit 39.0 - 52.0 % 39.5  41.1  41.8   Platelets 150 - 400 K/uL 236  243  237       Latest Ref Rng & Units 04/14/2023    1:10 PM 10/06/2022    9:53 AM 07/08/2022   11:21 AM  CMP  Glucose 70 - 99 mg/dL 660  630  160   BUN 8 - 23 mg/dL 20  26  21    Creatinine 0.61 - 1.24 mg/dL 1.09  3.23  5.57   Sodium 135 - 145 mmol/L 136  137  136   Potassium 3.5 - 5.1 mmol/L 4.3  4.6  4.6   Chloride 98 - 111 mmol/L 102  102  100   CO2 22 - 32 mmol/L 24  26  26    Calcium 8.9 - 10.3 mg/dL 8.6  8.9  9.3   Total Protein 6.5 - 8.1 g/dL 6.8  7.0  7.6   Total Bilirubin 0.3 - 1.2 mg/dL 0.5  0.6  1.2  Alkaline Phos 38 - 126 U/L 88  65  53   AST 15 - 41 U/L 24  24  21    ALT 0 - 44 U/L 23  20  21       RADIOGRAPHIC STUDIES: I have personally reviewed the radiological images as listed and agreed with the findings in the report. No results found.

## 2023-05-11 ENCOUNTER — Encounter: Payer: Self-pay | Admitting: *Deleted

## 2023-05-12 ENCOUNTER — Encounter
Admission: RE | Disposition: A | Discharge status: Home / Self Care | Payer: Self-pay | Source: Ambulatory Visit | Attending: Gastroenterology

## 2023-05-12 ENCOUNTER — Ambulatory Visit: Payer: Medicare Other

## 2023-05-12 ENCOUNTER — Ambulatory Visit
Admission: RE | Admit: 2023-05-12 | Discharge: 2023-05-12 | Disposition: A | Discharge status: Home / Self Care | Payer: Medicare Other | Source: Ambulatory Visit | Attending: Gastroenterology | Admitting: Gastroenterology

## 2023-05-12 DIAGNOSIS — I2510 Atherosclerotic heart disease of native coronary artery without angina pectoris: Secondary | ICD-10-CM | POA: Insufficient documentation

## 2023-05-12 DIAGNOSIS — Z800 Family history of malignant neoplasm of digestive organs: Secondary | ICD-10-CM | POA: Insufficient documentation

## 2023-05-12 DIAGNOSIS — I10 Essential (primary) hypertension: Secondary | ICD-10-CM | POA: Diagnosis not present

## 2023-05-12 DIAGNOSIS — Z79899 Other long term (current) drug therapy: Secondary | ICD-10-CM | POA: Insufficient documentation

## 2023-05-12 DIAGNOSIS — Z85828 Personal history of other malignant neoplasm of skin: Secondary | ICD-10-CM | POA: Insufficient documentation

## 2023-05-12 DIAGNOSIS — F32A Depression, unspecified: Secondary | ICD-10-CM | POA: Diagnosis not present

## 2023-05-12 DIAGNOSIS — K219 Gastro-esophageal reflux disease without esophagitis: Secondary | ICD-10-CM | POA: Insufficient documentation

## 2023-05-12 DIAGNOSIS — G4730 Sleep apnea, unspecified: Secondary | ICD-10-CM | POA: Insufficient documentation

## 2023-05-12 DIAGNOSIS — Z87891 Personal history of nicotine dependence: Secondary | ICD-10-CM | POA: Diagnosis not present

## 2023-05-12 DIAGNOSIS — Z6836 Body mass index (BMI) 36.0-36.9, adult: Secondary | ICD-10-CM | POA: Diagnosis not present

## 2023-05-12 DIAGNOSIS — E785 Hyperlipidemia, unspecified: Secondary | ICD-10-CM | POA: Insufficient documentation

## 2023-05-12 DIAGNOSIS — K449 Diaphragmatic hernia without obstruction or gangrene: Secondary | ICD-10-CM | POA: Insufficient documentation

## 2023-05-12 DIAGNOSIS — E1151 Type 2 diabetes mellitus with diabetic peripheral angiopathy without gangrene: Secondary | ICD-10-CM | POA: Insufficient documentation

## 2023-05-12 DIAGNOSIS — I25119 Atherosclerotic heart disease of native coronary artery with unspecified angina pectoris: Secondary | ICD-10-CM | POA: Insufficient documentation

## 2023-05-12 DIAGNOSIS — Z955 Presence of coronary angioplasty implant and graft: Secondary | ICD-10-CM | POA: Diagnosis not present

## 2023-05-12 DIAGNOSIS — K5730 Diverticulosis of large intestine without perforation or abscess without bleeding: Secondary | ICD-10-CM | POA: Diagnosis not present

## 2023-05-12 DIAGNOSIS — Z7902 Long term (current) use of antithrombotics/antiplatelets: Secondary | ICD-10-CM | POA: Insufficient documentation

## 2023-05-12 DIAGNOSIS — K2270 Barrett's esophagus without dysplasia: Secondary | ICD-10-CM | POA: Diagnosis present

## 2023-05-12 DIAGNOSIS — K640 First degree hemorrhoids: Secondary | ICD-10-CM | POA: Diagnosis not present

## 2023-05-12 DIAGNOSIS — Z7901 Long term (current) use of anticoagulants: Secondary | ICD-10-CM | POA: Diagnosis not present

## 2023-05-12 DIAGNOSIS — Z8719 Personal history of other diseases of the digestive system: Secondary | ICD-10-CM | POA: Diagnosis not present

## 2023-05-12 DIAGNOSIS — Z1211 Encounter for screening for malignant neoplasm of colon: Secondary | ICD-10-CM | POA: Diagnosis not present

## 2023-05-12 HISTORY — PX: COLONOSCOPY WITH PROPOFOL: SHX5780

## 2023-05-12 HISTORY — PX: BIOPSY: SHX5522

## 2023-05-12 HISTORY — PX: ESOPHAGOGASTRODUODENOSCOPY (EGD) WITH PROPOFOL: SHX5813

## 2023-05-12 LAB — GLUCOSE, CAPILLARY: Glucose-Capillary: 118 mg/dL — ABNORMAL HIGH (ref 70–99)

## 2023-05-12 SURGERY — COLONOSCOPY WITH PROPOFOL
Anesthesia: General

## 2023-05-12 MED ORDER — SODIUM CHLORIDE 0.9 % IV SOLN: INTRAVENOUS | Status: DC

## 2023-05-12 MED ORDER — PROPOFOL 10 MG/ML IV BOLUS
INTRAVENOUS | Status: AC
  Filled 2023-05-12: qty 20

## 2023-05-12 MED ORDER — PROPOFOL 10 MG/ML IV BOLUS
INTRAVENOUS | Status: AC
  Filled 2023-05-12: qty 40

## 2023-05-12 MED ORDER — ONDANSETRON HCL 4 MG/2ML IJ SOLN
INTRAMUSCULAR | Status: DC | PRN
Start: 2023-05-12 — End: 2023-05-12
  Administered 2023-05-12: 4 mg via INTRAVENOUS

## 2023-05-12 MED ORDER — ONDANSETRON HCL 4 MG/2ML IJ SOLN
INTRAMUSCULAR | Status: AC
  Filled 2023-05-12: qty 2

## 2023-05-12 MED ORDER — PROPOFOL 500 MG/50ML IV EMUL
INTRAVENOUS | Status: DC | PRN
  Administered 2023-05-12: 100 ug/kg/min via INTRAVENOUS

## 2023-05-12 MED ORDER — LIDOCAINE HCL (CARDIAC) PF 100 MG/5ML IV SOSY
PREFILLED_SYRINGE | INTRAVENOUS | Status: DC | PRN
  Administered 2023-05-12: 100 mg via INTRAVENOUS

## 2023-05-12 MED ORDER — PROPOFOL 10 MG/ML IV BOLUS
INTRAVENOUS | Status: DC | PRN
  Administered 2023-05-12: 25 mg via INTRAVENOUS
  Administered 2023-05-12: 75 mg via INTRAVENOUS

## 2023-05-12 MED ORDER — LIDOCAINE HCL (PF) 2 % IJ SOLN
INTRAMUSCULAR | Status: AC
  Filled 2023-05-12: qty 5

## 2023-05-12 NOTE — Anesthesia Postprocedure Evaluation (Signed)
Anesthesia Post Note  Patient: Brandon Henderson  Procedure(s) Performed: COLONOSCOPY WITH PROPOFOL ESOPHAGOGASTRODUODENOSCOPY (EGD) WITH PROPOFOL  Patient location during evaluation: PACU Anesthesia Type: General Level of consciousness: awake and oriented Pain management: satisfactory to patient Vital Signs Assessment: post-procedure vital signs reviewed and stable Respiratory status: spontaneous breathing and nonlabored ventilation Cardiovascular status: blood pressure returned to baseline Anesthetic complications: no   No notable events documented.   Last Vitals:  Vitals:   05/12/23 1005 05/12/23 1137  BP: 129/80 (!) 135/52  Pulse: (!) 58 60  Resp: 18 18  Temp: (!) 36.1 C (!) 36.3 C  SpO2: 98% 94%    Last Pain:  Vitals:   05/12/23 1157  TempSrc:   PainSc: 0-No pain                 VAN STAVEREN,Kenzly Rogoff

## 2023-05-12 NOTE — Op Note (Signed)
[facility] Gastroenterology Patient Name: Brandon Henderson Procedure Date: 05/12/2023 10:57 AM MRN: [id number] Account #: [id number] Date of Birth: 1956/07/28 Admit Type: Outpatient Age: 67 Room: [facility] ENDO ROOM 3 Gender: Male Note Status: Finalized Instrument Name: Prentice Docker [id number] Procedure:             Colonoscopy Indications:           Screening patient at increased risk: Family history of                         colorectal cancer in multiple 1st-degree relatives Providers:             Eather Colas MD, MD Referring MD:          Marya Amsler. Dareen Piano MD, MD (Referring MD) Medicines:             Monitored Anesthesia Care Complications:         No immediate complications. Procedure:             Pre-Anesthesia Assessment:                        - Prior to the procedure, a History and Physical was                         performed, and patient medications and allergies were                         reviewed. The patient is competent. The risks and                         benefits of the procedure and the sedation options and                         risks were discussed with the patient. All questions                         were answered and informed consent was obtained.                         Patient identification and proposed procedure were                         verified by the physician, the nurse, the                         anesthesiologist, the anesthetist and the technician                         in the endoscopy suite. Mental Status Examination:                         alert and oriented. Airway Examination: normal                         oropharyngeal airway and neck mobility. Respiratory                         Examination: clear to auscultation. CV Examination:  normal. Prophylactic Antibiotics: The patient does not                         require prophylactic antibiotics. Prior                         Anticoagulants: The  patient has taken Plavix                         (clopidogrel), last dose was 5 days prior to                         procedure. ASA Grade Assessment: III - A patient with                         severe systemic disease. After reviewing the risks and                         benefits, the patient was deemed in satisfactory                         condition to undergo the procedure. The anesthesia                         plan was to use monitored anesthesia care (MAC).                         Immediately prior to administration of medications,                         the patient was re-assessed for adequacy to receive                         sedatives. The heart rate, respiratory rate, oxygen                         saturations, blood pressure, adequacy of pulmonary                         ventilation, and response to care were monitored                         throughout the procedure. The physical status of the                         patient was re-assessed after the procedure.                        After obtaining informed consent, the colonoscope was                         passed under direct vision. Throughout the procedure,                         the patient's blood pressure, pulse, and oxygen                         saturations were monitored continuously. The  Colonoscope was introduced through the anus and                         advanced to the the cecum, identified by appendiceal                         orifice and ileocecal valve. The colonoscopy was                         somewhat difficult due to significant looping.                         Successful completion of the procedure was aided by                         changing the patient to a supine position and applying                         abdominal pressure. The patient tolerated the                         procedure well. The quality of the bowel preparation                         was good. The  ileocecal valve, appendiceal orifice,                         and rectum were photographed. Findings:      The perianal and digital rectal examinations were normal.      Multiple large-mouthed and small-mouthed diverticula were found in the       descending colon and transverse colon.      Many large-mouthed and small-mouthed diverticula were found in the       sigmoid colon.      Internal hemorrhoids were found during retroflexion. The hemorrhoids       were Grade I (internal hemorrhoids that do not prolapse).      The exam was otherwise without abnormality on direct and retroflexion       views. Impression:            - Diverticulosis in the descending colon and in the                         transverse colon.                        - Diverticulosis in the sigmoid colon.                        - Internal hemorrhoids.                        - The examination was otherwise normal on direct and                         retroflexion views.                        - No specimens collected. Recommendation:        - Discharge patient to home.                        -  Resume previous diet.                        - Continue present medications.                        - Resume Plavix (clopidogrel) at prior dose tomorrow.                        - Repeat colonoscopy in 5 years for surveillance.                        - Return to referring physician as previously                         scheduled. Procedure Code(s):     --- Professional ---                        E4540, Colorectal cancer screening; colonoscopy on                         individual at high risk Diagnosis Code(s):     --- Professional ---                        Z80.0, Family history of malignant neoplasm of                         digestive organs                        K64.0, First degree hemorrhoids                        K57.30, Diverticulosis of large intestine without                         perforation or abscess without  bleeding CPT copyright 2022 [employer]. All rights reserved. The codes documented in this report are preliminary and upon coder review may  be revised to meet current compliance requirements. Eather Colas MD, MD 05/12/2023 11:45:38 AM Number of Addenda: 0 Note Initiated On: 05/12/2023 10:57 AM Scope Withdrawal Time: 0 hours 5 minutes 36 seconds  Total Procedure Duration: 0 hours 22 minutes 25 seconds  Estimated Blood Loss:  Estimated blood loss: none.      [facility]

## 2023-05-12 NOTE — Anesthesia Preprocedure Evaluation (Signed)
Anesthesia Evaluation  Patient identified by MRN, date of birth, ID band Patient awake    Reviewed: Allergy & Precautions, NPO status , Patient's Chart, lab work & pertinent test results  Airway Mallampati: III  TM Distance: >3 FB Neck ROM: full    Dental  (+) Edentulous Upper, Edentulous Lower   Pulmonary neg pulmonary ROS, sleep apnea and Continuous Positive Airway Pressure Ventilation , former smoker   Pulmonary exam normal breath sounds clear to auscultation       Cardiovascular hypertension, Pt. on medications + angina  + CAD and + Cardiac Stents  negative cardio ROS Normal cardiovascular exam Rhythm:Regular Rate:Normal     Neuro/Psych    Depression    negative neurological ROS  negative psych ROS   GI/Hepatic negative GI ROS, Neg liver ROS, hiatal hernia,GERD  Medicated,,  Endo/Other  negative endocrine ROSdiabetes, Type 2, Oral Hypoglycemic Agents  Morbid obesity  Renal/GU negative Renal ROS  negative genitourinary   Musculoskeletal  (+) Arthritis ,    Abdominal  (+) + obese  Peds negative pediatric ROS (+)  Hematology negative hematology ROS (+) Blood dyscrasia, anemia   Anesthesia Other Findings Past Medical History: No date: Anginal pain (HCC) No date: Arthritis No date: Barrett esophagus No date: Carotid arterial disease (HCC)     Comment:  a. 2011 s/p R CEA. No date: Coronary artery disease     Comment:  a. 2000 s/p PCI/BMS x 2 of OM2 Eliberto Ivory, [place]); b. 12/2018               MV: EF 43% (likely 2/2 GI uptake artifact No ischemia.               Low risk; c. 03/2019 PCI: LM nl, LAD 57m, LCX nl, OM2 90               ISR, RCA 40p, 61m (3.5x15 Resolute Onyx DES), RPL2 70. d.              06/04/2019 LHC 1. severe OM2 (suspected in stent               restenosis), mild to mod dz LAD & RCA, patent mRCA stent,              nl LV pressure, balloon angio to OM2 90p to 40p No date: Depression No date: Diabetes  mellitus without complication (HCC)     Comment:  Prediabetes No date: Diastasis recti No date: GERD (gastroesophageal reflux disease) No date: History of hiatal hernia No date: History of kidney stones No date: History of tobacco abuse No date: Hyperlipidemia LDL goal <70 No date: Hypertension No date: Morbid obesity (HCC) No date: Osteoarthritis No date: Sleep apnea No date: Squamous cell carcinoma of left ear No date: Squamous cell carcinoma, arm, right  Past Surgical History: No date: artoscopic rotator cuff repair 1991: BACK SURGERY No date: CARDIAC CATHETERIZATION 2011: CAROTID ARTERY ANGIOPLASTY No date: carotid endartarectomy 06/12/2018: COLONOSCOPY WITH PROPOFOL; N/A     Comment:  Procedure: COLONOSCOPY WITH PROPOFOL;  Surgeon: Toledo,               Boykin Nearing, MD;  Location: [facility] ENDOSCOPY;  Service:               Gastroenterology;  Laterality: N/A; No date: CORONARY ANGIOPLASTY 06/04/2019: CORONARY BALLOON ANGIOPLASTY; N/A     Comment:  Procedure: CORONARY BALLOON ANGIOPLASTY;  Surgeon: End,  Cristal Deer, MD;  Location: [facility] INVASIVE CV LAB;                Service: Cardiovascular;  Laterality: N/A; 04/23/2019: CORONARY STENT INTERVENTION; N/A     Comment:  Procedure: CORONARY STENT INTERVENTION;  Surgeon: Yvonne Kendall, MD;  Location: [facility] INVASIVE CV LAB;                Service: Cardiovascular;  Laterality: N/A; 06/12/2018: ESOPHAGOGASTRODUODENOSCOPY (EGD) WITH PROPOFOL; N/A     Comment:  Procedure: ESOPHAGOGASTRODUODENOSCOPY (EGD) WITH               PROPOFOL;  Surgeon: Toledo, Boykin Nearing, MD;  Location:               [facility] ENDOSCOPY;  Service: Gastroenterology;  Laterality:               N/A; 04/23/2019: LEFT HEART CATH AND CORONARY ANGIOGRAPHY; N/A     Comment:  Procedure: LEFT HEART CATH AND CORONARY ANGIOGRAPHY;                Surgeon: Yvonne Kendall, MD;  Location: [facility] INVASIVE               CV LAB;  Service: Cardiovascular;   Laterality: N/A; 06/04/2019: LEFT HEART CATH AND CORONARY ANGIOGRAPHY; Left     Comment:  Procedure: LEFT HEART CATH AND CORONARY ANGIOGRAPHY;                Surgeon: Yvonne Kendall, MD;  Location: [facility] INVASIVE               CV LAB;  Service: Cardiovascular;  Laterality: Left; 2010: Rotator cuff  BMI    Body Mass Index: 36.65 kg/m      Reproductive/Obstetrics negative OB ROS                             Anesthesia Physical Anesthesia Plan  ASA: 3  Anesthesia Plan: General   Post-op Pain Management:    Induction: Intravenous  PONV Risk Score and Plan: Propofol infusion and TIVA  Airway Management Planned: Natural Airway  Additional Equipment:   Intra-op Plan:   Post-operative Plan:   Informed Consent: I have reviewed the patients History and Physical, chart, labs and discussed the procedure including the risks, benefits and alternatives for the proposed anesthesia with the patient or authorized representative who has indicated his/her understanding and acceptance.     Dental Advisory Given  Plan Discussed with: CRNA and Surgeon  Anesthesia Plan Comments:        Anesthesia Quick Evaluation

## 2023-05-12 NOTE — Op Note (Signed)
[facility] Gastroenterology Patient Name: Brandon Henderson Procedure Date: 05/12/2023 10:58 AM MRN: [id number] Account #: [id number] Date of Birth: Nov 02, 1955 Admit Type: Outpatient Age: 67 Room: [facility] ENDO ROOM 3 Gender: Male Note Status: Finalized Instrument Name: Patton Salles Endoscope [id number] Procedure:             Upper GI endoscopy Indications:           Barrett's esophagus Providers:             Eather Colas MD, MD Referring MD:          Marya Amsler. Dareen Piano MD, MD (Referring MD) Medicines:             Monitored Anesthesia Care Complications:         No immediate complications. Estimated blood loss:                         Minimal. Procedure:             Pre-Anesthesia Assessment:                        - Prior to the procedure, a History and Physical was                         performed, and patient medications and allergies were                         reviewed. The patient is competent. The risks and                         benefits of the procedure and the sedation options and                         risks were discussed with the patient. All questions                         were answered and informed consent was obtained.                         Patient identification and proposed procedure were                         verified by the physician, the nurse, the                         anesthesiologist, the anesthetist and the technician                         in the endoscopy suite. Mental Status Examination:                         alert and oriented. Airway Examination: normal                         oropharyngeal airway and neck mobility. Respiratory                         Examination: clear to auscultation. CV Examination:  normal. Prophylactic Antibiotics: The patient does not                         require prophylactic antibiotics. Prior                         Anticoagulants: The patient has taken Eliquis                          (apixaban), last dose was 3 days prior to procedure.                         ASA Grade Assessment: III - A patient with severe                         systemic disease. After reviewing the risks and                         benefits, the patient was deemed in satisfactory                         condition to undergo the procedure. The anesthesia                         plan was to use monitored anesthesia care (MAC).                         Immediately prior to administration of medications,                         the patient was re-assessed for adequacy to receive                         sedatives. The heart rate, respiratory rate, oxygen                         saturations, blood pressure, adequacy of pulmonary                         ventilation, and response to care were monitored                         throughout the procedure. The physical status of the                         patient was re-assessed after the procedure.                        After obtaining informed consent, the endoscope was                         passed under direct vision. Throughout the procedure,                         the patient's blood pressure, pulse, and oxygen                         saturations were monitored continuously. The Endoscope  was introduced through the mouth, and advanced to the                         second part of duodenum. The upper GI endoscopy was                         accomplished without difficulty. The patient tolerated                         the procedure well. Findings:      Scattered islands of salmon-colored mucosa were present. No other       visible abnormalities were present. The maximum longitudinal extent of       these esophageal mucosal changes was 1 cm in length. Biopsies were taken       with a cold forceps for histology. Estimated blood loss was minimal.      A small hiatal hernia was present.      The entire examined stomach was  normal.      The examined duodenum was normal. Impression:            - Salmon-colored mucosa. Biopsied.                        - Small hiatal hernia.                        - Normal stomach.                        - Normal examined duodenum. Recommendation:        - Discharge patient to home.                        - Resume previous diet.                        - Continue present medications.                        - Await pathology results.                        - Return to referring physician as previously                         scheduled.                        - Resume Eliquis (apixaban) at prior dose today. Procedure Code(s):     --- Professional ---                        [phone], Esophagogastroduodenoscopy, flexible,                         transoral; with biopsy, single or multiple Diagnosis Code(s):     --- Professional ---                        K22.70, Barrett's esophagus without dysplasia                        K44.9, Diaphragmatic  hernia without obstruction or                         gangrene CPT copyright 2022 American Medical Association. All rights reserved. The codes documented in this report are preliminary and upon coder review may  be revised to meet current compliance requirements. Eather Colas MD, MD 05/12/2023 11:40:11 AM Number of Addenda: 0 Note Initiated On: 05/12/2023 10:58 AM Estimated Blood Loss:  Estimated blood loss was minimal.      [facility]

## 2023-05-12 NOTE — Interval H&P Note (Signed)
History and Physical Interval Note:  05/12/2023 10:51 AM  Brandon Henderson  has presented today for surgery, with the diagnosis of BARRETE'S ESOPHAGUS,FAMILY HX OF COLON CANCER.  The various methods of treatment have been discussed with the patient and family. After consideration of risks, benefits and other options for treatment, the patient has consented to  Procedure(s): COLONOSCOPY WITH PROPOFOL (N/A) ESOPHAGOGASTRODUODENOSCOPY (EGD) WITH PROPOFOL (N/A) as a surgical intervention.  The patient's history has been reviewed, patient examined, no change in status, stable for surgery.  I have reviewed the patient's chart and labs.  Questions were answered to the patient's satisfaction.     Regis Bill  Ok to proceed with EGD/Colonoscopy

## 2023-05-12 NOTE — H&P (Signed)
Outpatient short stay form Pre-procedure 05/12/2023  Brandon Bill, MD  Primary Physician: Lauro Regulus, MD  Reason for visit:  BE and family history of colon cancer  History of present illness:    67 y/o gentleman with history of CAD, PVD, obesity, and BE here for EGD/Colonoscopy for BE and screening colonoscopy. Last dose of plavix 5 days ago and last dose of eliquis 3 days ago. Brother and sister with colon cancer (older than 60). No significant abdominal surgeries.    Current Facility-Administered Medications:    0.9 %  sodium chloride infusion, , Intravenous, Continuous, Nanetta Wiegman, Rossie Muskrat, MD, Last Rate: 20 mL/hr at 05/12/23 1024, New Bag at 05/12/23 1024  Medications Prior to Admission  Medication Sig Dispense Refill Last Dose   atorvastatin (LIPITOR) 40 MG tablet Take 1 tablet (40 mg total) by mouth daily. Please call to schedule an appointment for further refills. (Patient taking differently: Take 80 mg by mouth daily. Please call to schedule an appointment for further refills.) 90 tablet 0 05/11/2023   escitalopram (LEXAPRO) 20 MG tablet Take 20 mg by mouth daily.   05/11/2023   isosorbide mononitrate (IMDUR) 30 MG 24 hr tablet Take 1 tablet (30 mg total) by mouth daily. (Patient taking differently: Take 30 mg by mouth at bedtime.) 90 tablet 3 05/12/2023   metoprolol tartrate (LOPRESSOR) 25 MG tablet Take 0.5 tablets (12.5 mg total) by mouth 2 (two) times daily. 90 tablet 3 05/12/2023   pantoprazole (PROTONIX) 40 MG tablet Take 40 mg by mouth at bedtime.    05/12/2023   ramipril (ALTACE) 5 MG capsule Take 1 capsule (5 mg total) by mouth daily. Please call to schedule appointment for further refills. 90 capsule 0 05/12/2023   apixaban (ELIQUIS) 5 MG TABS tablet Take 5 mg by mouth 2 (two) times daily.   05/08/2023   Ascorbic Acid (VITAMIN C) 1000 MG tablet Take 1,000 mg by mouth at bedtime.       clopidogrel (PLAVIX) 75 MG tablet Take 1 tablet (75 mg total) by mouth daily  with breakfast. (Patient taking differently: Take 75 mg by mouth at bedtime.) 90 tablet 3 05/07/2023   Coenzyme Q10 (COQ10) 200 MG CAPS Take 200 mg by mouth at bedtime.      Glucosamine-Chondroitin (COSAMIN DS PO) Take 2 tablets by mouth at bedtime.       magnesium oxide (MAG-OX) 400 MG tablet Take 400 mg by mouth at bedtime. 2 tabs in the morning and 1 tab at night      Multiple Vitamin (MULTIVITAMIN WITH MINERALS) TABS tablet Take 1 tablet by mouth at bedtime.      nitroGLYCERIN (NITROSTAT) 0.4 MG SL tablet Place 1 tablet (0.4 mg total) under the tongue every 5 (five) minutes as needed for chest pain. (Patient not taking: Reported on 03/22/2022) 90 tablet 3    ondansetron (ZOFRAN-ODT) 4 MG disintegrating tablet Take 1 tablet (4 mg total) by mouth every 8 (eight) hours as needed for nausea or vomiting. (Patient not taking: Reported on 10/14/2022) 15 tablet 0    oxyCODONE-acetaminophen (PERCOCET) 7.5-325 MG tablet Take 1 tablet by mouth every 6 (six) hours as needed for severe pain. (Patient not taking: Reported on 10/14/2022) 20 tablet 0      Allergies  Allergen Reactions   Contrast Media [Iodinated Contrast Media] Rash     Past Medical History:  Diagnosis Date   Anginal pain (HCC)    Arthritis    Barrett esophagus    Carotid arterial  disease (HCC)    a. 2011 s/p R CEA.   Coronary artery disease    a. 2000 s/p PCI/BMS x 2 of OM2 Eliberto [place], [place]); b. 12/2018 MV: EF 43% (likely 2/2 GI uptake artifact No ischemia. Low risk; c. 03/2019 PCI: LM nl, LAD 66m, LCX nl, OM2 90 ISR, RCA 40p, 52m (3.5x15 Resolute Onyx DES), RPL2 70. d. 06/04/2019 LHC 1. severe OM2 (suspected in stent restenosis), mild to mod dz LAD & RCA, patent mRCA stent, nl LV pressure, balloon angio to OM2 90p to 40p   Depression    Diabetes mellitus without complication (HCC)    Prediabetes   Diastasis recti    GERD (gastroesophageal reflux disease)    History of hiatal hernia    History of kidney stones    History of tobacco abuse     Hyperlipidemia LDL goal <70    Hypertension    Morbid obesity (HCC)    Osteoarthritis    Sleep apnea    Squamous cell carcinoma of left ear    Squamous cell carcinoma, arm, right     Review of systems:  Otherwise negative.    Physical Exam  Gen: Alert, oriented. Appears stated age.  HEENT: PERRLA. Lungs: No respiratory distress CV: RRR Abd: soft, benign, no masses Ext: No edema    Planned procedures: Proceed with EGD/colonoscopy. The patient understands the nature of the planned procedure, indications, risks, alternatives and potential complications including but not limited to bleeding, infection, perforation, damage to internal organs and possible oversedation/side effects from anesthesia. The patient agrees and gives consent to proceed.  Please refer to procedure notes for findings, recommendations and patient disposition/instructions.     Brandon Bill, MD [facility] Gastroenterology

## 2023-05-12 NOTE — Transfer of Care (Signed)
Immediate Anesthesia Transfer of Care Note  Patient: Brandon Henderson  Procedure(s) Performed: COLONOSCOPY WITH PROPOFOL ESOPHAGOGASTRODUODENOSCOPY (EGD) WITH PROPOFOL  Patient Location: PACU  Anesthesia Type:MAC  Level of Consciousness: drowsy  Airway & Oxygen Therapy: Patient Spontanous Breathing  Post-op Assessment: Report given to RN  Post vital signs: Reviewed and stable  Last Vitals:  Vitals Value Taken Time  BP 135/52 05/12/23 1137  Temp 36.3 C 05/12/23 1137  Pulse 59 05/12/23 1138  Resp 19 05/12/23 1138  SpO2 94 % 05/12/23 1138  Vitals shown include unfiled device data.  Last Pain:  Vitals:   05/12/23 1137  TempSrc: Temporal  PainSc: Asleep         Complications: No notable events documented.

## 2023-05-15 ENCOUNTER — Encounter: Payer: Self-pay | Admitting: Gastroenterology

## 2023-09-23 ENCOUNTER — Other Ambulatory Visit: Payer: Self-pay

## 2023-09-23 ENCOUNTER — Emergency Department: Payer: Medicare Other

## 2023-09-23 ENCOUNTER — Emergency Department
Admission: EM | Admit: 2023-09-23 | Discharge: 2023-09-23 | Disposition: A | Discharge status: Home / Self Care | Payer: Medicare Other | Attending: Emergency Medicine | Admitting: Emergency Medicine

## 2023-09-23 ENCOUNTER — Encounter: Payer: Self-pay | Admitting: Radiology

## 2023-09-23 DIAGNOSIS — S6992XA Unspecified injury of left wrist, hand and finger(s), initial encounter: Secondary | ICD-10-CM | POA: Diagnosis present

## 2023-09-23 DIAGNOSIS — S63502A Unspecified sprain of left wrist, initial encounter: Secondary | ICD-10-CM | POA: Insufficient documentation

## 2023-09-23 DIAGNOSIS — M25532 Pain in left wrist: Secondary | ICD-10-CM | POA: Insufficient documentation

## 2023-09-23 DIAGNOSIS — W19XXXA Unspecified fall, initial encounter: Secondary | ICD-10-CM | POA: Diagnosis not present

## 2023-09-23 MED ORDER — HYDROCODONE-ACETAMINOPHEN 7.5-325 MG PO TABS: 1.0000 | ORAL_TABLET | Freq: Three times a day (TID) | ORAL | 0 refills | Status: AC | PRN

## 2023-09-23 MED ORDER — CYCLOBENZAPRINE HCL 5 MG PO TABS: 5.0000 mg | ORAL_TABLET | Freq: Three times a day (TID) | ORAL | 0 refills | Status: DC | PRN

## 2023-09-23 NOTE — ED Provider Notes (Signed)
[facility] Emergency Department Provider Note     Event Date/Time   First MD Initiated Contact with Patient 09/23/23 1500     (approximate)   History   Arm Pain   HPI  Brandon Henderson is a 67 y.o. male right-handed presents to the ED accompanied by his wife, for evaluation of ongoing and increased discomfort in the left wrist and forearm following a mechanical fall.  Patient reports a mechanical fall off of a two-tiered stepladder at home 2 days ago.  He fell backwards landing on his buttocks and back, and broke his fall with a outstretched left hand.  He presented to [facility] later that day for evaluation of his injuries which were primarily left dorsal wrist pain and discomfort.  He denies any head injury or LOC.  He denied any nausea or vomiting, chest pain, shortness of breath.  He endorses that the initial x-rays in the office were read is negative placed the patient in a Velcro wrist splint for precaution for his wrist sprain unable to completely rule out a carpal bone fracture.  He presents to the ED today, 2 days status post noting increased pain and swelling to the dorsal wrist.  He also endorses forearm pain with wrist flexion and extension.  Decreased grip strength, and bruising over the dorsal wrist.  He does use unable to don the Velcro wrist splint today due to the increased welling to his dorsal wrist.  Physical Exam   Triage Vital Signs: ED Triage Vitals  Encounter Vitals Group     BP 09/23/23 1357 121/63     Systolic BP Percentile --      Diastolic BP Percentile --      Pulse Rate 09/23/23 1357 66     Resp 09/23/23 1357 16     Temp 09/23/23 1357 98.3 F (36.8 C)     Temp Source 09/23/23 1357 Oral     SpO2 09/23/23 1357 97 %     Weight 09/23/23 1358 234 lb (106.1 kg)     Height 09/23/23 1358 5\' 7"  (1.702 m)     Head Circumference --      Peak Flow --      Pain Score --      Pain Loc --      Pain Education --      Exclude from  Growth Chart --     Most recent vital signs: Vitals:   09/23/23 1357  BP: 121/63  Pulse: 66  Resp: 16  Temp: 98.3 F (36.8 C)  SpO2: 97%    General Awake, no distress. NAD HEENT NCAT. PERRL. EOMI. No rhinorrhea. Mucous membranes are moist.  CV:  Good peripheral perfusion.  RESP:  Normal effort.  ABD:  No distention.  MSK:  Left wrist and forearm without obvious deformity or dislocation   ED Results / Procedures / Treatments   Labs (all labs ordered are listed, but only abnormal results are displayed) Labs Reviewed - No data to display   EKG    RADIOLOGY  I personally viewed and evaluated these images as part of my medical decision making, as well as reviewing the written report by the radiologist.  ED Provider Interpretation: no acute findings  DG Forearm Left  Result Date: 09/23/2023 CLINICAL DATA:  Fall, pain EXAM: LEFT FOREARM - 2 VIEW COMPARISON:  None Available. FINDINGS: Ossific fragments adjacent to the radial tuberosity concerning for biceps avulsion injury. No other evidence of fracture or dislocation of  the forearm. Soft tissues unremarkable. IMPRESSION: Ossific fragments adjacent to the radial tuberosity concerning for biceps avulsion injury. No other evidence of fracture or dislocation of the forearm. Electronically Signed   By: Jearld Lesch M.D.   On: 09/23/2023 14:44   DG Humerus Left  Result Date: 09/23/2023 CLINICAL DATA:  Trauma. EXAM: LEFT HUMERUS - 2 VIEW COMPARISON:  None Available. FINDINGS: No acute fracture, dislocation or subluxation. Soft tissue calcification proximal likely to chronic trauma. IMPRESSION: No acute osseous abnormalities. Electronically Signed   By: Layla Maw M.D.   On: 09/23/2023 14:36     PROCEDURES:  Critical Care performed: No  .Splint Application  Date/Time: 09/23/2023 4:04 PM  Performed by: Marilynn Rail, Paramedic Authorized by: Lissa Hoard, PA-C   Consent:    Consent obtained:   Verbal   Consent given by:  Patient   Risks, benefits, and alternatives were discussed: yes     Risks discussed:  Discoloration   Alternatives discussed:  No treatment Universal protocol:    Imaging studies available: yes     Site/side marked: yes     Patient identity confirmed:  Verbally with patient Pre-procedure details:    Distal neurologic exam:  Normal   Distal perfusion: distal pulses strong   Procedure details:    Location:  Wrist   Wrist location:  L wrist   Splint type:  Volar short arm   Supplies:  Cotton padding and fiberglass Post-procedure details:    Distal neurologic exam:  Normal   Distal perfusion: distal pulses strong     Procedure completion:  Tolerated well, no immediate complications   Post-procedure imaging: not applicable      MEDICATIONS ORDERED IN ED: Medications - No data to display   IMPRESSION / MDM / ASSESSMENT AND PLAN / ED COURSE  I reviewed the triage vital signs and the nursing notes.                              Differential diagnosis includes, but is not limited to, wrist fracture, wrist dislocation, TFCC, tendinitis, contusion, wrist sprain  Patient's presentation is most consistent with acute complicated illness / injury requiring diagnostic workup.  Patient's diagnosis is consistent with left wrist sprain.  No radiologic evidence of any acute fracture or dislocation.  Patient with reassuring exam overall.  Range of motion limited by subjective pain complaints.  Neurovascularly patient is intact.  No radiologic evidence of any acute fracture or dislocation based on interpretation.  Patient will be discharged home with prescriptions for cyclobenzaprine and hydrocodone. Patient is to follow up with [facility] as scheduled as needed or otherwise directed. Patient is given ED precautions to return to the ED for any worsening or new symptoms.   FINAL CLINICAL IMPRESSION(S) / ED DIAGNOSES   Final diagnoses:  Wrist sprain, left, initial  encounter     Rx / DC Orders   ED Discharge Orders          Ordered    HYDROcodone-acetaminophen (NORCO) 7.5-325 MG tablet  3 times daily PRN        09/23/23 1616    cyclobenzaprine (FLEXERIL) 5 MG tablet  3 times daily PRN        09/23/23 1616             Note:  This document was prepared using Dragon voice recognition software and may include unintentional dictation errors.    Kaavya Puskarich, Charlesetta Ivory,  PA-C 09/23/23 1807    Dionne Bucy, MD 09/23/23 1953

## 2023-09-23 NOTE — Discharge Instructions (Addendum)
Your exam and x-rays do not show any acute fractures or dislocations.  Your exam is consistent with a wrist sprain.  Wear the OCL brace as directed.  Apply ice to help reduce swelling and pain.  Completion pain medicine muscle relaxants as directed.  Follow-up with the hand specialist at [facility] as scheduled.

## 2023-09-23 NOTE — ED Triage Notes (Signed)
Pt states Thursday he was on the 2nd step of a ladder and fell off onto his back. Tried catching himself with his left arm. Pt arrives in a sling. [facility] and had xrays done and didn't show any breaks but patient states that he is now unable to sleep because of the pain that is increasing. Pt states the pain started in the wrist and now the pain is up his arm past his elbow.

## 2023-11-15 ENCOUNTER — Other Ambulatory Visit: Payer: Self-pay | Admitting: Internal Medicine

## 2023-11-15 DIAGNOSIS — Z87891 Personal history of nicotine dependence: Secondary | ICD-10-CM

## 2023-11-21 ENCOUNTER — Ambulatory Visit
Admission: RE | Admit: 2023-11-21 | Discharge: 2023-11-21 | Disposition: A | Discharge status: Home / Self Care | Payer: Medicare Other | Source: Ambulatory Visit | Attending: Internal Medicine | Admitting: Internal Medicine

## 2023-11-21 DIAGNOSIS — Z87891 Personal history of nicotine dependence: Secondary | ICD-10-CM | POA: Diagnosis present

## 2023-12-27 ENCOUNTER — Ambulatory Visit (INDEPENDENT_AMBULATORY_CARE_PROVIDER_SITE_OTHER): Payer: Self-pay | Admitting: Podiatry

## 2023-12-27 ENCOUNTER — Encounter: Payer: Self-pay | Admitting: Podiatry

## 2023-12-27 DIAGNOSIS — L603 Nail dystrophy: Secondary | ICD-10-CM | POA: Diagnosis not present

## 2023-12-27 DIAGNOSIS — G4733 Obstructive sleep apnea (adult) (pediatric): Secondary | ICD-10-CM | POA: Insufficient documentation

## 2023-12-27 DIAGNOSIS — F332 Major depressive disorder, recurrent severe without psychotic features: Secondary | ICD-10-CM | POA: Insufficient documentation

## 2023-12-27 DIAGNOSIS — F32A Depression, unspecified: Secondary | ICD-10-CM | POA: Insufficient documentation

## 2023-12-27 DIAGNOSIS — Z630 Problems in relationship with spouse or partner: Secondary | ICD-10-CM | POA: Insufficient documentation

## 2023-12-27 DIAGNOSIS — Z739 Problem related to life management difficulty, unspecified: Secondary | ICD-10-CM | POA: Insufficient documentation

## 2023-12-27 DIAGNOSIS — Z5181 Encounter for therapeutic drug level monitoring: Secondary | ICD-10-CM | POA: Insufficient documentation

## 2023-12-27 DIAGNOSIS — L600 Ingrowing nail: Secondary | ICD-10-CM

## 2023-12-27 DIAGNOSIS — R45851 Suicidal ideations: Secondary | ICD-10-CM | POA: Insufficient documentation

## 2023-12-27 MED ORDER — NEOMYCIN-POLYMYXIN-HC 1 % OT SOLN: OTIC | 1 refills | Status: DC

## 2023-12-27 NOTE — Patient Instructions (Signed)

## 2023-12-27 NOTE — Progress Notes (Signed)
 Subjective:  Patient ID: Brandon Henderson, male    DOB: [birth year],  MRN: [id number] HPI Chief Complaint  Patient presents with   Nail Problem    Toenails bilateral - thick, discolored nails x years, tender hallux bilateral - lateral borders   New Patient (Initial Visit)    68 y.o. male presents with the above complaint.   He presents today with his wife chief concern painful nails.  Particularly ingrown nail to the lateral borders of the hallux bilateral.  ROS: Denies fever chills nausea vomit muscle aches pains calf pain back pain chest pain shortness of breath.  Past Medical History:  Diagnosis Date   Anginal pain (HCC)    Arthritis    Barrett esophagus    Carotid arterial disease (HCC)    a. 2011 s/p R CEA.   Coronary artery disease    a. 2000 s/p PCI/BMS x 2 of OM2 [place], [place]); b. 12/2018 MV: EF 43% (likely 2/2 GI uptake artifact No ischemia. Low risk; c. 03/2019 PCI: LM nl, LAD 19m, LCX nl, OM2 90 ISR, RCA 40p, 51m (3.5x15 Resolute Onyx DES), RPL2 70. d. 06/04/2019 LHC 1. severe OM2 (suspected in stent restenosis), mild to mod dz LAD & RCA, patent mRCA stent, nl LV pressure, balloon angio to OM2 90p to 40p   Depression    Diabetes mellitus without complication (HCC)    Prediabetes   Diastasis recti    GERD (gastroesophageal reflux disease)    History of hiatal hernia    History of kidney stones    History of tobacco abuse    Hyperlipidemia LDL goal <70    Hypertension    Morbid obesity (HCC)    Osteoarthritis    Sleep apnea    Squamous cell carcinoma of left ear    Squamous cell carcinoma, arm, right    Past Surgical History:  Procedure Laterality Date   artoscopic rotator cuff repair     BACK SURGERY  1991   BIOPSY  05/12/2023   Procedure: BIOPSY;  Surgeon: Regis Bill, MD;  Location: [facility];  Service: Endoscopy;;   CARDIAC CATHETERIZATION     CAROTID ARTERY ANGIOPLASTY  2011   carotid endartarectomy     COLONOSCOPY WITH PROPOFOL N/A 06/12/2018    Procedure: COLONOSCOPY WITH PROPOFOL;  Surgeon: Toledo, Boykin Nearing, MD;  Location: [facility];  Service: Gastroenterology;  Laterality: N/A;   COLONOSCOPY WITH PROPOFOL N/A 05/12/2023   Procedure: COLONOSCOPY WITH PROPOFOL;  Surgeon: Regis Bill, MD;  Location: [facility];  Service: Endoscopy;  Laterality: N/A;   CORONARY ANGIOPLASTY     CORONARY BALLOON ANGIOPLASTY N/A 06/04/2019   Procedure: CORONARY BALLOON ANGIOPLASTY;  Surgeon: Yvonne Kendall, MD;  Location: [facility];  Service: Cardiovascular;  Laterality: N/A;   CORONARY STENT INTERVENTION N/A 04/23/2019   Procedure: CORONARY STENT INTERVENTION;  Surgeon: Yvonne Kendall, MD;  Location: [facility];  Service: Cardiovascular;  Laterality: N/A;   ESOPHAGOGASTRODUODENOSCOPY (EGD) WITH PROPOFOL N/A 06/12/2018   Procedure: ESOPHAGOGASTRODUODENOSCOPY (EGD) WITH PROPOFOL;  Surgeon: Toledo, Boykin Nearing, MD;  Location: [facility];  Service: Gastroenterology;  Laterality: N/A;   ESOPHAGOGASTRODUODENOSCOPY (EGD) WITH PROPOFOL N/A 05/12/2023   Procedure: ESOPHAGOGASTRODUODENOSCOPY (EGD) WITH PROPOFOL;  Surgeon: Regis Bill, MD;  Location: [facility];  Service: Endoscopy;  Laterality: N/A;   LEFT HEART CATH AND CORONARY ANGIOGRAPHY N/A 04/23/2019   Procedure: LEFT HEART CATH AND CORONARY ANGIOGRAPHY;  Surgeon: Yvonne Kendall, MD;  Location: [facility];  Service: Cardiovascular;  Laterality:  N/A;   LEFT HEART CATH AND CORONARY ANGIOGRAPHY Left 06/04/2019   Procedure: LEFT HEART CATH AND CORONARY ANGIOGRAPHY;  Surgeon: Yvonne Kendall, MD;  Location: [facility];  Service: Cardiovascular;  Laterality: Left;   Rotator cuff  2010    Current Outpatient Medications:    metoprolol succinate (TOPROL-XL) 100 MG 24 hr tablet, Take by mouth., Disp: , Rfl:    NEOMYCIN-POLYMYXIN-HYDROCORTISONE (CORTISPORIN) 1 % SOLN OTIC solution, Apply 1-2 drops to toe BID after soaking, Disp: 10 mL, Rfl: 1    Semaglutide,0.25 or 0.5MG /DOS, 2 MG/3ML SOPN, Inject into the skin., Disp: , Rfl:    sertraline (ZOLOFT) 100 MG tablet, Take by mouth., Disp: , Rfl:    apixaban (ELIQUIS) 5 MG TABS tablet, Take 5 mg by mouth 2 (two) times daily., Disp: , Rfl:    Ascorbic Acid (VITAMIN C) 1000 MG tablet, Take 1,000 mg by mouth at bedtime. , Disp: , Rfl:    atorvastatin (LIPITOR) 40 MG tablet, Take 1 tablet (40 mg total) by mouth daily. Please call to schedule an appointment for further refills. (Patient taking differently: Take 80 mg by mouth daily. Please call to schedule an appointment for further refills.), Disp: 90 tablet, Rfl: 0   clopidogrel (PLAVIX) 75 MG tablet, Take 1 tablet (75 mg total) by mouth daily with breakfast. (Patient taking differently: Take 75 mg by mouth daily.), Disp: 90 tablet, Rfl: 3   Coenzyme Q10 (COQ10) 200 MG CAPS, Take 200 mg by mouth at bedtime., Disp: , Rfl:    empagliflozin (JARDIANCE) 25 MG TABS tablet, Take 25 mg by mouth daily., Disp: , Rfl:    ezetimibe (ZETIA) 10 MG tablet, Take 5 mg by mouth daily., Disp: , Rfl:    isosorbide mononitrate (IMDUR) 30 MG 24 hr tablet, Take 1 tablet (30 mg total) by mouth daily. (Patient taking differently: Take 60 mg by mouth daily.), Disp: 90 tablet, Rfl: 3   magnesium oxide (MAG-OX) 400 MG tablet, Take 400 mg by mouth daily., Disp: , Rfl:    Multiple Vitamin (MULTIVITAMIN WITH MINERALS) TABS tablet, Take 1 tablet by mouth at bedtime., Disp: , Rfl:    nitroGLYCERIN (NITROSTAT) 0.4 MG SL tablet, Place 1 tablet (0.4 mg total) under the tongue every 5 (five) minutes as needed for chest pain. (Patient taking differently: Place 0.4 mg under the tongue as needed for chest pain.), Disp: 90 tablet, Rfl: 3   pantoprazole (PROTONIX) 40 MG tablet, Take 40 mg by mouth daily., Disp: , Rfl:    ramipril (ALTACE) 5 MG capsule, Take 1 capsule (5 mg total) by mouth daily. Please call to schedule appointment for further refills. (Patient taking differently: Take 5 mg  by mouth 2 (two) times daily. Please call to schedule appointment for further refills.), Disp: 90 capsule, Rfl: 0  Allergies  Allergen Reactions   Iodine Hives   Contrast Media [Iodinated Contrast Media] Rash   Metformin Diarrhea   Gadolinium Hives and Rash   Review of Systems Objective:  There were no vitals filed for this visit.  General: Well developed, nourished, in no acute distress, alert and oriented x3   Dermatological: Skin is warm, dry and supple bilateral. Nails x 10 are thick yellow dystrophic clinically mycotic; remaining integument appears unremarkable at this time. There are no open sores, no preulcerative lesions, no rash or signs of infection present.  Painful ingrown toenails to the fibular border of the hallux bilateral.  Mild erythema no purulence no malodor  Vascular: Dorsalis Pedis artery and  Posterior Tibial artery pedal pulses are 2/4 bilateral with immedate capillary fill time. Pedal hair growth present. No varicosities and no lower extremity edema present bilateral.   Neruologic: Grossly intact via light touch bilateral. Vibratory intact via tuning fork bilateral. Protective threshold with Semmes Wienstein monofilament intact to all pedal sites bilateral. Patellar and Achilles deep tendon reflexes 2+ bilateral. No Babinski or clonus noted bilateral.   Musculoskeletal: No gross boney pedal deformities bilateral. No pain, crepitus, or limitation noted with foot and ankle range of motion bilateral. Muscular strength 5/5 in all groups tested bilateral.  Severe hallux abductovalgus deformity with near complete dislocation first metatarsophalangeal joint of the right foot left foot hallux valgus deformity.  Gait: Unassisted, Nonantalgic.    Radiographs:  None taken  Assessment & Plan:   Assessment: Diabetes with no complications.  Ingrown toenails hallux bilateral.  Hallux valgus bilateral.  Right severe.  Plan: Chemical matricectomy was performed today to the  lateral border of the hallux bilateral nail plate.  Tolerated procedure well after local anesthetic was administered.  He was given both oral written home-going instruction for the care and soaking of the toe.  Surgicel was utilized to help with hemostasis due to his Eliquis.  Follow-up with him in 2 weeks.  Samples of the skin and nail were taken today for pathologic evaluation I will follow-up with him once those come in.     Maralyn Witherell T. [place], [place]

## 2024-01-03 ENCOUNTER — Telehealth: Payer: Self-pay | Admitting: Podiatry

## 2024-01-03 MED ORDER — CEPHALEXIN 500 MG PO CAPS: 500.0000 mg | ORAL_CAPSULE | Freq: Two times a day (BID) | ORAL | 0 refills | Status: DC

## 2024-01-03 NOTE — Telephone Encounter (Signed)
 had ingrown toenails removed last week 3/12 now having some swelling w/ redness R Foot 1st toe. Is it possible to have something called in for it. Pt uses MyChart and can upload a picture if needed.  (F/U appt already sch for 4/26)  Pharmacy: [facility] [address] [place] - [address] Phone: [phone]  Fax: [phone]

## 2024-01-10 ENCOUNTER — Ambulatory Visit (INDEPENDENT_AMBULATORY_CARE_PROVIDER_SITE_OTHER): Admitting: Podiatry

## 2024-01-10 ENCOUNTER — Encounter: Payer: Self-pay | Admitting: Podiatry

## 2024-01-10 DIAGNOSIS — Z9889 Other specified postprocedural states: Secondary | ICD-10-CM

## 2024-01-10 DIAGNOSIS — M109 Gout, unspecified: Secondary | ICD-10-CM | POA: Diagnosis not present

## 2024-01-10 DIAGNOSIS — L600 Ingrowing nail: Secondary | ICD-10-CM

## 2024-01-10 DIAGNOSIS — J849 Interstitial pulmonary disease, unspecified: Secondary | ICD-10-CM | POA: Insufficient documentation

## 2024-01-10 NOTE — Progress Notes (Signed)
 He presents today for nail check hallux bilaterally.  Says it is okay it is really not hurting the right was a little slower heal because of my bunion and second toe sits on top of it.  Continues to use the Betadine and warm water as well as Cortisporin otic drops.  Recently seen by the [place].  Ironically had diagnosed him with gout because of severe pain of the first metatarsophalangeal joint of the right foot and swelling and erythema as well as the third digit on the right foot.  He was prescribed colchicine and immediately improved.  Objective: Muscle stable alert oriented x 3 there is no erythema edema salines drainage or odor he has well-healing surgical sites fibular border hallux bilaterally.  Hallux abductovalgus deformity of the right foot currently does not demonstrate any signs of gout.  Assessment: Well-healing surgical toes.  History of gout immediately following surgical procedure.  Plan: Discussed etiology pathology conservative surgical therapies at this point I highly recommended that he continue Atasol is warm water soaks continue Cortisporin otic drops covered in the daytime but leave open to air at nighttime.  Should his gout recur or the redness swelling and pain to the first metatarsophalangeal joint or the third toe.  I instructed him to come in immediately that day whether to see me or any other doctor that might be here for a CBC and an arthritic panel including uric acid.

## 2024-02-17 ENCOUNTER — Encounter: Payer: Self-pay | Admitting: Podiatry

## 2024-03-13 ENCOUNTER — Encounter: Payer: Self-pay | Admitting: Podiatry

## 2024-03-13 ENCOUNTER — Ambulatory Visit (INDEPENDENT_AMBULATORY_CARE_PROVIDER_SITE_OTHER): Admitting: Podiatry

## 2024-03-13 DIAGNOSIS — L603 Nail dystrophy: Secondary | ICD-10-CM

## 2024-03-13 MED ORDER — TERBINAFINE HCL 250 MG PO TABS: 250.0000 mg | ORAL_TABLET | Freq: Every day | ORAL | 0 refills | Status: AC

## 2024-03-13 NOTE — Progress Notes (Signed)
 He presents today for follow-up of his nail pathology with his wife.  Objective: Vital signs are stable he is alert orient x 3 no change in physical exam.  If his toes are healing very nicely from his matrixectomy sites.   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   Assessment: Nail pathology indicates onychomycosis.  Plan: Discussed topical therapy laser therapy and oral therapy.  At this point he would like to try oral therapy.  We did discuss pros and cons of this medication the possible side effects associated with that he understands and is amendable to it.  We will start him on Lamisil 250 mg tablets.  He will take 1 tablet daily.  I will follow-up with him in 30 days for blood work.

## 2024-03-13 NOTE — Progress Notes (Signed)
 lamios

## 2024-04-12 ENCOUNTER — Inpatient Hospital Stay: Attending: Oncology

## 2024-04-12 ENCOUNTER — Other Ambulatory Visit: Payer: Medicare Other

## 2024-04-12 DIAGNOSIS — D472 Monoclonal gammopathy: Secondary | ICD-10-CM | POA: Diagnosis present

## 2024-04-12 LAB — CMP (CANCER CENTER ONLY)
ALT: 31 U/L (ref 0–44)
AST: 30 U/L (ref 15–41)
Albumin: 3.9 g/dL (ref 3.5–5.0)
Alkaline Phosphatase: 94 U/L (ref 38–126)
Anion gap: 11 (ref 5–15)
BUN: 19 mg/dL (ref 8–23)
CO2: 24 mmol/L (ref 22–32)
Calcium: 9 mg/dL (ref 8.9–10.3)
Chloride: 100 mmol/L (ref 98–111)
Creatinine: 1.2 mg/dL (ref 0.61–1.24)
GFR, Estimated: >60 mL/min (ref >60)
Glucose, Bld: 125 mg/dL — ABNORMAL HIGH (ref 70–99)
Potassium: 4.3 mmol/L (ref 3.5–5.1)
Sodium: 135 mmol/L (ref 135–145)
Total Bilirubin: 0.8 mg/dL (ref 0.0–1.2)
Total Protein: 7.2 g/dL (ref 6.5–8.1)

## 2024-04-12 LAB — CBC WITH DIFFERENTIAL (CANCER CENTER ONLY)
Abs Immature Granulocytes: 0.04 10*3/uL (ref 0.00–0.07)
Basophils Absolute: 0.1 10*3/uL (ref 0.0–0.1)
Basophils Relative: 1 %
Eosinophils Absolute: 0.2 10*3/uL (ref 0.0–0.5)
Eosinophils Relative: 3 %
HCT: 43 % (ref 39.0–52.0)
Hemoglobin: 13.4 g/dL (ref 13.0–17.0)
Immature Granulocytes: 0 %
Lymphocytes Relative: 15 %
Lymphs Abs: 1.5 10*3/uL (ref 0.7–4.0)
MCH: 25.7 pg — ABNORMAL LOW (ref 26.0–34.0)
MCHC: 31.2 g/dL (ref 30.0–36.0)
MCV: 82.5 fL (ref 80.0–100.0)
Monocytes Absolute: 0.9 10*3/uL (ref 0.1–1.0)
Monocytes Relative: 9 %
Neutro Abs: 6.9 10*3/uL (ref 1.7–7.7)
Neutrophils Relative %: 72 %
Platelet Count: 247 10*3/uL (ref 150–400)
RBC: 5.21 MIL/uL (ref 4.22–5.81)
RDW: 15.3 % (ref 11.5–15.5)
WBC Count: 9.5 10*3/uL (ref 4.0–10.5)
nRBC: 0 % (ref 0.0–0.2)

## 2024-04-15 LAB — MULTIPLE MYELOMA PANEL, SERUM
Albumin SerPl Elph-Mcnc: 3.7 g/dL (ref 2.9–4.4)
Albumin/Glob SerPl: 1.2 (ref 0.7–1.7)
Alpha 1: 0.2 g/dL (ref 0.0–0.4)
Alpha2 Glob SerPl Elph-Mcnc: 1 g/dL (ref 0.4–1.0)
B-Globulin SerPl Elph-Mcnc: 1.1 g/dL (ref 0.7–1.3)
Gamma Glob SerPl Elph-Mcnc: 0.7 g/dL (ref 0.4–1.8)
Globulin, Total: 3.1 g/dL (ref 2.2–3.9)
IgA: 202 mg/dL (ref 61–437)
IgG (Immunoglobin G), Serum: 810 mg/dL (ref 603–1613)
IgM (Immunoglobulin M), Srm: 34 mg/dL (ref 20–172)
Total Protein ELP: 6.8 g/dL (ref 6.0–8.5)

## 2024-04-17 LAB — KAPPA/LAMBDA LIGHT CHAINS
Kappa free light chain: 29 mg/L — ABNORMAL HIGH (ref 3.3–19.4)
Kappa, lambda light chain ratio: 1.39 (ref 0.26–1.65)
Lambda free light chains: 20.9 mg/L (ref 5.7–26.3)

## 2024-04-22 ENCOUNTER — Inpatient Hospital Stay: Payer: Medicare Other | Attending: Oncology | Admitting: Oncology

## 2024-04-22 ENCOUNTER — Encounter: Payer: Self-pay | Admitting: Oncology

## 2024-04-22 VITALS — BP 113/71 | HR 76 | Temp 98.5°F | Resp 16 | Wt 238.5 lb

## 2024-04-22 DIAGNOSIS — Z801 Family history of malignant neoplasm of trachea, bronchus and lung: Secondary | ICD-10-CM | POA: Insufficient documentation

## 2024-04-22 DIAGNOSIS — Z79899 Other long term (current) drug therapy: Secondary | ICD-10-CM | POA: Insufficient documentation

## 2024-04-22 DIAGNOSIS — Z8051 Family history of malignant neoplasm of kidney: Secondary | ICD-10-CM | POA: Insufficient documentation

## 2024-04-22 DIAGNOSIS — Z87891 Personal history of nicotine dependence: Secondary | ICD-10-CM | POA: Diagnosis not present

## 2024-04-22 DIAGNOSIS — Z807 Family history of other malignant neoplasms of lymphoid, hematopoietic and related tissues: Secondary | ICD-10-CM | POA: Diagnosis not present

## 2024-04-22 DIAGNOSIS — Z800 Family history of malignant neoplasm of digestive organs: Secondary | ICD-10-CM | POA: Insufficient documentation

## 2024-04-22 DIAGNOSIS — D472 Monoclonal gammopathy: Secondary | ICD-10-CM | POA: Insufficient documentation

## 2024-04-22 NOTE — Progress Notes (Signed)
 Patient denies new or acute problems/concerns today.

## 2024-04-22 NOTE — Progress Notes (Signed)
 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  Immunofixation was previously positive.' Light chain ratio is normal with slightly increased kappa free light chain and ratio.Brandon Henderson  Previous 24-hour UPEP is negative for M protein. Labs are reviewed and discussed with patient. Lab Results  Component Value Date   MPROTEIN Not Observed 04/12/2024   KPAFRELGTCHN 29.0 (H) 04/12/2024   LAMBDASER 20.9 04/12/2024   KAPLAMBRATIO 1.39 04/12/2024    immunofixation pattern appears unremarkable  Continue observation and repeat labs in 12 months.   Orders Placed This Encounter  Procedures   CBC with Differential (Cancer Center Only)    Standing Status:   Future    Expected Date:   04/22/2025    Expiration Date:   04/22/2025   CMP (Cancer Center only)    Standing Status:   Future    Expected Date:   04/22/2025    Expiration Date:   04/22/2025   Multiple Myeloma Panel (SPEP&IFE w/QIG)    Standing Status:   Future    Expected Date:   04/22/2025    Expiration Date:   04/22/2025   Kappa/lambda light chains    Standing Status:   Future    Expected Date:   04/22/2025    Expiration Date:   04/22/2025   Follow-up in 12 months All questions were answered. The patient knows to call the clinic with any problems, questions or concerns.  Call Babara, MD, PhD [facility] Hematology Oncology 04/22/2024    HISTORY OF PRESENTING ILLNESS:   Brandon Henderson is a  68 y.o.  male with PMH listed below was seen in consultation at the request of  Lenon Layman ORN, MD  for evaluation of abnormal  labs Patient has a history of chronic mild anemia. 02/22/2022, hemoglobin 12.2, MCV 84, normal platelet count and white count. Iron panel showed ferritin of 67, iron saturation 17 and TIBC 359. 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  Reviewed patient's previous blood work, patient has chronic anemia, dating back to at least 2021.  Hemoglobin 12s-13s Patient denies any unintentional weight loss, night sweats, fever. He drinks 1-2 beer, 5 days/week History of skin squamous cell carcinoma. History of angina I, coronary artery disease status post PCI/BMS.  Patient has paroxysmal atrial fibrillation patient takes apixaban.   INTERVAL HISTORY Brandon Henderson is a 68 y.o. male who has above history reviewed by me today presents for follow up visit  For MGUS He has no new complaints.  Accompanied by wife.   Review of Systems  Constitutional:  Negative for appetite change, chills, fatigue, fever and unexpected weight change.  HENT:   Negative for hearing loss and voice change.   Eyes:  Negative for eye problems and icterus.  Respiratory:  Negative for chest tightness, cough and shortness of breath.   Cardiovascular:  Negative for chest pain and leg swelling.  Gastrointestinal:  Negative for abdominal distention and abdominal pain.  Endocrine: Negative for hot flashes.  Genitourinary:  Negative for difficulty urinating, dysuria and frequency.   Musculoskeletal:  Negative  for arthralgias.  Skin:  Negative for itching and rash.  Neurological:  Negative for light-headedness and numbness.  Hematological:  Negative for adenopathy. Does not bruise/bleed easily.  Psychiatric/Behavioral:  Negative for confusion.     MEDICAL HISTORY:  Past Medical History:  Diagnosis Date   Anginal pain (HCC)    Arthritis    Barrett esophagus    Carotid arterial disease (HCC)    a. 2011 s/p R CEA.   Coronary artery  disease    a. 2000 s/p PCI/BMS x 2 of OM2 [place]); b. 12/2018 MV: EF 43% (likely 2/2 GI uptake artifact No ischemia. Low risk; c. 03/2019 PCI: LM nl, LAD 90m, LCX nl, OM2 90 ISR, RCA 40p, 34m (3.5x15 Resolute Onyx DES), RPL2 70. d. 06/04/2019 LHC 1. severe OM2 (suspected in stent restenosis), mild to mod dz LAD & RCA, patent mRCA stent, nl LV pressure, balloon angio to OM2 90p to 40p   Depression    Diabetes mellitus without complication (HCC)    Prediabetes   Diastasis recti    GERD (gastroesophageal reflux disease)    History of hiatal hernia    History of kidney stones    History of tobacco abuse    Hyperlipidemia LDL goal <70    Hypertension    Morbid obesity (HCC)    Osteoarthritis    Sleep apnea    Squamous cell carcinoma of left ear    Squamous cell carcinoma, arm, right     SURGICAL HISTORY: Past Surgical History:  Procedure Laterality Date   artoscopic rotator cuff repair     BACK SURGERY  1991   BIOPSY  05/12/2023   Procedure: BIOPSY;  Surgeon: Maryruth Ole DASEN, MD;  Location: [facility];  Service: Endoscopy;;   CARDIAC CATHETERIZATION     CAROTID ARTERY ANGIOPLASTY  2011   carotid endartarectomy     COLONOSCOPY WITH PROPOFOL  N/A 06/12/2018   Procedure: COLONOSCOPY WITH PROPOFOL ;  Surgeon: Toledo, Ladell POUR, MD;  Location: [facility];  Service: Gastroenterology;  Laterality: N/A;   COLONOSCOPY WITH PROPOFOL  N/A 05/12/2023   Procedure: COLONOSCOPY WITH PROPOFOL ;  Surgeon: Maryruth Ole DASEN, MD;  Location: [facility];  Service: Endoscopy;  Laterality: N/A;   CORONARY ANGIOPLASTY     CORONARY BALLOON ANGIOPLASTY N/A 06/04/2019   Procedure: CORONARY BALLOON ANGIOPLASTY;  Surgeon: Mady Bruckner, MD;  Location: [facility];  Service: Cardiovascular;  Laterality: N/A;   CORONARY STENT INTERVENTION N/A 04/23/2019   Procedure: CORONARY STENT INTERVENTION;  Surgeon: Mady Bruckner, MD;  Location: [facility];  Service: Cardiovascular;   Laterality: N/A;   ESOPHAGOGASTRODUODENOSCOPY (EGD) WITH PROPOFOL  N/A 06/12/2018   Procedure: ESOPHAGOGASTRODUODENOSCOPY (EGD) WITH PROPOFOL ;  Surgeon: Toledo, Ladell POUR, MD;  Location: [facility];  Service: Gastroenterology;  Laterality: N/A;   ESOPHAGOGASTRODUODENOSCOPY (EGD) WITH PROPOFOL  N/A 05/12/2023   Procedure: ESOPHAGOGASTRODUODENOSCOPY (EGD) WITH PROPOFOL ;  Surgeon: Maryruth Ole DASEN, MD;  Location: [facility];  Service: Endoscopy;  Laterality: N/A;   LEFT HEART CATH AND CORONARY ANGIOGRAPHY N/A 04/23/2019   Procedure: LEFT HEART CATH AND CORONARY ANGIOGRAPHY;  Surgeon: Mady Bruckner, MD;  Location: [facility];  Service: Cardiovascular;  Laterality: N/A;   LEFT HEART CATH AND CORONARY ANGIOGRAPHY Left 06/04/2019   Procedure: LEFT HEART CATH AND CORONARY ANGIOGRAPHY;  Surgeon: Mady Bruckner, MD;  Location: [facility];  Service: Cardiovascular;  Laterality: Left;   Rotator cuff  2010    SOCIAL HISTORY: Social History   Socioeconomic History   Marital status: Married  Spouse name: Not on file   Number of children: Not on file   Years of education: Not on file   Highest education level: Not on file  Occupational History   Not on file  Tobacco Use   Smoking status: Former    Current packs/day: 0.00    Average packs/day: 1.5 packs/day for 40.0 years (60.0 ttl pk-yrs)    Types: Cigarettes, E-cigarettes    Start date: 10/17/1978    Quit date: 10/17/2018    Years since quitting: 5.5   Smokeless tobacco: Never   Tobacco comments:    quit vaping 10/2018. quit smoking 2016  Vaping Use   Vaping status: Former   Quit date: 10/25/2018  Substance and Sexual Activity   Alcohol use: Yes    Alcohol/week: 4.0 standard drinks of alcohol    Types: 1 Glasses of wine, 3 Cans of beer per week    Comment: 7-14 cans of beer/week   Drug use: Not Currently    Types: Marijuana    Comment: in the past   Sexual activity: Not Currently  Other Topics Concern   Not on  file  Social History Narrative   Lives locally.  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  Does not routinely exercise.   Social Drivers of Corporate investment banker Strain: Low Risk  (02/22/2024)   Received from [facility]   Overall Financial Resource Strain (CARDIA)    Difficulty of Paying Living Expenses: Not very hard  Food Insecurity: No Food Insecurity (02/22/2024)   Received from [facility]   Hunger Vital Sign    Within the past 12 months, you worried that your food would run out before you got the money to buy more.: Never true    Within the past 12 months, the food you bought just didn't last and you didn't have money to get more.: Never true  Transportation Needs: No Transportation Needs (02/22/2024)   Received from [facility] - Transportation    In the past 12 months, has lack of transportation kept you from medical appointments or from getting medications?: No    Lack of Transportation (Non-Medical): No  Physical Activity: Not on file  Stress: Not on file  Social Connections: Not on file  Intimate Partner Violence: Not At Risk (06/04/2019)   Humiliation, Afraid, Rape, and Kick questionnaire    Fear of Current or Ex-Partner: No    Emotionally Abused: No    Physically Abused: No    Sexually Abused: No    FAMILY HISTORY: Family History  Problem Relation Age of Onset   Heart attack Mother    Stroke Father    Colon cancer Sister    Lung cancer Sister    Kidney cancer Brother    Non-Hodgkin's lymphoma Brother    Colon cancer Brother     ALLERGIES:  is allergic to iodine, contrast media [iodinated contrast media], metformin, and gadolinium.  MEDICATIONS:  Current Outpatient Medications  Medication Sig Dispense Refill   apixaban (ELIQUIS) 5 MG TABS tablet Take 5 mg by mouth 2 (two) times daily.     Ascorbic Acid (VITAMIN C) 1000 MG tablet Take 1,000 mg by mouth at bedtime.      atorvastatin  (LIPITOR) 40 MG tablet Take 1 tablet  (40 mg total) by mouth daily. Please call to schedule an appointment for further refills. 90 tablet 0   clopidogrel  (PLAVIX ) 75 MG tablet Take 1 tablet (75 mg total) by mouth daily with  breakfast. 90 tablet 3   Coenzyme Q10 (COQ10) 200 MG CAPS Take 200 mg by mouth at bedtime.     colchicine 0.6 MG tablet Take by mouth.     empagliflozin (JARDIANCE) 25 MG TABS tablet Take 25 mg by mouth daily.     ezetimibe  (ZETIA ) 10 MG tablet Take 5 mg by mouth daily.     isosorbide  mononitrate (IMDUR ) 30 MG 24 hr tablet Take 1 tablet (30 mg total) by mouth daily. (Patient taking differently: Take 60 mg by mouth daily.) 90 tablet 3   magnesium  oxide (MAG-OX) 400 MG tablet Take 400 mg by mouth daily.     metoprolol  succinate (TOPROL -XL) 100 MG 24 hr tablet Take by mouth.     Multiple Vitamin (MULTIVITAMIN WITH MINERALS) TABS tablet Take 1 tablet by mouth at bedtime.     NEOMYCIN -POLYMYXIN-HYDROCORTISONE (CORTISPORIN) 1 % SOLN OTIC solution Apply 1-2 drops to toe BID after soaking 10 mL 1   nitroGLYCERIN  (NITROSTAT ) 0.4 MG SL tablet Place 1 tablet (0.4 mg total) under the tongue every 5 (five) minutes as needed for chest pain. 90 tablet 3   pantoprazole  (PROTONIX ) 40 MG tablet Take 40 mg by mouth daily.     ramipril  (ALTACE ) 5 MG capsule Take 1 capsule (5 mg total) by mouth daily. Please call to schedule appointment for further refills. (Patient taking differently: Take 5 mg by mouth 2 (two) times daily. Please call to schedule appointment for further refills.) 90 capsule 0   Semaglutide,0.25 or 0.5MG /DOS, 2 MG/3ML SOPN Inject into the skin.     sertraline (ZOLOFT) 100 MG tablet Take by mouth.     terbinafine  (LAMISIL ) 250 MG tablet Take 1 tablet (250 mg total) by mouth daily. 30 tablet 0   gabapentin (NEURONTIN) 100 MG capsule Take 300 mg by mouth. (Patient not taking: Reported on 04/22/2024)     No current facility-administered medications for this visit.     PHYSICAL EXAMINATION:  Vitals:   04/22/24 1014   BP: 113/71  Pulse: 76  Resp: 16  Temp: 98.5 F (36.9 C)   Filed Weights   04/22/24 1014  Weight: 238 lb 8 oz (108.2 kg)    Physical Exam Constitutional:      General: He is not in acute distress.    Appearance: He is obese.  HENT:     Head: Normocephalic and atraumatic.  Eyes:     General: No scleral icterus. Cardiovascular:     Rate and Rhythm: Normal rate.  Pulmonary:     Effort: Pulmonary effort is normal. No respiratory distress.  Abdominal:     General: Bowel sounds are normal. There is no distension.     Palpations: Abdomen is soft.  Musculoskeletal:        General: No deformity. Normal range of motion.     Cervical back: Normal range of motion.  Skin:    Findings: No erythema or rash.  Neurological:     Mental Status: He is alert and oriented to person, place, and time. Mental status is at baseline.  Psychiatric:        Mood and Affect: Mood normal.     LABORATORY DATA:  I have reviewed the data as listed    Latest Ref Rng & Units 04/12/2024    8:05 AM 04/14/2023    1:10 PM 10/06/2022    9:53 AM  CBC  WBC 4.0 - 10.5 K/uL 9.5  7.5  7.0   Hemoglobin 13.0 - 17.0 g/dL 86.5  12.8  13.3   Hematocrit 39.0 - 52.0 % 43.0  39.5  41.1   Platelets 150 - 400 K/uL 247  236  243       Latest Ref Rng & Units 04/12/2024    8:06 AM 04/14/2023    1:10 PM 10/06/2022    9:53 AM  CMP  Glucose 70 - 99 mg/dL 874  899  874   BUN 8 - 23 mg/dL 19  20  26    Creatinine 0.61 - 1.24 mg/dL 8.79  9.04  9.06   Sodium 135 - 145 mmol/L 135  136  137   Potassium 3.5 - 5.1 mmol/L 4.3  4.3  4.6   Chloride 98 - 111 mmol/L 100  102  102   CO2 22 - 32 mmol/L 24  24  26    Calcium  8.9 - 10.3 mg/dL 9.0  8.6  8.9   Total Protein 6.5 - 8.1 g/dL 7.2  6.8  7.0   Total Bilirubin 0.0 - 1.2 mg/dL 0.8  0.5  0.6   Alkaline Phos 38 - 126 U/L 94  88  65   AST 15 - 41 U/L 30  24  24    ALT 0 - 44 U/L 31  23  20       RADIOGRAPHIC STUDIES: I have personally reviewed the radiological images as  listed and agreed with the findings in the report. No results found.

## 2024-04-22 NOTE — Assessment & Plan Note (Addendum)
 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  Immunofixation was previously positive.' Light chain ratio is normal with slightly increased kappa free light chain and ratio.SABRA  Previous 24-hour UPEP is negative for M protein. Labs are reviewed and discussed with patient. Lab Results  Component Value Date   MPROTEIN Not Observed 04/12/2024   KPAFRELGTCHN 29.0 (H) 04/12/2024   LAMBDASER 20.9 04/12/2024   KAPLAMBRATIO 1.39 04/12/2024    immunofixation pattern appears unremarkable  Continue observation and repeat labs in 12 months.

## 2024-10-28 ENCOUNTER — Ambulatory Visit: Admitting: Podiatry

## 2024-10-28 DIAGNOSIS — M79676 Pain in unspecified toe(s): Secondary | ICD-10-CM

## 2024-10-28 DIAGNOSIS — B351 Tinea unguium: Secondary | ICD-10-CM

## 2024-10-28 DIAGNOSIS — L600 Ingrowing nail: Secondary | ICD-10-CM

## 2024-10-28 MED ORDER — NEOMYCIN-POLYMYXIN-HC 1 % OT SOLN: OTIC | 1 refills | Status: AC

## 2024-10-28 NOTE — Progress Notes (Signed)
 He presents today with his wife chief concern of his ingrown toenail to the fibular border of the hallux right.  He states that I would know whether to have my bunion and hammertoe corrected so that will have to go through this matrixectomy every few months or do I just have the nail worked on.  Also my toenails are too thick for me to cut could you please cut them.  Objective: Vital signs are stable oriented x 3 pulses are palpable.  Severe HAV deformity and hammertoe deformity resulting in compression of the lateral border of the hallux nail plate right into a ingrown toenail.  There is mild erythema tenderness on palpation no purulence no malodor.  His toenails are thick yellow dystrophic clinically mycotic.  Assessment: Pain in limb secondary to ingrown toenail fibular border hallux right hallux valgus deformities hammertoe deformities and painful onychomycosis.  Plan: Chemical matricectomy was performed to the fibular border of the hallux right today after local anesthetic was administered.  25% of the nail was resected from the lateral side of the toe exposing the matrix which was analyzed 3 applications 30 seconds each and then neutralized with isopropyl alcohol.  Was given both oral and written home-going structure for the care and soaking of the toe as well as a prescription for Cortisporin Otic to be applied twice daily after soaking.  All toenails 1 through 5 bilaterally were cut debrided in thickness and length today.  Follow-up with him in 3 weeks

## 2024-10-28 NOTE — Patient Instructions (Signed)

## 2024-11-20 ENCOUNTER — Ambulatory Visit (INDEPENDENT_AMBULATORY_CARE_PROVIDER_SITE_OTHER): Admitting: Podiatry

## 2024-11-20 DIAGNOSIS — L600 Ingrowing nail: Secondary | ICD-10-CM

## 2024-11-20 NOTE — Progress Notes (Signed)
 He presents today for nail check regarding his fibular border of the hallux right great matricectomy was performed last visit.  That was October 28, 2024.  He states that is doing great is no longer tender.  Objective: Vital signs are stable alert oriented x 3.  There is no erythema edema cellulitis drainage odor appears to be healing very nicely there is eschar present.  Assessment: Well-healing surgical toe fibular border hallux right hallux valgus deformity and hammertoe deformity second.  Plan: I would continue to soak every other day just for about another week or 2 just to make sure that is good and dried out suggested that they soak with Epsom salts and warm water cover during the day with a Band-Aid leave open at bedtime.  Follow-up with me with any questions or concerns

## 2025-04-22 ENCOUNTER — Other Ambulatory Visit

## 2025-05-01 ENCOUNTER — Ambulatory Visit: Admitting: Oncology
